# Patient Record
Sex: Male | Born: 1945
Health system: Southern US, Community
[De-identification: ages and names within clinical notes are randomized; demographics above are authoritative.]

## PROBLEM LIST (undated history)

## (undated) DIAGNOSIS — I10 Essential (primary) hypertension: Secondary | ICD-10-CM

## (undated) DIAGNOSIS — B9681 Helicobacter pylori [H. pylori] as the cause of diseases classified elsewhere: Secondary | ICD-10-CM

## (undated) DIAGNOSIS — D689 Coagulation defect, unspecified: Secondary | ICD-10-CM

## (undated) DIAGNOSIS — K222 Esophageal obstruction: Secondary | ICD-10-CM

## (undated) DIAGNOSIS — K2270 Barrett's esophagus without dysplasia: Secondary | ICD-10-CM

## (undated) DIAGNOSIS — K219 Gastro-esophageal reflux disease without esophagitis: Secondary | ICD-10-CM

## (undated) DIAGNOSIS — K2970 Gastritis, unspecified, without bleeding: Secondary | ICD-10-CM

## (undated) DIAGNOSIS — Z86010 Personal history of colonic polyps: Secondary | ICD-10-CM

## (undated) DIAGNOSIS — Z5189 Encounter for other specified aftercare: Secondary | ICD-10-CM

## (undated) DIAGNOSIS — D649 Anemia, unspecified: Secondary | ICD-10-CM

## (undated) DIAGNOSIS — Z800 Family history of malignant neoplasm of digestive organs: Secondary | ICD-10-CM

## (undated) DIAGNOSIS — K5790 Diverticulosis of intestine, part unspecified, without perforation or abscess without bleeding: Secondary | ICD-10-CM

## (undated) DIAGNOSIS — E785 Hyperlipidemia, unspecified: Secondary | ICD-10-CM

## (undated) DIAGNOSIS — C801 Malignant (primary) neoplasm, unspecified: Secondary | ICD-10-CM

## (undated) HISTORY — DX: Gastritis, unspecified, without bleeding: K29.70

## (undated) HISTORY — DX: Coagulation defect, unspecified: D68.9

## (undated) HISTORY — DX: Malignant (primary) neoplasm, unspecified: C80.1

## (undated) HISTORY — DX: Barrett's esophagus without dysplasia: K22.70

## (undated) HISTORY — DX: Hyperlipidemia, unspecified: E78.5

## (undated) HISTORY — DX: Gastro-esophageal reflux disease without esophagitis: K21.9

## (undated) HISTORY — DX: Essential (primary) hypertension: I10

## (undated) HISTORY — PX: HERNIA REPAIR: SHX51

## (undated) HISTORY — DX: Helicobacter pylori (H. pylori) as the cause of diseases classified elsewhere: B96.81

## (undated) HISTORY — PX: VASECTOMY: SHX75

## (undated) HISTORY — PX: EYE SURGERY: SHX253

## (undated) HISTORY — PX: COLON SURGERY: SHX602

## (undated) HISTORY — DX: Encounter for other specified aftercare: Z51.89

## (undated) HISTORY — DX: Diverticulosis of intestine, part unspecified, without perforation or abscess without bleeding: K57.90

## (undated) HISTORY — DX: Family history of malignant neoplasm of digestive organs: Z80.0

## (undated) HISTORY — DX: Esophageal obstruction: K22.2

## (undated) HISTORY — DX: Personal history of colonic polyps: Z86.010

---

## 2004-01-28 ENCOUNTER — Ambulatory Visit (HOSPITAL_COMMUNITY): Admission: RE | Admit: 2004-01-28 | Discharge: 2004-01-28 | Payer: Self-pay | Admitting: Internal Medicine

## 2009-02-23 ENCOUNTER — Encounter: Payer: Self-pay | Admitting: Internal Medicine

## 2009-02-28 DIAGNOSIS — Z860101 Personal history of adenomatous and serrated colon polyps: Secondary | ICD-10-CM

## 2009-02-28 DIAGNOSIS — Z86010 Personal history of colonic polyps: Secondary | ICD-10-CM

## 2009-02-28 HISTORY — DX: Personal history of colonic polyps: Z86.010

## 2009-02-28 HISTORY — DX: Personal history of adenomatous and serrated colon polyps: Z86.0101

## 2009-03-02 ENCOUNTER — Ambulatory Visit (HOSPITAL_COMMUNITY): Admission: RE | Admit: 2009-03-02 | Discharge: 2009-03-02 | Payer: Self-pay | Admitting: Internal Medicine

## 2009-03-02 ENCOUNTER — Encounter: Payer: Self-pay | Admitting: Internal Medicine

## 2009-03-02 ENCOUNTER — Ambulatory Visit: Payer: Self-pay | Admitting: Internal Medicine

## 2009-03-03 ENCOUNTER — Encounter: Payer: Self-pay | Admitting: Internal Medicine

## 2009-11-28 DIAGNOSIS — K222 Esophageal obstruction: Secondary | ICD-10-CM

## 2009-11-28 DIAGNOSIS — B9681 Helicobacter pylori [H. pylori] as the cause of diseases classified elsewhere: Secondary | ICD-10-CM

## 2009-11-28 DIAGNOSIS — K5790 Diverticulosis of intestine, part unspecified, without perforation or abscess without bleeding: Secondary | ICD-10-CM

## 2009-11-28 DIAGNOSIS — K2270 Barrett's esophagus without dysplasia: Secondary | ICD-10-CM

## 2009-11-28 HISTORY — DX: Esophageal obstruction: K22.2

## 2009-11-28 HISTORY — DX: Barrett's esophagus without dysplasia: K22.70

## 2009-11-28 HISTORY — DX: Helicobacter pylori (H. pylori) as the cause of diseases classified elsewhere: B96.81

## 2009-11-28 HISTORY — DX: Diverticulosis of intestine, part unspecified, without perforation or abscess without bleeding: K57.90

## 2009-12-15 ENCOUNTER — Encounter: Payer: Self-pay | Admitting: Internal Medicine

## 2009-12-16 ENCOUNTER — Ambulatory Visit (HOSPITAL_COMMUNITY): Admission: RE | Admit: 2009-12-16 | Discharge: 2009-12-16 | Payer: Self-pay | Admitting: Internal Medicine

## 2009-12-16 ENCOUNTER — Telehealth (INDEPENDENT_AMBULATORY_CARE_PROVIDER_SITE_OTHER): Payer: Self-pay | Admitting: *Deleted

## 2009-12-16 ENCOUNTER — Ambulatory Visit: Payer: Self-pay | Admitting: Internal Medicine

## 2009-12-21 ENCOUNTER — Encounter (INDEPENDENT_AMBULATORY_CARE_PROVIDER_SITE_OTHER): Payer: Self-pay

## 2009-12-22 ENCOUNTER — Telehealth (INDEPENDENT_AMBULATORY_CARE_PROVIDER_SITE_OTHER): Payer: Self-pay

## 2009-12-22 ENCOUNTER — Encounter: Payer: Self-pay | Admitting: Internal Medicine

## 2009-12-22 DIAGNOSIS — K2270 Barrett's esophagus without dysplasia: Secondary | ICD-10-CM | POA: Insufficient documentation

## 2009-12-22 DIAGNOSIS — A048 Other specified bacterial intestinal infections: Secondary | ICD-10-CM | POA: Insufficient documentation

## 2010-01-24 ENCOUNTER — Encounter: Payer: Self-pay | Admitting: Internal Medicine

## 2010-02-14 ENCOUNTER — Ambulatory Visit: Payer: Self-pay | Admitting: Internal Medicine

## 2010-04-05 ENCOUNTER — Encounter: Payer: Self-pay | Admitting: Gastroenterology

## 2010-04-06 ENCOUNTER — Encounter: Payer: Self-pay | Admitting: Gastroenterology

## 2010-08-30 NOTE — Medication Information (Signed)
Summary: NEXIUM 40MG   NEXIUM 40MG    Imported By: Rexene Alberts 04/05/2010 08:53:19  _____________________________________________________________________  External Attachment:    Type:   Image     Comment:   External Document  Appended Document: NEXIUM 40MG , change to prevacid    Prescriptions: LANSOPRAZOLE 30 MG CPDR (LANSOPRAZOLE) one by mouth 30 mins before breakfast daily  #30 x 11   Entered and Authorized by:   Leanna Battles. Dixon Boos   Signed by:   Leanna Battles Dixon Boos on 04/05/2010   Method used:   Electronically to        [facility] (retail)       [address]       [place], [place]  [address]       Ph: [phone]       Fax: [phone]   RxID:   [id number]

## 2010-08-30 NOTE — Progress Notes (Signed)
Summary: Patient's wife wanted to communicate cell phone info/MM  Phone Note Call from Patient Call back at Home Phone [phone]   Caller: Spouse Details for Reason: returning information requested by Dr. Jena Gauss Summary of Call: Ms. Littlefield called with the two numbers you requested as they would be out of town and she wanted me to pass them along to you. His cell: [phone] Her cell: [phone] Initial call taken by: Minna Merritts,  Dec 16, 2009 4:32 PM

## 2010-08-30 NOTE — Letter (Signed)
Summary: EGD ORDER  EGD ORDER   Imported By: Ave Filter 12/15/2009 10:01:10  _____________________________________________________________________  External Attachment:    Type:   Image     Comment:   External Document

## 2010-08-30 NOTE — Miscellaneous (Signed)
Summary: path report  Clinical Lists Changes SP-Surgical Pathology - STATUS: Final  .                                         Perform Date: 3 Aug10 00:01  Ordered By: Jena Gauss MD , Gerrit Friends           Ordered Date: 3 Aug10 13:36  Facility: [facility]                               Department: [facility]  Service Report Text  [address] [place], [place] [address]   [phone]    REPORT OF SURGICAL PATHOLOGY    Case #: [id number]   Patient Name: David Weiss, David B.   Office Chart Number: N/A    MRN: [id number]   Pathologist: Lyn Hollingshead. Delila Spence, MD   DOB/Age [birth year] (Age: 65) Gender: M   Date Taken: 03/02/2009   Date Received: 03/02/2009    FINAL DIAGNOSIS    ***MICROSCOPIC EXAMINATION AND DIAGNOSIS***    1. ASCENDING COLON, POLYP(S): ADENOMATOUS POLYP(S). NO HIGH   GRADE DYSPLASIA OR INVASIVE MALIGNANCY IDENTIFIED (4 FRAGMENTS).    2. CECUM, POLYP: ADENOMATOUS POLYP. NO HIGH GRADE DYSPLASIA OR   INVASIVE MALIGNANCY IDENTIFIED.    3. DESCENDING COLON, POLYP(S): ADENOMATOUS POLYP(S). NO HIGH   GRADE DYSPLASIA OR INVASIVE MALIGNANCY IDENTIFIED.    mw   Date Reported: 03/03/2009 Alden Server A. Delila Spence, MD   *** Electronically Signed Out By EAA ***    Clinical information   Polyps (km)    specimen(s) obtained   1: Colon, polyp(s), ascending   2: Colon, polyp(s), cecal   3: Colon, polyp(s), descending    Gross Description   1. Received in formalin are tan, soft tissue fragments that are   submitted in toto. Number: 4   Size: 0.2 to 0.4 cm    2. Received in formalin is a tan, soft tissue fragment that is   submitted in toto. Size: 0.3 cm    3. Received in formalin are tan, soft tissue fragments that are   submitted in toto. Number: 2   Size: 0.1 and 0.2 cm (GP:mj 03/02/09)    mj/    COLPOL-Colon, polyp(s), ascending   COLPOL-Colon, polyp(s), cecal   COLPOL-Colon, polyp(s), descending   1. Received in formalin are tan, soft tissue fragments that are   submitted in toto. Number: 4  Size: 0.2 to 0.4 cm    2. Received in formalin is a tan, soft tissue fragment that is   submitted in toto. Size: 0.3 cm    3. Received in formalin are tan, soft tissue fragments that are   submitted in toto. Number: 2   Size: 0.1 and 0.2 cm (GP:mj 03/02/09)   1. ASCENDING COLON, POLYP(S): ADENOMATOUS POLYP(S). NO HIGH   GRADE DYSPLASIA OR INVASIVE MALIGNANCY IDENTIFIED (4 FRAGMENTS).    2. CECUM, POLYP: ADENOMATOUS POLYP. NO HIGH GRADE DYSPLASIA OR   INVASIVE MALIGNANCY IDENTIFIED.    3. DESCENDING COLON, POLYP(S): ADENOMATOUS POLYP(S). NO HIGH   GRADE DYSPLASIA OR INVASIVE MALIGNANCY IDENTIFIED.  Additional Information  HL7 RESULT STATUS : F  External IF Update Timestamp : 2009-03-02:13:36:00.000000

## 2010-08-30 NOTE — Progress Notes (Signed)
Summary: hpylori rx  Phone Note Other Incoming   Caller: RMR Summary of Call: per RMR pt has hpylori and barretts esp. pt needs prevpac x 14 days (can break into separate rx's of biaxin, amox and prevacid). RMR has already informed pt and asked him to stop his protonix during treatment. Rx called into [facility] to [place], [phone][place] per RMR pt informed him that he was taking lotrel and lipitor and has NKDA. pt needs ov in 01/2010 and repeat egd in 1 year. Initial call taken by: Hendricks Limes LPN,  Dec 22, 2009 4:23 PM     Appended Document: hpylori rx pt aware of July appt and reminder in computer for procedure

## 2010-08-30 NOTE — Medication Information (Signed)
Summary: OMEPRAZOLE 20MG   OMEPRAZOLE 20MG    Imported By: Rexene Alberts 04/06/2010 08:10:47  _____________________________________________________________________  External Attachment:    Type:   Image     Comment:   External Document  Appended Document: omeprazole    Prescriptions: OMEPRAZOLE 20 MG CPDR (OMEPRAZOLE) one by mouth 30 mins before breakfast daily  #30 x 11   Entered and Authorized by:   Leanna Battles. Dixon Boos   Signed by:   Leanna Battles Dixon Boos on 04/08/2010   Method used:   Electronically to        [facility] (retail)       [address]       [place], [place]  [address]       Ph: [phone]       Fax: [phone]   RxID:   [id number]    PLEASE MAKE SURE PATIENT HAS BEEN NIC FOR REPEAT EGD 5/12

## 2010-08-30 NOTE — Letter (Signed)
Summary: [facility]  [facility]   Imported By: Ave Filter 12/15/2009 10:01:59  _____________________________________________________________________  External Attachment:    Type:   Image     Comment:   External Document

## 2010-08-30 NOTE — Assessment & Plan Note (Signed)
Summary: pp fu in July per RMR/ss   Visit Type:  Follow-up Visit Primary Care Provider:  Ouida Sills  Chief Complaint:  F/U .  History of Present Illness: Patient is here for f/u. Recent EGD (5/11) showed Schatzki ring, H. Pylori gastritis, Barrett's esophagus. He completed treatment for H.Pylori. He has been on pantoprazole since EGD in 5/11. He states he has had diffuse itching and rash of upper torso for the past several weeks. Only new meds were pantoprazole, biaxin, amoxicillin. Abx completed over one month ago. He denies heartburn. Rarely had it in past, would take prevacid OTC as needed only. He also c/o worsening gout and brings article stating gout can be flared by pantoprazole. Denies dysphagia, abd pain, constipation, diarrhea, melena, brbpr.  Current Medications (verified): 1)  Hydroxyzine Hcl 25 Mg Tabs (Hydroxyzine Hcl) .... As Needed 2)  Lipitor 20 Mg Tabs (Atorvastatin Calcium) .... 1/2 Tablet Daily 3)  Amlodipine Besy-Benazepril Hcl 5-10 Mg Caps (Amlodipine Besy-Benazepril Hcl) .... Take 1 Tablet By Mouth Once A Day 4)  Clobetasol Propionate 0.05 % Crea (Clobetasol Propionate) .... As Directed 5)  Claritin 10 Mg Tabs (Loratadine) .... As Needed 6)  Vitamin D .... Take 1 Tablet By Mouth Once A Day 7)  Fish Oil 1200 Mg .... Take 1 Tablet By Mouth Once A Day  Allergies (verified): 1)  ! Pantoprazole Sodium  Past History:  Past Medical History: GERD Hyperlipidemia Hypertension EGD 5/11, done for drop in Hgb. Bx proven Barrett's. Schatzki ring s/p dilation, multiple antral bulbar and D2 erosions (likely source of bleeding in the setting of multiple NSAID drug use), bx showed H. Pylori (treated) TCS 8/10--> Left-sided diverticula. Adenomatous polyps.  Review of Systems      See HPI  Vital Signs:  Patient profile:   65 year old male Height:      69 inches Weight:      183.50 pounds BMI:     27.20 Temp:     98.1 degrees F oral Pulse rate:   64 / minute BP sitting:   142  / 58  (left arm) Cuff size:   regular  Vitals Entered By: Cloria Spring LPN (February 14, 2010 10:08 AM)  Physical Exam  General:  Well developed, well nourished, no acute distress. Head:  Normocephalic and atraumatic. Eyes:  sclera nonicteric Mouth:  op moist Abdomen:  soft Neurologic:  Alert and  oriented x4;  grossly normal neurologically. Skin:  Intact without significant lesions or rashes. Psych:  Alert and cooperative. Normal mood and affect.  Impression & Recommendations:  Problem # 1:  HELICOBACTER PYLORI GASTRITIS (ICD-041.86)  Completed treatment.  Orders: Est. Patient Level II (40102)  Problem # 2:  BARRETTS ESOPHAGUS (ICD-530.85)  Written information provided. Answered all questions. Stressed importance of daily PPI even though he is assymptomatic. Stop pantoprazole due to suspected adverse reaction. Begin Nexium 40 mg by mouth daily in one week. Samples and RX provided. If itching does not clear up, he should let PCP know. EGD in 11/2010 for surveillance of Barrett's esophagus.   Orders: Est. Patient Level II (72536) Prescriptions: NEXIUM 40 MG CPDR (ESOMEPRAZOLE MAGNESIUM) one by mouth 30 mins before breakfast daily  #30 x 11   Entered and Authorized by:   Leanna Battles. Dixon Boos   Signed by:   Leanna Battles Lewis PA-C on 02/14/2010   Method used:   Print then Give to Patient   RxID:   [id number]

## 2010-12-13 NOTE — Op Note (Signed)
NAME:  David Weiss, David Weiss                 ACCOUNT NO.:  [id number]   MEDICAL RECORD NO.:  [id number]          PATIENT TYPE:  AMB   LOCATION:  DAY                           FACILITY:  [facility]   PHYSICIAN:  R. Roetta Sessions, M.D. DATE OF BIRTH:  May 03, 1946   DATE OF PROCEDURE:  DATE OF DISCHARGE:                               OPERATIVE REPORT   PROCEDURES:  Colonoscopy with biopsy and snare polypectomy.   INDICATIONS FOR PROCEDURE:  A 65 year old gentleman with positive family  history of colon cancer in first degree relative at a young age as well  as a personal history of colonic adenomas removed at colonoscopy 5 years  ago.  He is devoid of any lower GI tract symptoms.  He is here for  surveillance.  Risks, benefits, alternatives and limitations have been  discussed, questions answered.  Please see the documention in medical  record.   PROCEDURE NOTE:  O2 saturation, blood pressure, pulse and respirations  monitored through the entire procedure.  Conscious sedation Versed 5 mg  IV Demerol 100 grams IV in divided doses.   INSTRUMENT USED:  Pentax video chip system.   FINDINGS:  Digital rectal exam revealed no abnormalities.  The prep was  good colon:  Colonic mucosa was surveyed from the rectosigmoid junction  through the left transverse right colon, the appendiceal orifice,  ileocecal valve and cecum.  These structures well seen and photographed  for the record.  From this level the scope was slowly withdrawn.  The  previously mentioned mucosal surfaces were again seen.  The patient had  left-sided diverticula.  In the mid ascending segment, There was a 5-mm  pedunculated polyp.  It was cold snared removed.  In the base the cecum  there was a single diminutive polyps which was cold biopsied/removed.  In the mid descending segment there was a another single diminutive  polyp which was cold biopsied/removed.  The remainder of colonic mucosa  appeared unremarkable.  Scope was pulled down the  rectum where thorough  examination of the rectal mucosa including retroflexion in the anal  verge demonstrated no abnormality.  The patient tolerated the rest of  procedure well and reacted in endoscopy.  Cecal withdrawal time 11  minutes.   IMPRESSION:  1. Normal rectum.  2. Left-sided diverticula.  3,  Single diminutive polyp in the descending colon as well as the  cecum.  Cold biopsied/removed.  1. Pedunculated polyp mid ascending colon status post cold snare      removal.  2. Remainder colonic mucosa appeared unremarkable.  3. Normal rectum.   RECOMMENDATIONS:  1. Diverticulosis polyp literature provided to Mr. Frigon.  2. Follow-up on path.  3. Further recommendations to follow.      [name], M.D.  Electronically Signed     RMR/MEDQ  D:  03/02/2009  T:  03/02/2009  Job:  [id number]   cc:   Kingsley Callander. Ouida Sills, MD  Fax: [phone]

## 2010-12-16 NOTE — Op Note (Signed)
NAME:  Rayborn, Montre B                           ACCOUNT NO.:  [id number]   MEDICAL RECORD NO.:  [id number]                   PATIENT TYPE:  AMB   LOCATION:  DAY                                  FACILITY:  [facility]   PHYSICIAN:  R. Roetta Sessions, M.D.              DATE OF BIRTH:  1946/02/07   DATE OF PROCEDURE:  01/28/2004  DATE OF DISCHARGE:                                 OPERATIVE REPORT   PROCEDURES:  1. Colonoscopy with biopsy.  2. Snare polypectomy.   INDICATIONS FOR PROCEDURE:  Patient is a 65 year old gentleman sent over at  the courtesy of Dr. Carylon Perches for colorectal cancer screening.  He has had a  sigmoidoscopy approximately 5 years ago without significant findings.  He is  devoid of any GI tract symptoms.  He has never had a full colonoscopy.  His  family history is now significant in that his brother had what he describes  as a transanal resection for limited stage rectal cancer down at [facility], [place].  He is doing well.  The  colonoscopy is now being done as a high risk screening maneuver.  The  approach has been discussed with the patient at length.  The potential  risks, benefits and alternatives have been reviewed, questions answered.  He  is agreeable.  Please see my handwritten H&P.   PROCEDURE NOTE:  The O2 saturation, blood pressure, pulse and respirations  were monitored throughout the entire procedure.   CONSCIOUS SEDATION:  1. Versed 5 mg IV.  2. Demerol 75 IV in divided doses.   INSTRUMENT:  Olympus video chip system.   FINDINGS:  Digital rectal examination revealed no abnormalities.   ENDOSCOPIC FINDINGS:  Prep was good.   Rectum:  Examination of the rectal mucosa including retroflexed view of the  anal verge revealed no abnormalities.   Colon:  Colonic mucosa was surveyed from the rectosigmoid junction through  the left transverse and right colon to the appendiceal orifice, ileocecal  valve and cecum.   These structures were well seen and photographed for the  record.  From this level the scope was slowly withdrawn.  All previously  mentioned mucosal surfaces were again seen.  The patient was noted to have:  1. A fairly extensive left-sided diverticula.  2. A 6 mm pedunculated polyp at 45 cm, removed with a cold snare.  There was     a second 3 mm polyp at 35 cm which was cold biopsied.  These polyps were     recovered through the scope.  The remainder of the colonic mucosa     appeared normal.  The patient tolerated the procedure well, was reactive     to endoscopy.   IMPRESSION:  1. Normal rectum.  2. Left-sided diverticula, small polyps in the left colon removed as     described above.  Remainder of colonic mucosa  appeared normal.   RECOMMENDATIONS:  1. Diverticulosis literature provided to Mr. Chaikin.  2. Followup on path.  3. Further recommendations to follow.      ___________________________________________                                            [name], M.D.   RMR/MEDQ  D:  01/28/2004  T:  01/28/2004  Job:  [id number]   cc:   Kingsley Callander. Ouida Sills, M.D.  [address]  [place]  [place] [address]  Fax: [phone]

## 2010-12-28 ENCOUNTER — Encounter: Payer: Self-pay | Admitting: Urgent Care

## 2011-01-03 ENCOUNTER — Encounter: Payer: Self-pay | Admitting: Urgent Care

## 2011-01-03 ENCOUNTER — Ambulatory Visit (INDEPENDENT_AMBULATORY_CARE_PROVIDER_SITE_OTHER): Payer: Medicare Other | Admitting: Urgent Care

## 2011-01-03 VITALS — BP 134/65 | HR 83 | Temp 98.4°F | Ht 68.0 in | Wt 187.2 lb

## 2011-01-03 DIAGNOSIS — A048 Other specified bacterial intestinal infections: Secondary | ICD-10-CM

## 2011-01-03 DIAGNOSIS — K2270 Barrett's esophagus without dysplasia: Secondary | ICD-10-CM

## 2011-01-03 DIAGNOSIS — D126 Benign neoplasm of colon, unspecified: Secondary | ICD-10-CM

## 2011-01-03 DIAGNOSIS — K222 Esophageal obstruction: Secondary | ICD-10-CM

## 2011-01-03 DIAGNOSIS — Q393 Congenital stenosis and stricture of esophagus: Secondary | ICD-10-CM

## 2011-01-03 DIAGNOSIS — Z800 Family history of malignant neoplasm of digestive organs: Secondary | ICD-10-CM | POA: Insufficient documentation

## 2011-01-03 NOTE — Patient Instructions (Signed)
Continue omeprazole 20mg   Colonoscopy 02/2014 or sooner if any problems  Barrett's Esophagus The esophagus is the muscular tube that carries food and saliva from the mouth to the stomach. Barrett's esophagus involves changes in the esophagus. Some of its lining is replaced by a type of tissue similar to that found in the intestine. This process is called intestinal metaplasia. While Barrett's esophagus may cause no symptoms itself, a small number of people with this condition develop a relatively rare but often deadly type of cancer of the esophagus. It is called esophageal adenocarcinoma. Barrett's esophagus is associated with the common condition called GERD (gastroesophageal reflux disease). HOW THE ESOPHAGUS WORKS The esophagus carries food, liquids, and saliva from the mouth to the stomach. The stomach acts as a container to start digestion and pump food and liquids into the intestines in a controlled process. Food can then be properly digested over time. Nutrients can be taken in (absorbed) by the intestines. The esophagus moves food to the stomach by coordinated contractions of its muscular lining. This process is automatic. People are usually not aware of it. Many people have felt their esophagus when they:  Swallow something too large.   Try to eat too quickly.   Drink very hot or cold liquids.  They then feel the movement of the food or drink down the esophagus into the stomach. This may be an uncomfortable feeling. DIGESTIVE TRACT The muscular layers of the esophagus are normally pinched together at both the upper and lower ends by muscles. These muscles are called sphincters. When a person swallows, the sphincters relax automatically. This allows food or drink to pass from the mouth, into the stomach. The muscles then close rapidly. This prevents the swallowed food or drink from leaking out of the stomach, back into the esophagus or into the mouth. These muscles make it possible to swallow  while lying down or even upside-down. When people belch to release swallowed air or gas from carbonated beverages, the sphincters relax. Then small amounts of food or drink may come back up, briefly. This condition is called reflux. The esophagus quickly squeezes the material back into the stomach. This is considered normal. These functions of the esophagus are an important part of everyday life. However, people who must have their esophagus removed, for example because of cancer, can live a relatively healthy life without it. GERD (GASTROESOPHAGEAL REFLUX DISEASE) Having some stomach contents (liquids or gas) sometimes reflux (come back up from the stomach into the esophagus) is considered normal. When it happens often, and causes other symptoms, it is considered a medical problem or disease. However, it is not necessarily a serious one or one that requires seeing a caregiver. The stomach produces acid and enzymes to digest food. When this mixture refluxes into the esophagus more often than normal or for a longer period of time than normal, it may produce symptoms. These symptoms are often called acid reflux. They are often described by people as heartburn, indigestion, or "gas". The symptoms often consist of a burning sensation below and behind the lower part of the breastbone or sternum. Almost everyone has experienced these symptoms at least once. This is typically a result of overeating. Other things that provoke GERD symptoms include:  Being overweight.   Eating certain types of foods.   Being pregnant.  In most people, GERD symptoms may last only a short time and require no treatment at all.  More continual symptoms are often quickly relieved by over-the-counter acid-reducing agents, such  as antacids. Other drugs used to relieve GERD symptoms are antisecretory drugs, such as histamine2 (H2) blockers or proton pump inhibitors. People who have symptoms often should talk with their caregiver.  Other diseases can have similar symptoms. Prescription medicines, together with other actions, might be needed to reduce reflux. GERD that is untreated over a long period can lead to problems. An example is an ulcer in the esophagus, that could cause bleeding. Another common problem is scar tissue that blocks the movement of swallowed food and drink through the esophagus. This condition is called stricture. Esophageal reflux may also cause certain less common symptoms. These include hoarseness or chronic cough. It sometimes provokes conditions such as asthma. While most patients find that lifestyle changes and acid-blocking drugs relieve their symptoms, caregivers sometimes advise surgery. Overall, GERD is one of the most common medical conditions. About 20 percent of the population can be affected over a lifetime. GERD AND BARRETT'S ESOPHAGUS The exact causes of Barrett's esophagus are not known. It is thought to be caused in part by the same factors that cause GERD. People who do not have heartburn can have Barrett's esophagus. However, it is found about 3 to 5 times more often in people with this condition. Barrett's esophagus is uncommon in children. The average age at diagnosis is 69. But it is usually difficult to know when the problem started. It is about twice as common in men as in women. It is much more common in white men than in men of other races. BARRETT'S ESOPHAGUS AND CANCER OF THE ESOPHAGUS Barrett's esophagus does not cause symptoms itself. However, it seems to precede the development of a particular kind of cancer. This cancer is esophageal adenocarcinoma. The risk of developing this cancer is 30 to 125 times higher in people who have Barrett's esophagus than in people who do not. This type of cancer is increasing quickly in white men. The increase is possibly related to the rise in obesity and GERD. For people who have Barrett's esophagus, the risk of getting cancer of the esophagus is  small. It is less than 1 percent (0.4 percent to 0.5 percent) per year. Esophageal adenocarcinoma is often not curable. This is partly because the disease is often discovered at a late stage and treatments are not effective. DIAGNOSIS (HOW TO TELL WHAT IS WRONG) AND SCREENING Diagnosing Barrett's esophagus is not easy. At the present time it cannot be diagnosed based on symptoms, physical exam, or blood tests. The only useful test is upper gastrointestinal endoscopy and biopsy. In this procedure, a flexible tube called an endoscope is used. This tool is like a pencil sized flexible telescope. It has a light and tiny camera. It is passed into the esophagus. If the tissue appears suspicious to your caregiver, biopsies must be done. A biopsy is the removal of a small piece of tissue. This is done using a pincher-like device passed through the endoscope. A pathologist is a specialist who examines body tissue samples. He or she examines the tissue under a microscope to confirm the diagnosis. Looking for a medical problem in people who do not know whether they have one is called screening. Currently, there are no commonly accepted guidelines on who should have endoscopy, to check for Barrett's esophagus. There are many reasons for the lack of firm recommendations about screening. Among them are the great cost and occasional risk of side effects of the test. Also, the rate of finding Barrett's esophagus is low. Finding the problem early has  not been proven to prevent deaths from cancer. Many caregivers advise that adult patients who are over the age of 47 and have had GERD symptoms for a number of years have endoscopy, to see whether they have Barrett's esophagus. Screening for this condition in people who have no symptoms is not advised. TREATMENT Barrett's esophagus has no cure, other than surgical removal of the esophagus. This is a serious operation. Surgery is advised only for people who have a high risk of  developing cancer or who already have it. Most caregivers recommend treating GERD with acid-blocking drugs. This is sometimes linked to improvement in the extent of the Barrett's tissue. But this approach has not been proven to reduce the risk of cancer. Treating reflux with surgery for GERD also does not seem to cure Barrett's esophagus. Several experimental approaches are under study. One attempts to see whether destroying the Barrett's tissue by heat or other means, through an endoscope, can get rid of the condition. But this approach has potential risks and unknown effectiveness. LOOKING FOR DYSPLASIA AND CANCER Occasional endoscopic examinations to look for early warning signs of cancer are generally advised for people who have Barrett's esophagus. This approach is called surveillance. When people who have Barrett's esophagus develop cancer, the process seems to go through an intermediate stage. In this stage cancer cells appear in the Barrett's tissue. This condition is called dysplasia. It can be seen only in biopsies with a microscope. The process is patchy and cannot be seen directly through the endoscope. So, multiple biopsies must be taken. Even then, it can be missed. The process of change from Barrett's to cancer seems to happen only in a few patients. It is less than 1 percent per year. And it happens over a relatively long period of time. Most caregivers advise that patients with Barrett's esophagus undergo occasional endoscopy to have biopsies. The recommended time between endoscopies varies depending on circumstances. The best time interval has not been decided. TREATMENT FOR DYSPLASIA OR ESOPHAGEAL ADENOCARCINOMA If a person with Barrett's esophagus is found to have dysplasia or cancer, the caregiver will usually recommend surgery. This is if the person is strong enough and has a good chance of being cured. The type of surgery may vary. But it usually involves removing most of the esophagus  and pulling the stomach up into the chest to attach it to what remains of the esophagus. Many patients with Barrett's esophagus are elderly. They may have many other medical problems that make surgery unwise. In these patients, other methods to treat dysplasia are being studied. HOPE THROUGH RESEARCH Many important questions about Barrett's esophagus need further research to:  Find better ways to identify people who have the problem.   Find out what causes it.   Test treatments that may prevent or get rid of it.   Find better treatments for people who have Barrett's esophagus with cancer.  The [employer], and the [employer], sponsor research programs to study Barrett's esophagus. IMPORTANT POINTS TO REMEMBER  In Barrett's esophagus, the cells lining the esophagus change. They become similar to the cells lining the intestine.   Barrett's esophagus is connected with gastroesophageal reflux disease or GERD.   A small number of people with Barrett's esophagus may develop esophageal cancer.   Barrett's esophagus is diagnosed by upper gastrointestinal endoscopy and biopsy.   People who have Barrett's esophagus should have periodic esophageal exams.   Taking acid-blocking drugs for GERD  may help improve Barrett's esophagus.   Removal of the esophagus is recommended only for people who have a high risk of developing cancer or who already have it.  ADDITIONAL RESOURCES For more information about GERD or Barrett's esophagus, contact: [employer] [address] [place], [place] [address] Phone: [phone] or [phone] Fax: [phone] Email: iffgd@iffgd .org Internet: www.iffgd.[facility] [employer] Information Clearinghouse [address] [place], [place] [address] Email: nddic@info .[employer].si Document Released: 10/07/2003 Document  Re-Released: 01/04/2010 [facility] Patient Information 2011 [place], [place] daily

## 2011-01-03 NOTE — Assessment & Plan Note (Signed)
Due for surveillance.  On omeprazole 20mg  daily.  Doing well.  EGD w/ biopsies w/ Dr Jena Gauss.  I have discussed risks & benefits which include, but are not limited to, bleeding, infection, perforation & drug reaction.  The patient agrees with this plan & written consent will be obtained.

## 2011-01-03 NOTE — Assessment & Plan Note (Signed)
Next colonoscopy 02/2014

## 2011-01-03 NOTE — Progress Notes (Signed)
Primary Care Physician:  Carylon Perches, MD Primary Gastroenterologist:  Dr. Jena Gauss  Chief Complaint  Patient presents with  . EGD    Barrett's esophagus    HPI:  David Weiss is a 65 y.o. male here for follow up for Barrett's esophagus.  Hx bx proven Barrett's diagnosed on EGD by Dr Jena Gauss 5/11 for GI bleed.  Findings included h pylori & NSAID-induced gastritis/duodenitis s/p treatment.  Doing very well.  Denies any upper GI symptoms including heartburn, indigestion, nausea, vomiting, dysphagia, odynophagia or anorexia.   Denies any lower GI symptoms including constipation, diarrhea, rectal bleeding, melena or weight loss.  On omeprazole 20mg  daily. Past Medical History  Diagnosis Date  . Barrett's esophagus 5/11    EGD Dr. Edgardo Roys Barrett's, multiple antral/bulbar erosins, D2 erosion sec NSAIDs  . GERD (gastroesophageal reflux disease)   . HTN (hypertension)   . Hyperlipemia   . Helicobacter pylori gastritis 5/11  . Diverticulosis 5/11    Colonoscopy Dr Jena Gauss  . Hx of adenomatous colonic polyps 5/11    Colonoscopy Dr. Jena Gauss  . Family hx of colon cancer     No past surgical history on file. Denies any.  Current Outpatient Prescriptions  Medication Sig Dispense Refill  . amLODipine (NORVASC) 5 MG tablet Take 5 mg by mouth daily.        . benazepril (LOTENSIN) 10 MG tablet Take 10 mg by mouth daily.        . Cholecalciferol (VITAMIN D3) 400 UNITS CAPS Take 3 capsules by mouth daily.        . fish oil-omega-3 fatty acids 1000 MG capsule Take 1 g by mouth daily.        Marland Kitchen LIPITOR 20 MG tablet 20 mg. 1/2 pill a day      . omeprazole (PRILOSEC) 20 MG capsule Take 20 mg by mouth daily.       Marland Kitchen DISCONTD: citalopram (CELEXA) 20 MG tablet         Allergies as of 01/03/2011 - Review Complete 01/03/2011  Allergen Reaction Noted  . Pantoprazole sodium  02/14/2010    Family History:   Problem Relation Age of Onset  . Prostate cancer Father 36  . Diabetes Mother   . Heart failure  Mother   . Colon cancer Brother 1    History   Social History  . Marital Status: Married    Spouse Name: N/A    Number of Children: 2  . Years of Education: N/A   Occupational History  . retired     Airline pilot   Social History Main Topics  . Smoking status: Former Smoker -- 1.5 packs/day for 30 years    Types: Cigarettes    Quit date: 03/18/1981  . Smokeless tobacco: Not on file  . Alcohol Use: Yes     two drinks a day bourbon  . Drug Use: No  . Sexually Active: Not on file   Other Topics Concern  . Not on file   Social History Narrative   Lives w/ wife    Review of Systems: Gen: Denies any fever, chills, sweats, anorexia, fatigue, weakness, malaise, weight loss, and sleep disorder CV: Denies chest pain, angina, palpitations, syncope, orthopnea, PND, peripheral edema, and claudication. Resp: Denies dyspnea at rest, dyspnea with exercise, cough, sputum, wheezing, coughing up blood, and pleurisy. GI: Denies vomiting blood, jaundice, and fecal incontinence.   Denies dysphagia or odynophagia. Derm: Denies rash, itching, dry skin, hives, moles, warts, or unhealing ulcers.  Psych: Denies depression, anxiety,  memory loss, suicidal ideation, hallucinations, paranoia, and confusion. Heme: Denies bruising, bleeding, and enlarged lymph nodes.  Physical Exam: BP 134/65  Pulse 83  Temp(Src) 98.4 F (36.9 C) (Temporal)  Ht 5\' 8"  (1.727 m)  Wt 187 lb 3.2 oz (84.913 kg)  BMI 28.46 kg/m2 General:   Alert,  Well-developed, well-nourished, pleasant and cooperative in NAD Head:  Normocephalic and atraumatic. Eyes:  Sclera clear, no icterus.   Conjunctiva pink. Mouth:  No deformity or lesions, dentition normal. Neck:  Supple; no masses or thyromegaly. Heart:  Regular rate and rhythm; no murmurs, clicks, rubs,  or gallops. Abdomen:  Soft, nontender and nondistended. No masses, hepatosplenomegaly or hernias noted. Normal bowel sounds, without guarding, and without rebound.   Msk:   Symmetrical without gross deformities. Normal posture. Pulses:  Normal pulses noted. Extremities:  Without clubbing or edema. Neurologic:  Alert and  oriented x4;  grossly normal neurologically. Skin:  Intact without significant lesions or rashes. Cervical Nodes:  No significant cervical adenopathy. Psych:  Alert and cooperative. Normal mood and affect.    '

## 2011-01-04 NOTE — Progress Notes (Signed)
Cc to PCP 

## 2011-01-06 ENCOUNTER — Ambulatory Visit: Payer: Self-pay | Admitting: Urgent Care

## 2011-01-06 ENCOUNTER — Ambulatory Visit (HOSPITAL_COMMUNITY)
Admission: RE | Admit: 2011-01-06 | Discharge: 2011-01-06 | Disposition: A | Discharge status: Home / Self Care | Payer: Medicare Other | Source: Ambulatory Visit | Attending: Internal Medicine | Admitting: Internal Medicine

## 2011-01-06 ENCOUNTER — Encounter: Payer: Medicare Other | Admitting: Internal Medicine

## 2011-01-06 ENCOUNTER — Other Ambulatory Visit: Payer: Self-pay | Admitting: Internal Medicine

## 2011-01-06 DIAGNOSIS — Z09 Encounter for follow-up examination after completed treatment for conditions other than malignant neoplasm: Secondary | ICD-10-CM | POA: Insufficient documentation

## 2011-01-06 DIAGNOSIS — K449 Diaphragmatic hernia without obstruction or gangrene: Secondary | ICD-10-CM

## 2011-01-06 DIAGNOSIS — K2270 Barrett's esophagus without dysplasia: Secondary | ICD-10-CM | POA: Insufficient documentation

## 2011-01-06 DIAGNOSIS — Z79899 Other long term (current) drug therapy: Secondary | ICD-10-CM | POA: Insufficient documentation

## 2011-01-06 DIAGNOSIS — Z860100 Personal history of colon polyps, unspecified: Secondary | ICD-10-CM | POA: Insufficient documentation

## 2011-01-06 DIAGNOSIS — I10 Essential (primary) hypertension: Secondary | ICD-10-CM | POA: Insufficient documentation

## 2011-01-06 DIAGNOSIS — E785 Hyperlipidemia, unspecified: Secondary | ICD-10-CM | POA: Insufficient documentation

## 2011-01-13 ENCOUNTER — Ambulatory Visit: Payer: Self-pay | Admitting: Urgent Care

## 2011-02-13 ENCOUNTER — Encounter: Payer: BLUE CROSS/BLUE SHIELD | Admitting: Internal Medicine

## 2011-02-13 ENCOUNTER — Ambulatory Visit (HOSPITAL_COMMUNITY)
Admission: RE | Admit: 2011-02-13 | Discharge: 2011-02-13 | Disposition: A | Discharge status: Home / Self Care | Payer: Medicare Other | Source: Ambulatory Visit | Attending: Internal Medicine | Admitting: Internal Medicine

## 2011-02-13 ENCOUNTER — Telehealth: Payer: Self-pay | Admitting: Gastroenterology

## 2011-02-13 ENCOUNTER — Encounter (HOSPITAL_COMMUNITY)
Admission: RE | Disposition: A | Discharge status: Home / Self Care | Payer: Self-pay | Source: Ambulatory Visit | Attending: Internal Medicine

## 2011-02-13 DIAGNOSIS — K2270 Barrett's esophagus without dysplasia: Secondary | ICD-10-CM | POA: Insufficient documentation

## 2011-02-13 DIAGNOSIS — Z79899 Other long term (current) drug therapy: Secondary | ICD-10-CM | POA: Insufficient documentation

## 2011-02-13 DIAGNOSIS — K921 Melena: Secondary | ICD-10-CM

## 2011-02-13 DIAGNOSIS — I10 Essential (primary) hypertension: Secondary | ICD-10-CM | POA: Insufficient documentation

## 2011-02-13 DIAGNOSIS — K449 Diaphragmatic hernia without obstruction or gangrene: Secondary | ICD-10-CM

## 2011-02-13 DIAGNOSIS — R197 Diarrhea, unspecified: Secondary | ICD-10-CM | POA: Insufficient documentation

## 2011-02-13 HISTORY — PX: ESOPHAGOGASTRODUODENOSCOPY: SHX5428

## 2011-02-13 LAB — HEMOGLOBIN AND HEMATOCRIT, BLOOD
HCT: 30 % — ABNORMAL LOW (ref 39.0–52.0)
Hemoglobin: 10 g/dL — ABNORMAL LOW (ref 13.0–17.0)

## 2011-02-13 SURGERY — EGD (ESOPHAGOGASTRODUODENOSCOPY)
Anesthesia: Moderate Sedation

## 2011-02-13 MED ORDER — STERILE WATER FOR IRRIGATION IR SOLN
Status: DC | PRN
  Administered 2011-02-13: 14:00:00

## 2011-02-13 MED ORDER — OMEPRAZOLE 20 MG PO CPDR: 20.0000 mg | DELAYED_RELEASE_CAPSULE | ORAL | Status: DC

## 2011-02-13 MED ORDER — SODIUM CHLORIDE 0.45 % IV SOLN
Freq: Once | INTRAVENOUS | Status: AC
  Administered 2011-02-13: 13:00:00 via INTRAVENOUS

## 2011-02-13 MED ORDER — BUTAMBEN-TETRACAINE-BENZOCAINE 2-2-14 % EX AERO
INHALATION_SPRAY | CUTANEOUS | Status: DC | PRN
  Administered 2011-02-13: 2 via TOPICAL

## 2011-02-13 MED ORDER — MIDAZOLAM HCL 5 MG/5ML IJ SOLN
INTRAMUSCULAR | Status: AC
  Filled 2011-02-13: qty 10

## 2011-02-13 MED ORDER — MEPERIDINE HCL 100 MG/ML IJ SOLN
INTRAMUSCULAR | Status: AC
  Filled 2011-02-13: qty 1

## 2011-02-13 MED ORDER — MIDAZOLAM HCL 5 MG/5ML IJ SOLN
INTRAMUSCULAR | Status: DC | PRN
  Administered 2011-02-13 (×2): 1 mg via INTRAVENOUS
  Administered 2011-02-13 (×2): 2 mg via INTRAVENOUS

## 2011-02-13 MED ORDER — MEPERIDINE HCL 100 MG/ML IJ SOLN
INTRAMUSCULAR | Status: DC | PRN
  Administered 2011-02-13 (×2): 50 mg via INTRAVENOUS

## 2011-02-13 NOTE — Op Note (Signed)
  [name], David Weiss                 ACCOUNT NO.:  [id number]  MEDICAL RECORD NO.:  [id number]  LOCATION:  [facility]                          FACILITY:  [facility]  PHYSICIAN:  R. Roetta Sessions, MD FACP FACGDATE OF BIRTH:  05-21-1946  DATE OF PROCEDURE:  01/06/2011 DATE OF DISCHARGE:                              OPERATIVE REPORT   PROCEDURE:  EGD with esophageal biopsy.  INDICATIONS FOR PROCEDURE:  A 64 year old gentleman with a history of Barrett's esophagus on prior EGD 1 year ago.  He was also found to have H. pylori gastritis and was treated.  He is doing well clinically on Prilosec 20 mg orally daily, specifically no upper GI tracts symptoms such as reflux, odynophagia, or dysphagia.  He has had any melena or any lower GI tract symptoms as well.  EGD is now being done for surveillance purposes.  Risks, benefits, alternatives, and limitations have been discussed, questions answered.  Please see the documentation in the medical record.  PROCEDURE NOTE:  O2 saturation, blood pressure, pulse, respirations were monitored throughout the entirety of the procedure.  CONSCIOUS SEDATION:  Versed 5 mg IV, Demerol 100 mg IV in divided doses.  INSTRUMENT:  Pentax video chip system.  FINDINGS:  Examination of tubular esophagus revealed salmon-colored epithelium coming up no more than 2 cm from the GE junction.  The junction otherwise appeared normal.  There was no esophageal mucosal abnormalities.  Otherwise, specifically no esophagitis.  No stricture. No evidence of neoplasm.  EG junction easily traversed with scope.  Stomach:  Gastric cavity was emptied and insufflated well with air. Thorough examination of gastric mucosa including retroflexion of proximal stomach, esophagogastric junction demonstrated only a small hiatal hernia.  Pylorus was patent, easily traversed.  Examination of bulb and second portion revealed no abnormalities.  THERAPEUTIC/DIAGNOSTIC MANEUVERS PERFORMED:  Subsequent  biopsies of the salmon-colored epithelium and GE junction were taken.  The patient tolerated the procedure well and was reactive to endoscopy.  IMPRESSION:  Salmon-colored epithelium coming up no more than 2 cm from the GE junction consistent with no more than short segment Barrett's esophagus as noted before, status post biopsy small hiatal hernia, otherwise normal stomach, D1 and D2.  RECOMMENDATIONS: 1. Continue Prilosec 20 mg orally daily. 2. Follow up on path. 3. Further recommendations to follow.     [name], MD FACP [facility]     RMR/MEDQ  D:  01/06/2011  T:  01/06/2011  Job:  [id number]  cc:   Kingsley Callander. Ouida Sills, MD Fax: [phone]  Electronically Signed by Lorrin Goodell M.D. on 02/13/2011 08:39:09 AM

## 2011-02-13 NOTE — Telephone Encounter (Signed)
Dr. Jena Gauss called and asked that I follow up on patient. Apparently Dr. Ouida Sills called Dr. Darrick Penna about this patient on Friday evening. C/O melena. Had EGD 01/06/2011, Barrett's.   Spoke with patient. Friday, melena. Had several black BMs throughout weekend. Denies Pepto, etc.  "fine" done Friday afternoon. No HGB since. This morning, brown stool. Omeprazole increased to BID on Friday. Patient currently NPO per instructions provided.    Discussed with Dr. Jena Gauss. EGD today with STAT H/H upon arrival to endo. Please arrange.

## 2011-02-13 NOTE — Telephone Encounter (Signed)
I spoke with Pam and gave her the order for a STAT H/H.  She told me to have the pt to come in at 11:30am for his EGD. I spoke with the pt and gave him instructions to check in at [facility] at 11:30am.

## 2011-02-13 NOTE — H&P (Signed)
Primary Care Physician:  Carylon Perches, MD Primary Gastroenterologist:  Dr.   Pre-Procedure History & Physical: HPI:  David Weiss is a 65 y.o. male here for diagnostic EGD. Patient reports doing well last week he had a couple episodes of self-limiting nonbloody diarrhea. On 02/10/2011 he noted the purplish bowel movements followed by black tarry stools. He saw Dr. Ouida Sills CBC from last week demonstrated a normal white count and H&H of 13.6 and 40.8 MCV was 89.1; he was instructed to increase his PPI therapy to twice a day.  He reports continued black tarry stools through the weekend today however he notes a brown relatively normal bowel movements. Of note, repeat labs today reveals a hemoglobin and hematocrit of 10.0 and 30.0, respectively. The patient denies hematemesis nausea vomiting or reflux symptoms or any abdominal pain whatsoever. He denies any nonsteroidal agents whatsoever as well.  He just underwent a surveillance EGD some 5 weeks ago for followup of Barrett's esophagus. He was found to have Barrett's biopsies revealed no evidence of dysplasia. He was slated to have a repeat EGD in 3 years.  Past Medical History  Diagnosis Date  . Barrett's esophagus 5/11    EGD Dr. Edgardo Roys Barrett's, multiple antral/bulbar erosins, D2 erosion sec NSAIDs  . GERD (gastroesophageal reflux disease)   . HTN (hypertension)   . Hyperlipemia   . Helicobacter pylori gastritis 5/11  . Diverticulosis 5/11    Colonoscopy Dr Jena Gauss  . Hx of adenomatous colonic polyps 8/10    Colonoscopy Dr. Jena Gauss  . Family hx of colon cancer   . Schatzki's ring 5/11    18F dilator    No past surgical history on file.  Prior to Admission medications   Medication Sig Start Date End Date Taking? Authorizing Provider  amLODipine (NORVASC) 5 MG tablet Take 5 mg by mouth daily.     Yes Historical Provider, MD  benazepril (LOTENSIN) 10 MG tablet Take 10 mg by mouth daily.     Yes Historical Provider, MD  Cholecalciferol  (VITAMIN D3) 400 UNITS CAPS Take 3 capsules by mouth daily.     Yes Historical Provider, MD  fish oil-omega-3 fatty acids 1000 MG capsule Take 1 g by mouth daily.     Yes Historical Provider, MD  LIPITOR 20 MG tablet 20 mg. 1/2 pill a day 12/12/10  Yes Historical Provider, MD  omeprazole (PRILOSEC) 20 MG capsule Take 20 mg by mouth 2 (two) times daily.  11/30/10  Yes Historical Provider, MD    Allergies as of 02/13/2011 - Review Complete 02/13/2011  Allergen Reaction Noted  . Pantoprazole sodium  02/14/2010    Family History  Problem Relation Age of Onset  . Prostate cancer Father 34  . Diabetes Mother   . Heart failure Mother   . Colon cancer Brother 108    History   Social History  . Marital Status: Married    Spouse Name: N/A    Number of Children: 2  . Years of Education: N/A   Occupational History  . retired     Airline pilot   Social History Main Topics  . Smoking status: Former Smoker -- 1.5 packs/day for 30 years    Types: Cigarettes    Quit date: 03/18/1981  . Smokeless tobacco: Not on file  . Alcohol Use: Yes     two drinks a day bourbon  . Drug Use: No  . Sexually Active: Not on file   Other Topics Concern  . Not on file   Social  History Narrative   Lives w/ wife    Review of Systems: See HPI, otherwise negative ROS  Physical Exam: BP 129/75  Pulse 64  Temp(Src) 98.2 F (36.8 C) (Oral)  Resp 18  Ht 5\' 8"  (1.727 m)  Wt 84.823 kg (187 lb)  BMI 28.43 kg/m2  SpO2 95% General:   Alert,  Well-developed, well-nourished, pleasant and cooperative in NAD Head:  Normocephalic and atraumatic. Eyes:  Sclera clear, no icterus.   Conjunctiva pink. Ears:  Normal auditory acuity. Nose:  No deformity, discharge,  or lesions. Mouth:  No deformity or lesions, dentition normal. Neck:  Supple; no masses or thyromegaly. Lungs:  Clear throughout to auscultation.   No wheezes, crackles, or rhonchi. No acute distress. Heart:  Regular rate and rhythm; no murmurs, clicks,  rubs,  or gallops. Abdomen:  Soft, nontender and nondistended. No masses, hepatosplenomegaly or hernias noted. Normal bowel sounds, without guarding, and without rebound.   Msk:  Symmetrical without gross deformities. Normal posture. Pulses:  Normal pulses noted. Extremities:  Without clubbing or edema. Neurologic:  Alert and  oriented x4;  grossly normal neurologically. Skin:  Intact without significant lesions or rashes. Cervical Nodes:  No significant cervical adenopathy. Psych:  Alert and cooperative. Normal mood and affect.  Impression/Plan:  Melena and a 65 year old gentleman Barrett's esophagus. He remains hemodynamically stable. He has had a notable drop in his hemoglobin over the past 3 days. Clinical presentation is suggestive of an upper GI tract source for his bleeding although small bowel etiology remains a possibility. He just had an EGD not too long ago.  He did have nonbloody diarrhea last week predating the onset of melena. Without any abdominal pain whatsoever, the presentation would be most unusual for any type of ischemic phenomenon at this time. I doubt this is a delayed bleed from small pinch biopsies of his esophagus. He certainly could have a Dieulafoy or AVM or other occult culprit (small bowel lesion) to account for his presentation.  He is now undergoing a diagnostic EGD.  Further recommendations to follow.  Risks, benefits, limitations, alternatives and imponderables have been reviewed with the patient. Potential for esophageal dilation, biopsy etc. Have also been reviewed.  Questions have been answered. All parties agreeable.

## 2011-02-15 ENCOUNTER — Encounter (HOSPITAL_COMMUNITY)
Admission: RE | Disposition: A | Discharge status: Home / Self Care | Payer: Self-pay | Source: Ambulatory Visit | Attending: Internal Medicine

## 2011-02-15 ENCOUNTER — Encounter (HOSPITAL_COMMUNITY): Payer: Self-pay | Admitting: *Deleted

## 2011-02-15 ENCOUNTER — Ambulatory Visit (HOSPITAL_COMMUNITY)
Admission: RE | Admit: 2011-02-15 | Discharge: 2011-02-15 | Disposition: A | Discharge status: Home / Self Care | Payer: Medicare Other | Source: Ambulatory Visit | Attending: Internal Medicine | Admitting: Internal Medicine

## 2011-02-15 DIAGNOSIS — K921 Melena: Secondary | ICD-10-CM | POA: Insufficient documentation

## 2011-02-15 DIAGNOSIS — K5520 Angiodysplasia of colon without hemorrhage: Secondary | ICD-10-CM

## 2011-02-15 HISTORY — PX: GIVENS CAPSULE STUDY: SHX5432

## 2011-02-15 SURGERY — IMAGING PROCEDURE, GI TRACT, INTRALUMINAL, VIA CAPSULE
Anesthesia: LOCAL

## 2011-02-16 ENCOUNTER — Encounter: Payer: Medicare Other | Admitting: Gastroenterology

## 2011-02-16 ENCOUNTER — Encounter: Payer: Medicare Other | Admitting: Urgent Care

## 2011-02-16 ENCOUNTER — Ambulatory Visit: Payer: Medicare Other | Admitting: Urgent Care

## 2011-02-17 ENCOUNTER — Telehealth: Payer: Self-pay | Admitting: Urgent Care

## 2011-02-17 ENCOUNTER — Encounter (HOSPITAL_COMMUNITY): Payer: Self-pay | Admitting: Urgent Care

## 2011-02-17 HISTORY — PX: CAPSULE ENDOSCOPY OF THE SMALL BOWEL: NUR15399

## 2011-02-17 NOTE — Telephone Encounter (Signed)
Called patient and told him results of test, but he would like for Dr.Rourk or someone to call him to go over this more because he want to know what a AVM is and what could have cause the bleeding.

## 2011-02-17 NOTE — Telephone Encounter (Signed)
Had taken indocin for gout Advised pt to avoid NSAIDs & indocin Questioned daily bourbon use Advise to abstain for now Questions answered.  Pt satisfied.

## 2011-02-17 NOTE — Procedures (Signed)
Small Bowel Givens Capsule Study Procedure date:  02/15/11  Referring Provider:  Dr. Jena Gauss PCP:  Carylon Perches, MD  Indication for procedure:  Melena, 3 gram drop in Hgb.  Hx Barrett's esophagus, otherwise normal EGD 02/13/11.  Patient data:  Wt: 188#  Ht: 69in Waist: 42in  Findings:   Single non-bleeding AVM in proximal small bowel at 01:09:12 Otherwise normal small bowel study No evidence or mass, stricture, ulcers or active bleeding.  First Gastric image:  00:00:12  First Duodenal image: 00:44:19  First Ileo-Cecal Valve image: 03:41:49 First Cecal image: 03:41:51 Gastric Passage time: 0h 22m Small Bowel Passage time:  2h 9m  Summary & Recommendations: Recheck Hgb in 4 wks If further drop in hgb or melena, consider will need to be reevaluated by Dr Jena Gauss & consider repeat EGD to look for Dieulafoy &/or repeat colonoscopy vs. bleeding scan. Avoid all NSAIDs.  Lorenza Burton 10:50 AM   CC: Carylon Perches, MD

## 2011-02-17 NOTE — Telephone Encounter (Signed)
Please call pt His small bowel study was normal except 1 tiny AVM (kind of like a birthmark with blood vessels that can bleed but it WAS NOT BLEEDING) Avoid all NSAIDS Call or to ER if any further bleeding  Recheck CBC in 1 month or sooner if dark stools/bleeding Thanks

## 2011-02-22 ENCOUNTER — Other Ambulatory Visit: Payer: Self-pay

## 2011-02-22 DIAGNOSIS — D649 Anemia, unspecified: Secondary | ICD-10-CM

## 2011-02-22 NOTE — Telephone Encounter (Signed)
I mailed out the order for David Weiss to get blood work done on August 20,2012 I will also send a letter to explain to him what this id for.

## 2011-02-24 ENCOUNTER — Encounter (HOSPITAL_COMMUNITY): Payer: Self-pay | Admitting: Internal Medicine

## 2011-02-27 ENCOUNTER — Telehealth: Payer: Self-pay

## 2011-02-27 NOTE — Telephone Encounter (Signed)
Pt called- he has some questions about his procedure (givens)  He had done and is requesting RMR call him when he gets back from vacation. Offered to try to answer and questions he had and pt stated he just wanted to speak with RMR when he gets back. pts number is [phone]

## 2011-03-01 ENCOUNTER — Encounter (HOSPITAL_COMMUNITY): Payer: Self-pay | Admitting: Internal Medicine

## 2011-03-07 ENCOUNTER — Encounter: Payer: Self-pay | Admitting: Internal Medicine

## 2011-03-07 NOTE — Telephone Encounter (Signed)
I called patient today and reviewed his workup to date regarding his recent GI bleed. An AVM or less likely a dieulafoy cause of bleeding precipitated by nonsteroidal drug use. Emphasized avoiding nonsteroidal drugs. He is to have a hemoglobin repeated in the near future. As long as hemoglobin rebounds to normal rapidly, I see no need to change the timetable of his next colonoscopy which would be 3 years from now. If hemoglobin is not read down would consider moving up the surveillance examination.

## 2011-03-20 ENCOUNTER — Other Ambulatory Visit: Payer: Self-pay | Admitting: Urgent Care

## 2011-03-21 ENCOUNTER — Other Ambulatory Visit: Payer: Self-pay | Admitting: Urgent Care

## 2011-03-21 DIAGNOSIS — K921 Melena: Secondary | ICD-10-CM

## 2011-03-21 DIAGNOSIS — D518 Other vitamin B12 deficiency anemias: Secondary | ICD-10-CM

## 2011-03-21 DIAGNOSIS — K2970 Gastritis, unspecified, without bleeding: Secondary | ICD-10-CM

## 2011-03-21 DIAGNOSIS — D649 Anemia, unspecified: Secondary | ICD-10-CM

## 2011-03-21 LAB — CBC WITH DIFFERENTIAL/PLATELET
HCT: 34.3 % — ABNORMAL LOW (ref 39.0–52.0)
Hemoglobin: 10 g/dL — ABNORMAL LOW (ref 13.0–17.0)
Lymphocytes Relative: 27 % (ref 12–46)
Lymphs Abs: 1.4 10*3/uL (ref 0.7–4.0)
MCHC: 29.2 g/dL — ABNORMAL LOW (ref 30.0–36.0)
Monocytes Absolute: 0.6 10*3/uL (ref 0.1–1.0)
Monocytes Relative: 11 % (ref 3–12)
Neutro Abs: 3.1 10*3/uL (ref 1.7–7.7)
Neutrophils Relative %: 59 % (ref 43–77)
RBC: 4.21 MIL/uL — ABNORMAL LOW (ref 4.22–5.81)
WBC: 5.2 10*3/uL (ref 4.0–10.5)

## 2011-03-22 ENCOUNTER — Telehealth: Payer: Self-pay | Admitting: Urgent Care

## 2011-03-22 LAB — VITAMIN B12: Vitamin B-12: 195 pg/mL — ABNORMAL LOW (ref 211–911)

## 2011-03-22 LAB — HELICOBACTER PYLORI  SPECIAL ANTIGEN: H. PYLORI Antigen: NEGATIVE

## 2011-03-22 LAB — FERRITIN: Ferritin: 6 ng/mL — ABNORMAL LOW (ref 22–322)

## 2011-03-22 LAB — IRON AND TIBC

## 2011-03-22 MED ORDER — FERROUS SULFATE 325 (65 FE) MG PO TABS: 325.0000 mg | ORAL_TABLET | Freq: Every day | ORAL | Status: DC

## 2011-03-22 NOTE — Telephone Encounter (Signed)
Please call patient. His iron and ferritin are low and I will like him to start an iron pill daily. I have sent the prescription to [facility]. His B12 is also slightly low and he will need to followup with his primary care provider for this. He needs an office visit with RMR only to discuss the next step for his IDA. Be sure he is not taking any NSAIDs and not drinking more than one alcoholic beverage per day. CcCarylon Perches, MD

## 2011-03-22 NOTE — Telephone Encounter (Signed)
Tried to call pt- LM with wife

## 2011-03-22 NOTE — Telephone Encounter (Signed)
Pt aware, please send this and copy of labs to pcp

## 2011-03-23 ENCOUNTER — Telehealth: Payer: Self-pay

## 2011-03-23 ENCOUNTER — Telehealth: Payer: Self-pay | Admitting: Internal Medicine

## 2011-03-23 NOTE — Telephone Encounter (Signed)
Dr. Jena Gauss Mr. David Weiss is addiment about seeing you only and I need to schedule him in 6 wks for F/U. Can you please advise as to when I can get him in to see you at that time. You are booked up until Nov on the schedule. Thanks!!

## 2011-03-23 NOTE — Telephone Encounter (Signed)
Pt called- he is requesting a call from KJ at [phone]. He wants to talk to you about his blood work.

## 2011-03-23 NOTE — Telephone Encounter (Signed)
15 minute conversation w/ pt regarding GI bleed, poss intermittently bleeding AVM/Dieulafoy Discussed negative h pylori stool Discussed recommendations including starting iron, FU w/ PCP re:B12, Avoiding all NSAIDS, avoiding alcohol Needs OV w/ RMR ONLY in next 6 weeks Thanks

## 2011-03-23 NOTE — Telephone Encounter (Signed)
Labs Cc to PCP

## 2011-03-24 ENCOUNTER — Telehealth: Payer: Self-pay | Admitting: Internal Medicine

## 2011-03-24 DIAGNOSIS — D649 Anemia, unspecified: Secondary | ICD-10-CM

## 2011-03-27 NOTE — Telephone Encounter (Signed)
I moved pt to 04/19/2011@3 :00p.m

## 2011-03-27 NOTE — Telephone Encounter (Signed)
Please schedule pt to see RMR 04/19/2011@3 :00pm.

## 2011-03-27 NOTE — Telephone Encounter (Signed)
Pt aware of appt and blood work.

## 2011-03-27 NOTE — Telephone Encounter (Signed)
Discuss scheduling issue with [facility]. I'd like to see David Weiss sooner than October 30 as well. Let's find a slot for him on the schedule in mid September with a recent H&H (within 3 weeks) of office visit would suffice. Please let him know.

## 2011-03-27 NOTE — Telephone Encounter (Signed)
Blood work faxed to the lab.

## 2011-03-28 LAB — HEMOGLOBIN: Hemoglobin: 10 g/dL — AB (ref 13.5–17.5)

## 2011-03-30 ENCOUNTER — Telehealth: Payer: Self-pay

## 2011-03-30 NOTE — Telephone Encounter (Signed)
Pt called requesting H&H results. No results were available in [facility]. Called [facility] customer service and they did not have any results on him. Pt stated he had it done on Monday 03/27/11. Called [facility] lab in [place] and they looked it up and faxed results to [place]. I asked [facility] to look at them and  His hgb was 10 and his HCT was 34. Per [facility] ok to let pt know that labs were stable. Pt informed.

## 2011-04-10 ENCOUNTER — Other Ambulatory Visit: Payer: Self-pay | Admitting: Internal Medicine

## 2011-04-10 DIAGNOSIS — D649 Anemia, unspecified: Secondary | ICD-10-CM

## 2011-04-17 ENCOUNTER — Other Ambulatory Visit: Payer: Self-pay | Admitting: Gastroenterology

## 2011-04-17 ENCOUNTER — Other Ambulatory Visit: Payer: Self-pay | Admitting: Internal Medicine

## 2011-04-17 DIAGNOSIS — E538 Deficiency of other specified B group vitamins: Secondary | ICD-10-CM

## 2011-04-17 NOTE — Progress Notes (Signed)
Patient walked in to clinic requesting [place] to recheck his B12 level with his next labs.

## 2011-04-17 NOTE — Progress Notes (Signed)
Gave pt lab order from [facility].

## 2011-04-18 LAB — CBC WITH DIFFERENTIAL/PLATELET
Basophils Relative: 1 % (ref 0–1)
Eosinophils Absolute: 0.2 10*3/uL (ref 0.0–0.7)
Eosinophils Relative: 3 % (ref 0–5)
Lymphs Abs: 1.6 10*3/uL (ref 0.7–4.0)
MCH: 25 pg — ABNORMAL LOW (ref 26.0–34.0)
MCHC: 29.4 g/dL — ABNORMAL LOW (ref 30.0–36.0)
MCV: 85 fL (ref 78.0–100.0)
Monocytes Relative: 9 % (ref 3–12)
Neutrophils Relative %: 55 % (ref 43–77)
Platelets: 251 10*3/uL (ref 150–400)
RBC: 5.05 MIL/uL (ref 4.22–5.81)

## 2011-04-18 LAB — VITAMIN B12: Vitamin B-12: 495 pg/mL (ref 211–911)

## 2011-04-19 ENCOUNTER — Encounter: Payer: Self-pay | Admitting: Internal Medicine

## 2011-04-19 ENCOUNTER — Ambulatory Visit: Payer: Medicare Other | Admitting: Internal Medicine

## 2011-04-19 ENCOUNTER — Ambulatory Visit (INDEPENDENT_AMBULATORY_CARE_PROVIDER_SITE_OTHER): Payer: Medicare Other | Admitting: Internal Medicine

## 2011-04-19 DIAGNOSIS — D509 Iron deficiency anemia, unspecified: Secondary | ICD-10-CM | POA: Insufficient documentation

## 2011-04-19 DIAGNOSIS — K922 Gastrointestinal hemorrhage, unspecified: Secondary | ICD-10-CM

## 2011-04-19 NOTE — Progress Notes (Signed)
Primary Care Physician:  Carylon Perches, MD Primary Gastroenterologist:  Dr. Jena Gauss  Pre-Procedure History & Physical: HPI:  David Weiss is a 65 y.o. male here for followup of her recent GI bleed. Bleeding felt to be a small bowel insult related to NSAID use. He had a similar presentation last year. On our laboratory evaluation, he was found have a striking Iron deficiency anemia. Of note, his B12 level was also low. He has been on Integra and vitamin B12 supplementation. Aside from chronically dark stools and some fatigue E. staining no GI symptoms at this time. His H&H has rebounded to 12 and 42 with supplementation.  He has a personal history of colonic adenomas and a positive family history colon cancer in a brother. His last colonoscopy was just over 2 years ago.  Past Medical History  Diagnosis Date  . Barrett's esophagus 5/11    EGD Dr. Edgardo Roys Barrett's, multiple antral/bulbar erosins, D2 erosion sec NSAIDs  . GERD (gastroesophageal reflux disease)   . HTN (hypertension)   . Hyperlipemia   . Helicobacter pylori gastritis 5/11  . Diverticulosis 5/11    Colonoscopy Dr Jena Gauss  . Hx of adenomatous colonic polyps 8/10    Colonoscopy Dr. Jena Gauss  . Family hx of colon cancer   . Schatzki's ring 5/11    85F dilator    Past Surgical History  Procedure Date  . Capsule endoscopy of the small bowel 02/17/2011       . Esophagogastroduodenoscopy 02/13/2011    Procedure: ESOPHAGOGASTRODUODENOSCOPY (EGD);  Surgeon: Corbin Ade, MD;  [facility];  Service: Endoscopy;  Laterality: N/A;  . Givens capsule study 02/15/2011    Procedure: GIVENS CAPSULE STUDY;  Surgeon: Corbin Ade, MD;  Location: [facility];  Service: Endoscopy;  Laterality: N/A;    Prior to Admission medications   Medication Sig Start Date End Date Taking? Authorizing Provider  amLODipine (NORVASC) 5 MG tablet Take 5 mg by mouth daily.     Yes Historical Provider, MD  benazepril (LOTENSIN) 10 MG tablet Take  10 mg by mouth daily.     Yes Historical Provider, MD  cyanocobalamin 2000 MCG tablet Take 2,000 mcg by mouth daily.     Yes Historical Provider, MD  FeFum-FePoly-FA-B Cmp-C-Biot (INTEGRA PLUS PO) Take by mouth.     Yes Historical Provider, MD  LIPITOR 20 MG tablet 20 mg. 1/2 pill a day 12/12/10  Yes Historical Provider, MD  omeprazole (PRILOSEC) 20 MG capsule Take 1 capsule (20 mg total) by mouth 1 day or 1 dose. 02/13/11  Yes Corbin Ade, MD  Cholecalciferol (VITAMIN D3) 400 UNITS CAPS Take 3 capsules by mouth daily.      Historical Provider, MD  ferrous sulfate 325 (65 FE) MG tablet Take 1 tablet (325 mg total) by mouth daily with breakfast. 03/22/11 03/21/12  Lorenza Burton, NP  fish oil-omega-3 fatty acids 1000 MG capsule Take 1 g by mouth daily.      Historical Provider, MD    Allergies as of 04/19/2011 - Review Complete 04/19/2011  Allergen Reaction Noted  . Pantoprazole sodium  02/14/2010    Family History  Problem Relation Age of Onset  . Prostate cancer Father 61  . Diabetes Mother   . Heart failure Mother   . Colon cancer Brother 40    History   Social History  . Marital Status: Married    Spouse Name: N/A    Number of Children: 2  . Years of Education: N/A  Occupational History  . retired     Airline pilot   Social History Main Topics  . Smoking status: Former Smoker -- 1.5 packs/day for 30 years    Types: Cigarettes    Quit date: 03/18/1981  . Smokeless tobacco: Not on file  . Alcohol Use: Yes     two drinks a day bourbon  . Drug Use: No  . Sexually Active: Not on file   Other Topics Concern  . Not on file   Social History Narrative   Lives w/ wife    Review of Systems: See HPI, otherwise negative ROS  Physical Exam: BP 146/65  Pulse 79  Temp(Src) 97.7 F (36.5 C) (Temporal)  Ht 5\' 8"  (1.727 m)  Wt 192 lb 12.8 oz (87.454 kg)  BMI 29.32 kg/m2 General:   Alert,  Well-developed, well-nourished, pleasant and cooperative in NAD Head:  Normocephalic and  atraumatic. Eyes:  Sclera clear, no icterus.   Conjunctiva pink. Ears:  Normal auditory acuity. Nose:  No deformity, discharge,  or lesions. Mouth:  No deformity or lesions, dentition normal. Neck:  Supple; no masses or thyromegaly. Lungs:  Clear throughout to auscultation.   No wheezes, crackles, or rhonchi. No acute distress. Heart:  Regular rate and rhythm; no murmurs, clicks, rubs,  or gallops. Abdomen:  Soft, nontender and nondistended. No masses, hepatosplenomegaly or hernias noted. Normal bowel sounds, without guarding, and without rebound.   Msk:  Symmetrical without gross deformities. Normal posture. Pulses:  Normal pulses noted. Extremities:  Without clubbing or edema. Neurologic:  Alert and  oriented x4;  grossly normal neurologically. Skin:  Intact without significant lesions or rashes. Cervical Nodes:  No significant cervical adenopathy. Psych:  Alert and cooperative. Normal mood and affect.

## 2011-04-19 NOTE — Patient Instructions (Signed)
Continue iron supplementation in the way of Integra  Continue your vitamin B12 supplement.  We will schedule a colonoscopy in the near future.

## 2011-04-19 NOTE — Assessment & Plan Note (Signed)
Impression/Plan:   Recent episode of overt GI bleeding characterized by 3 g drop in hemoglobin and melena. Essentially negative EGD. Capsule study the small bowel demonstrated a classic AVM. Similar episode last year. In both instances, concomitant NSAID use was felt to be the culprit.  He almost has a normal H&H at this time however his iron deficiency state is striking.  An acute, self limiting episode of GI bleeding would be less likely to cause this degree of iron deficiency. It is also interesting that he has vitamin B12 deficiency. Endoscopically small bowel mucosa looked normal but it was not biopsied.  It is also somewhat bothersome to me he has a positive family history of colon cancer in a first-degree relative at a relatively young age and has a personal history of colonic adenomas.  I have recommended we go ahead and proceed with a diagnostic colonoscopy at this time. Risks, benefits, limitations, alternatives and imponderables have been reviewed with the patient. Questions have been answered. All parties are agreeable.  Colonoscopy will be set up at the hospital in the near future.

## 2011-04-19 NOTE — Progress Notes (Signed)
normal

## 2011-05-18 MED ORDER — SODIUM CHLORIDE 0.45 % IV SOLN
Freq: Once | INTRAVENOUS | Status: AC
  Administered 2011-05-19: 08:00:00 via INTRAVENOUS

## 2011-05-19 ENCOUNTER — Encounter (HOSPITAL_COMMUNITY)
Admission: RE | Disposition: A | Discharge status: Home / Self Care | Payer: Self-pay | Source: Ambulatory Visit | Attending: Internal Medicine

## 2011-05-19 ENCOUNTER — Other Ambulatory Visit: Payer: Self-pay | Admitting: Internal Medicine

## 2011-05-19 ENCOUNTER — Ambulatory Visit (HOSPITAL_COMMUNITY)
Admission: RE | Admit: 2011-05-19 | Discharge: 2011-05-19 | Disposition: A | Discharge status: Home / Self Care | Payer: Medicare Other | Source: Ambulatory Visit | Attending: Internal Medicine | Admitting: Internal Medicine

## 2011-05-19 ENCOUNTER — Encounter (HOSPITAL_COMMUNITY): Payer: Self-pay | Admitting: *Deleted

## 2011-05-19 DIAGNOSIS — D509 Iron deficiency anemia, unspecified: Secondary | ICD-10-CM | POA: Insufficient documentation

## 2011-05-19 DIAGNOSIS — K5730 Diverticulosis of large intestine without perforation or abscess without bleeding: Secondary | ICD-10-CM | POA: Insufficient documentation

## 2011-05-19 DIAGNOSIS — D126 Benign neoplasm of colon, unspecified: Secondary | ICD-10-CM | POA: Insufficient documentation

## 2011-05-19 DIAGNOSIS — Z860100 Personal history of colon polyps, unspecified: Secondary | ICD-10-CM | POA: Insufficient documentation

## 2011-05-19 DIAGNOSIS — Z01812 Encounter for preprocedural laboratory examination: Secondary | ICD-10-CM | POA: Insufficient documentation

## 2011-05-19 DIAGNOSIS — I10 Essential (primary) hypertension: Secondary | ICD-10-CM | POA: Insufficient documentation

## 2011-05-19 DIAGNOSIS — E785 Hyperlipidemia, unspecified: Secondary | ICD-10-CM | POA: Insufficient documentation

## 2011-05-19 DIAGNOSIS — K922 Gastrointestinal hemorrhage, unspecified: Secondary | ICD-10-CM

## 2011-05-19 DIAGNOSIS — Z79899 Other long term (current) drug therapy: Secondary | ICD-10-CM | POA: Insufficient documentation

## 2011-05-19 HISTORY — DX: Anemia, unspecified: D64.9

## 2011-05-19 HISTORY — PX: COLONOSCOPY: SHX5424

## 2011-05-19 LAB — HEMOGLOBIN AND HEMATOCRIT, BLOOD
HCT: 28.4 % — ABNORMAL LOW (ref 39.0–52.0)
Hemoglobin: 8.8 g/dL — ABNORMAL LOW (ref 13.0–17.0)

## 2011-05-19 SURGERY — COLONOSCOPY
Anesthesia: Moderate Sedation

## 2011-05-19 MED ORDER — MEPERIDINE HCL 100 MG/ML IJ SOLN
INTRAMUSCULAR | Status: AC
  Filled 2011-05-19: qty 1

## 2011-05-19 MED ORDER — SODIUM CHLORIDE 0.45 % IV SOLN
Freq: Once | INTRAVENOUS | Status: AC
  Administered 2011-05-19: 10:00:00 via INTRAVENOUS

## 2011-05-19 MED ORDER — STERILE WATER FOR IRRIGATION IR SOLN
Status: DC | PRN
  Administered 2011-05-19: 09:00:00

## 2011-05-19 MED ORDER — MIDAZOLAM HCL 5 MG/5ML IJ SOLN
INTRAMUSCULAR | Status: AC
  Filled 2011-05-19: qty 10

## 2011-05-19 MED ORDER — MIDAZOLAM HCL 5 MG/5ML IJ SOLN
INTRAMUSCULAR | Status: DC | PRN
  Administered 2011-05-19 (×2): 1 mg via INTRAVENOUS
  Administered 2011-05-19: 2 mg via INTRAVENOUS

## 2011-05-19 MED ORDER — MEPERIDINE HCL 100 MG/ML IJ SOLN
INTRAMUSCULAR | Status: DC | PRN
  Administered 2011-05-19: 50 mg
  Administered 2011-05-19: 25 mg

## 2011-05-19 NOTE — H&P (Signed)
David Listen, MD 04/19/2011 2:27 PM Signed  Primary Care Physician: Carylon Perches, MD  Primary Gastroenterologist: Dr. Jena Gauss  Pre-Procedure History & Physical:  HPI: David Weiss is a 65 y.o. male here for followup of her recent GI bleed. Bleeding felt to be a small bowel insult related to NSAID use. He had a similar presentation last year. On our laboratory evaluation, he was found have a striking Iron deficiency anemia. Of note, his B12 level was also low. He has been on Integra and vitamin B12 supplementation. Aside from chronically dark stools and some fatigue E. staining no GI symptoms at this time. His H&H has rebounded to 12 and 42 with supplementation. He has a personal history of colonic adenomas and a positive family history colon cancer in a brother. His last colonoscopy was just over 2 years ago.  Past Medical History   Diagnosis  Date   .  Barrett's esophagus  5/11     EGD Dr. Edgardo Roys Barrett's, multiple antral/bulbar erosins, D2 erosion sec NSAIDs   .  GERD (gastroesophageal reflux disease)    .  HTN (hypertension)    .  Hyperlipemia    .  Helicobacter pylori gastritis  5/11   .  Diverticulosis  5/11     Colonoscopy Dr Jena Gauss   .  Hx of adenomatous colonic polyps  8/10     Colonoscopy Dr. Jena Gauss   .  Family hx of colon cancer    .  Schatzki's ring  5/11     26F dilator    Past Surgical History   Procedure  Date   .  Capsule endoscopy of the small bowel  02/17/2011       .  Esophagogastroduodenoscopy  02/13/2011     Procedure: ESOPHAGOGASTRODUODENOSCOPY (EGD); Surgeon: Corbin Ade, MD; Location: [facility]; Service: Endoscopy; Laterality: N/A;   .  Givens capsule study  02/15/2011     Procedure: GIVENS CAPSULE STUDY; Surgeon: Corbin Ade, MD; Location: [facility]; Service: Endoscopy; Laterality: N/A;    Prior to Admission medications   Medication  Sig  Start Date  End Date  Taking?  Authorizing Provider   amLODipine (NORVASC) 5 MG tablet  Take 5 mg by mouth  daily.    Yes  Historical Provider, MD   benazepril (LOTENSIN) 10 MG tablet  Take 10 mg by mouth daily.    Yes  Historical Provider, MD   cyanocobalamin 2000 MCG tablet  Take 2,000 mcg by mouth daily.    Yes  Historical Provider, MD   FeFum-FePoly-FA-B Cmp-C-Biot (INTEGRA PLUS PO)  Take by mouth.    Yes  Historical Provider, MD   LIPITOR 20 MG tablet  20 mg. 1/2 pill a day  12/12/10   Yes  Historical Provider, MD   omeprazole (PRILOSEC) 20 MG capsule  Take 1 capsule (20 mg total) by mouth 1 day or 1 dose.  02/13/11   Yes  Corbin Ade, MD   Cholecalciferol (VITAMIN D3) 400 UNITS CAPS  Take 3 capsules by mouth daily.     Historical Provider, MD   ferrous sulfate 325 (65 FE) MG tablet  Take 1 tablet (325 mg total) by mouth daily with breakfast.  03/22/11  03/21/12   Lorenza Burton, NP   fish oil-omega-3 fatty acids 1000 MG capsule  Take 1 g by mouth daily.     Historical Provider, MD    Allergies as of 04/19/2011 - Review Complete 04/19/2011   Allergen  Reaction  Noted   .  Pantoprazole sodium   02/14/2010    Family History   Problem  Relation  Age of Onset   .  Prostate cancer  Father  66   .  Diabetes  Mother    .  Heart failure  Mother    .  Colon cancer  Brother  37    History    Social History   .  Marital Status:  Married     Spouse Name:  N/A     Number of Children:  2   .  Years of Education:  N/A    Occupational History   .  retired      Airline pilot    Social History Main Topics   .  Smoking status:  Former Smoker -- 1.5 packs/day for 30 years     Types:  Cigarettes     Quit date:  03/18/1981   .  Smokeless tobacco:  Not on file   .  Alcohol Use:  Yes      two drinks a day bourbon   .  Drug Use:  No   .  Sexually Active:  Not on file    Other Topics  Concern   .  Not on file    Social History Narrative    Lives w/ wife    Review of Systems:  See HPI, otherwise negative ROS  Physical Exam:  BP 146/65  Pulse 79  Temp(Src) 97.7 F (36.5 C) (Temporal)  Ht 5\' 8"   (1.727 m)  Wt 192 lb 12.8 oz (87.454 kg)  BMI 29.32 kg/m2  General: Alert, Well-developed, well-nourished, pleasant and cooperative in NAD  Head: Normocephalic and atraumatic.  Eyes: Sclera clear, no icterus. Conjunctiva pink.  Ears: Normal auditory acuity.  Nose: No deformity, discharge, or lesions.  Mouth: No deformity or lesions, dentition normal.  Neck: Supple; no masses or thyromegaly.  Lungs: Clear throughout to auscultation. No wheezes, crackles, or rhonchi. No acute distress.  Heart: Regular rate and rhythm; no murmurs, clicks, rubs, or gallops.  Abdomen: Soft, nontender and nondistended. No masses, hepatosplenomegaly or hernias noted. Normal bowel sounds, without guarding, and without rebound.  Msk: Symmetrical without gross deformities. Normal posture.  Pulses: Normal pulses noted.  Extremities: Without clubbing or edema.  Neurologic: Alert and oriented x4; grossly normal neurologically.  Skin: Intact without significant lesions or rashes.  Cervical Nodes: No significant cervical adenopathy.  Psych: Alert and cooperative. Normal mood and affect.   Iron deficiency anemia - David Listen, MD 04/19/2011 2:23 PM Signed  Impression/Plan:  Recent episode of overt GI bleeding characterized by 3 g drop in hemoglobin and melena. Essentially negative EGD. Capsule study the small bowel demonstrated a classic AVM. Similar episode last year. In both instances, concomitant NSAID use was felt to be the culprit.  He almost has a normal H&H at this time however his iron deficiency state is striking. An acute, self limiting episode of GI bleeding would be less likely to cause this degree of iron deficiency. It is also interesting that he has vitamin B12 deficiency. Endoscopically small bowel mucosa looked normal but it was not biopsied.  It is also somewhat bothersome to me he has a positive family history of colon cancer in a first-degree relative at a relatively young age and has a personal history  of colonic adenomas.  I have recommended we go ahead and proceed with a diagnostic colonoscopy at this time. Risks, benefits, limitations, alternatives and imponderables have been reviewed with the patient. Questions have  been answered. All parties are agreeable. Colonoscopy will be set up at the hospital in the near future.   I have seen & examined the patient prior to the procedure(s) today and reviewed the history and physical/consultation.  There have been no changes.  After consideration of the risks, benefits, alternatives and imponderables, the patient has consented to the procedure(s).

## 2011-05-22 LAB — TISSUE TRANSGLUTAMINASE, IGA: Tissue Transglutaminase Ab, IgA: 0.3 U/mL (ref <20)

## 2011-05-24 NOTE — Progress Notes (Signed)
Referral faxed to Dr Neijstrom  

## 2011-05-25 ENCOUNTER — Encounter: Payer: Self-pay | Admitting: Internal Medicine

## 2011-05-26 ENCOUNTER — Encounter: Payer: Self-pay | Admitting: Internal Medicine

## 2011-05-26 ENCOUNTER — Encounter (HOSPITAL_COMMUNITY): Payer: Self-pay | Admitting: Internal Medicine

## 2011-05-26 NOTE — Progress Notes (Signed)
I spoke to patient on the telephone last evening. Informed him of his drop in hemoglobin. Celiac screen came back negative. Also spoke to Dr. Ouida Sills 2 days ago. Biopsy results also reviewed. I recommended he have another colonoscopy in 2 years. At this point I would consider hematology evaluation to look into other causes of iron deficiency anemia. I've recommended he see a hematologist. Apparently, Dr. Ouida Sills is lying him up to see Dr. Mariel Sleet

## 2011-05-30 ENCOUNTER — Ambulatory Visit: Payer: Medicare Other | Admitting: Internal Medicine

## 2011-06-01 HISTORY — PX: OTHER SURGICAL HISTORY: SHX169

## 2011-06-07 ENCOUNTER — Other Ambulatory Visit (HOSPITAL_COMMUNITY): Payer: Self-pay | Admitting: Oncology

## 2011-06-07 DIAGNOSIS — D509 Iron deficiency anemia, unspecified: Secondary | ICD-10-CM

## 2011-06-15 ENCOUNTER — Encounter (HOSPITAL_COMMUNITY): Payer: Medicare Other | Attending: Oncology

## 2011-06-15 DIAGNOSIS — D509 Iron deficiency anemia, unspecified: Secondary | ICD-10-CM | POA: Insufficient documentation

## 2011-06-15 LAB — IRON AND TIBC: Iron: 10 ug/dL — ABNORMAL LOW (ref 42–135)

## 2011-06-15 LAB — VITAMIN B12: Vitamin B-12: 395 pg/mL (ref 211–911)

## 2011-06-15 LAB — FOLATE: Folate: 15.2 ng/mL

## 2011-06-15 LAB — DIFFERENTIAL
Basophils Absolute: 0.1 10*3/uL (ref 0.0–0.1)
Eosinophils Relative: 3 % (ref 0–5)
Lymphocytes Relative: 25 % (ref 12–46)
Lymphs Abs: 1.1 10*3/uL (ref 0.7–4.0)
Neutro Abs: 2.6 10*3/uL (ref 1.7–7.7)

## 2011-06-15 LAB — CBC
HCT: 28.6 % — ABNORMAL LOW (ref 39.0–52.0)
MCV: 74.9 fL — ABNORMAL LOW (ref 78.0–100.0)
Platelets: 261 10*3/uL (ref 150–400)
RBC: 3.82 MIL/uL — ABNORMAL LOW (ref 4.22–5.81)
RDW: 20.3 % — ABNORMAL HIGH (ref 11.5–15.5)
WBC: 4.5 10*3/uL (ref 4.0–10.5)

## 2011-06-15 NOTE — Progress Notes (Signed)
Labs drawn today for cbc/diff,vb12,folate,Iron and IBC,Ferritin

## 2011-06-16 ENCOUNTER — Emergency Department (HOSPITAL_COMMUNITY): Payer: Medicare Other

## 2011-06-16 ENCOUNTER — Encounter (HOSPITAL_COMMUNITY): Payer: Self-pay | Admitting: *Deleted

## 2011-06-16 ENCOUNTER — Inpatient Hospital Stay (HOSPITAL_COMMUNITY)
Admission: EM | Admit: 2011-06-16 | Discharge: 2011-06-18 | DRG: 378 | Disposition: A | Discharge status: Other Acute Inpatient Hospital | Payer: Medicare Other | Attending: Internal Medicine | Admitting: Internal Medicine

## 2011-06-16 ENCOUNTER — Other Ambulatory Visit: Payer: Self-pay

## 2011-06-16 DIAGNOSIS — D509 Iron deficiency anemia, unspecified: Secondary | ICD-10-CM | POA: Diagnosis present

## 2011-06-16 DIAGNOSIS — D62 Acute posthemorrhagic anemia: Secondary | ICD-10-CM | POA: Diagnosis present

## 2011-06-16 DIAGNOSIS — R933 Abnormal findings on diagnostic imaging of other parts of digestive tract: Secondary | ICD-10-CM | POA: Diagnosis present

## 2011-06-16 DIAGNOSIS — K2270 Barrett's esophagus without dysplasia: Secondary | ICD-10-CM | POA: Diagnosis present

## 2011-06-16 DIAGNOSIS — K922 Gastrointestinal hemorrhage, unspecified: Principal | ICD-10-CM | POA: Diagnosis present

## 2011-06-16 DIAGNOSIS — R55 Syncope and collapse: Secondary | ICD-10-CM | POA: Diagnosis present

## 2011-06-16 LAB — DIFFERENTIAL
Eosinophils Absolute: 0.1 10*3/uL (ref 0.0–0.7)
Eosinophils Relative: 2 % (ref 0–5)
Lymphs Abs: 1.1 10*3/uL (ref 0.7–4.0)
Monocytes Relative: 13 % — ABNORMAL HIGH (ref 3–12)
Neutrophils Relative %: 64 % (ref 43–77)

## 2011-06-16 LAB — CBC
HCT: 27.1 % — ABNORMAL LOW (ref 39.0–52.0)
Hemoglobin: 8.3 g/dL — ABNORMAL LOW (ref 13.0–17.0)
MCH: 22.7 pg — ABNORMAL LOW (ref 26.0–34.0)
MCV: 74.2 fL — ABNORMAL LOW (ref 78.0–100.0)
RBC: 3.65 MIL/uL — ABNORMAL LOW (ref 4.22–5.81)

## 2011-06-16 LAB — COMPREHENSIVE METABOLIC PANEL
ALT: 18 U/L (ref 0–53)
AST: 21 U/L (ref 0–37)
CO2: 24 mEq/L (ref 19–32)
Chloride: 106 mEq/L (ref 96–112)
Creatinine, Ser: 0.93 mg/dL (ref 0.50–1.35)
GFR calc non Af Amer: 85 mL/min — ABNORMAL LOW (ref >90)
Total Bilirubin: 0.2 mg/dL — ABNORMAL LOW (ref 0.3–1.2)

## 2011-06-16 LAB — URINALYSIS, ROUTINE W REFLEX MICROSCOPIC
Bilirubin Urine: NEGATIVE
Hgb urine dipstick: NEGATIVE
Protein, ur: NEGATIVE mg/dL
Urobilinogen, UA: 0.2 mg/dL (ref 0.0–1.0)

## 2011-06-16 LAB — PROTIME-INR: INR: 1.09 (ref 0.00–1.49)

## 2011-06-16 LAB — D-DIMER, QUANTITATIVE: D-Dimer, Quant: 0.3 ug/mL-FEU (ref 0.00–0.48)

## 2011-06-16 MED ORDER — ACETAMINOPHEN 325 MG PO TABS
650.0000 mg | ORAL_TABLET | Freq: Once | ORAL | Status: AC
  Administered 2011-06-16: 650 mg via ORAL
  Filled 2011-06-16: qty 2

## 2011-06-16 MED ORDER — TETANUS-DIPHTH-ACELL PERTUSSIS 5-2.5-18.5 LF-MCG/0.5 IM SUSP
0.5000 mL | Freq: Once | INTRAMUSCULAR | Status: AC
  Administered 2011-06-16: 0.5 mL via INTRAMUSCULAR
  Filled 2011-06-16: qty 0.5

## 2011-06-16 NOTE — ED Notes (Signed)
Syncope, , struck head on table.  Alert on arrival to ER,LSB and c collar in place.Marland Kitchen

## 2011-06-16 NOTE — ED Provider Notes (Signed)
History     CSN: [id number] Arrival date & time: 06/16/2011  8:45 PM   First MD Initiated Contact with Patient 06/16/11 2106      Chief Complaint  Patient presents with  . Loss of Consciousness    (Consider location/radiation/quality/duration/timing/severity/associated sxs/prior treatment) HPI Comments: 63 male who had a syncopal episode while getting up from the dinner table. He states he felt fine when eating dinner has no arm or getting up or murmurs waking up on the floor. He denies any chest pain, dizziness, lightheadedness, vertigo, shortness of breath or nausea or vomiting. He feels back to baseline now has no weakness, numbness or tingling. Denies abdominal pain. He has a history of GI bleed secondary to AVM in the small bowel and follows with Dr. Jena Gauss. He denies any headache, neck pain, back pain.  The history is provided by the patient and the spouse.    Past Medical History  Diagnosis Date  . Barrett's esophagus 5/11    EGD Dr. Edgardo Roys Barrett's, multiple antral/bulbar erosins, D2 erosion sec NSAIDs  . GERD (gastroesophageal reflux disease)   . HTN (hypertension)   . Hyperlipemia   . Helicobacter pylori gastritis 5/11  . Diverticulosis 5/11    Colonoscopy Dr Jena Gauss  . Hx of adenomatous colonic polyps 8/10    Colonoscopy Dr. Jena Gauss  . Family hx of colon cancer   . Schatzki's ring 5/11    52F dilator  . Anemia     Past Surgical History  Procedure Date  . Capsule endoscopy of the small bowel 02/17/2011       . Esophagogastroduodenoscopy 02/13/2011    Procedure: ESOPHAGOGASTRODUODENOSCOPY (EGD);  Surgeon: Corbin Ade, MD;  Location: [facility];  Service: Endoscopy;  Laterality: N/A;  . Givens capsule study 02/15/2011    Procedure: GIVENS CAPSULE STUDY;  Surgeon: Corbin Ade, MD;  Location: [facility];  Service: Endoscopy;  Laterality: N/A;  . Vasectomy   . Colonoscopy 05/19/2011    Procedure: COLONOSCOPY;  Surgeon: Corbin Ade, MD;  Location:  [facility];  Service: Endoscopy;  Laterality: N/A;  8:30    Family History  Problem Relation Age of Onset  . Prostate cancer Father 2  . Diabetes Mother   . Heart failure Mother   . Colon cancer Brother 72    History  Substance Use Topics  . Smoking status: Former Smoker -- 1.5 packs/day for 30 years    Types: Cigarettes    Quit date: 03/18/1981  . Smokeless tobacco: Not on file  . Alcohol Use: Yes     two drinks a day bourbon      Review of Systems  Constitutional: Negative for fever, activity change and appetite change.  HENT: Negative for congestion and rhinorrhea.   Respiratory: Negative for cough and shortness of breath.   Cardiovascular: Positive for syncope. Negative for chest pain.  Gastrointestinal: Negative for nausea, vomiting, abdominal pain and blood in stool.  Genitourinary: Negative for dysuria and hematuria.  Musculoskeletal: Negative for back pain.  Skin: Negative for pallor.  Neurological: Negative for dizziness, weakness and headaches.    Allergies  Penicillins and Pantoprazole sodium  Home Medications   No current outpatient prescriptions on file.  BP 138/68  Pulse 74  Temp(Src) 99.1 F (37.3 C) (Oral)  Resp 20  Ht 5' 8.5" (1.74 m)  Wt 194 lb 10.7 oz (88.3 kg)  BMI 29.17 kg/m2  SpO2 98%  Physical Exam  Constitutional: He is oriented to person, place, and  time. He appears well-developed and well-nourished. No distress.  HENT:  Head: Normocephalic and atraumatic.  Mouth/Throat: No oropharyngeal exudate.       Forehead abrasion  Eyes: Conjunctivae are normal. Pupils are equal, round, and reactive to light.  Neck: Normal range of motion.       No C-spine pain, step-off or deformity  Cardiovascular: Normal rate, regular rhythm and normal heart sounds.   Pulmonary/Chest: Effort normal and breath sounds normal. No respiratory distress.  Abdominal: Soft. There is no tenderness. There is no rebound and no guarding.  Musculoskeletal:  Normal range of motion. He exhibits no edema and no tenderness.  Neurological: He is alert and oriented to person, place, and time. No cranial nerve deficit.       CN 2-12 intact.  No focal deficits.  Skin: Skin is warm.    ED Course  Procedures (including critical care time)  Labs Reviewed  CBC - Abnormal; Notable for the following:    RBC 3.65 (*)    Hemoglobin 8.3 (*)    HCT 27.1 (*)    MCV 74.2 (*)    MCH 22.7 (*)    RDW 20.8 (*)    All other components within normal limits  DIFFERENTIAL - Abnormal; Notable for the following:    Monocytes Relative 13 (*)    All other components within normal limits  COMPREHENSIVE METABOLIC PANEL - Abnormal; Notable for the following:    Potassium 3.4 (*)    Glucose, Bld 131 (*)    Total Bilirubin 0.2 (*)    GFR calc non Af Amer 85 (*)    All other components within normal limits  CBC - Abnormal; Notable for the following:    RBC 3.49 (*)    Hemoglobin 7.7 (*)    HCT 25.8 (*)    MCV 73.9 (*)    MCH 22.1 (*)    MCHC 29.8 (*)    RDW 20.8 (*)    All other components within normal limits  BASIC METABOLIC PANEL - Abnormal; Notable for the following:    Glucose, Bld 109 (*)    GFR calc non Af Amer 89 (*)    All other components within normal limits  CARDIAC PANEL(CRET KIN+CKTOT+MB+TROPI)  PROTIME-INR  URINALYSIS, ROUTINE W REFLEX MICROSCOPIC  TYPE AND SCREEN  D-DIMER, QUANTITATIVE  MRSA PCR SCREENING  OCCULT BLOOD X 1 CARD TO LAB, STOOL  PREPARE RBC (CROSSMATCH)  TYPE AND SCREEN   Dg Chest 2 View  06/16/2011  *RADIOLOGY REPORT*  Clinical Data: Syncope, fall  CHEST - 2 VIEW  Comparison: None.  Findings: Lungs are clear. No pleural effusion or pneumothorax.  The heart is top normal in size.  Degenerative changes of the visualized thoracolumbar spine.  Old right anterior rib fractures.  IMPRESSION: No evidence of acute cardiopulmonary disease.  Original Report Authenticated By: Charline Bills, M.D.   Ct Head Wo Contrast  06/16/2011   *RADIOLOGY REPORT*  Clinical Data: Fall.  Head injury with loss of consciousness.  CT HEAD WITHOUT CONTRAST  Technique:  Contiguous axial images were obtained from the base of the skull through the vertex without contrast.  Comparison: None.  Findings: There is no evidence of intracranial hemorrhage, brain edema or other signs of acute infarction.  There is no evidence of intracranial mass lesion or mass effect.  No abnormal extra-axial fluid collections are identified.  No evidence of hydrocephalus or other intracranial abnormality.  No skull fracture or other bone lesion identified.  An air-fluid level is  seen in the left maxillary sinus as well as mild bilateral maxillary sinus mucosal thickening.  IMPRESSION:  1.  No evidence of intracranial abnormality or skull fracture. 2.  Bilateral maxillary sinusitis.  Original Report Authenticated By: Danae Orleans, M.D.     1. GI bleed   2. Syncope       MDM  Syncopal episode with close head injury, no prodrome. No chest pain, shortness of breath no dizziness. Patient is at neurological baseline now. Concerning for cardiac etiology given lack of prodrome.  Also patient has history of GI bleed.  Hemoccult positive on exam but hemoglobin is stable. Will need admission for syncope workup, possible transfusion and GI consult.  D/w Dr. Juanetta Gosling.   Date: 06/16/2011  Rate: 87  Rhythm: sinus arrhythmia  QRS Axis: normal  Intervals: normal  ST/T Wave abnormalities: nonspecific ST/T changes  Conduction Disutrbances:none  Narrative Interpretation:   Old EKG Reviewed: none available  CRITICAL CARE Performed by: Glynn Octave   Total critical care time: 30  Critical care time was exclusive of separately billable procedures and treating other patients.  Critical care was necessary to treat or prevent imminent or life-threatening deterioration.  Critical care was time spent personally by me on the following activities: development of treatment plan  with patient and/or surrogate as well as nursing, discussions with consultants, evaluation of patient's response to treatment, examination of patient, obtaining history from patient or surrogate, ordering and performing treatments and interventions, ordering and review of laboratory studies, ordering and review of radiographic studies, pulse oximetry and re-evaluation of patient's condition.         Glynn Octave, MD 06/17/11 1011

## 2011-06-16 NOTE — ED Notes (Signed)
Lac to the top of the head cleaned w/ shurclens. No bleeding at this time. NAD noted, no needs at this time.

## 2011-06-16 NOTE — ED Notes (Signed)
Called, gave report to [employer]

## 2011-06-16 NOTE — ED Notes (Signed)
Pt reports getting up from dinner and "blacking out".  Family told ems that pt hit his head on the table.  Pt has lac to his head.  Bleeding is controlled.  Pt on spine board and c collar.  Family at bedside.  edp notified.

## 2011-06-16 NOTE — H&P (Signed)
David Weiss MRN: [id number] DOB/AGE: [birth year] 65 y.o. Primary Care Physician:FAGAN,ROY, MD Admit date: 06/16/2011 Chief Complaint: GI bleeding and syncope HPI: This is a 65 year old Caucasian male who has been having problems with GI bleeding for the last 5 months or so. He has had multiple evaluations and has been found to have Barrett's esophagus with multiple erosions. He has had problems with bleeding off and on for the last several months. He says he still has dark stools that are heme positive. He saw Dr. Mariel Sleet for hematology consultation earlier this week and had labwork but does not have all the results. He went to stand earlier today and fell and struck his head  Past Medical History  Diagnosis Date  . Barrett's esophagus 5/11    EGD Dr. Edgardo Roys Barrett's, multiple antral/bulbar erosins, D2 erosion sec NSAIDs  . GERD (gastroesophageal reflux disease)   . HTN (hypertension)   . Hyperlipemia   . Helicobacter pylori gastritis 5/11  . Diverticulosis 5/11    Colonoscopy Dr Jena Gauss  . Hx of adenomatous colonic polyps 8/10    Colonoscopy Dr. Jena Gauss  . Family hx of colon cancer   . Schatzki's ring 5/11    67F dilator  . Anemia    Past Surgical History  Procedure Date  . Capsule endoscopy of the small bowel 02/17/2011       . Esophagogastroduodenoscopy 02/13/2011    Procedure: ESOPHAGOGASTRODUODENOSCOPY (EGD);  Surgeon: Corbin Ade, MD;  Location: [facility];  Service: Endoscopy;  Laterality: N/A;  . Givens capsule study 02/15/2011    Procedure: GIVENS CAPSULE STUDY;  Surgeon: Corbin Ade, MD;  Location: [facility];  Service: Endoscopy;  Laterality: N/A;  . Vasectomy   . Colonoscopy 05/19/2011    Procedure: COLONOSCOPY;  Surgeon: Corbin Ade, MD;  Location: [facility];  Service: Endoscopy;  Laterality: N/A;  8:30        Family History  Problem Relation Age of Onset  . Prostate cancer Father 70  . Diabetes Mother   . Heart failure Mother   .  Colon cancer Brother 92    Social History:  reports that he quit smoking about 30 years ago. His smoking use included Cigarettes. He has a 45 pack-year smoking history. He does not have any smokeless tobacco history on file. He reports that he drinks alcohol. He reports that he does not use illicit drugs.   Allergies:  Allergies  Allergen Reactions  . Penicillins Itching    Mother told him in his childhood  . Pantoprazole Sodium     REACTION: rash, itching    Medications Prior to Admission  Medication Dose Route Frequency Provider Last Rate Last Dose  . acetaminophen (TYLENOL) tablet 650 mg  650 mg Oral Once Glynn Octave, MD   650 mg at 06/16/11 2235  . TDaP (BOOSTRIX) injection 0.5 mL  0.5 mL Intramuscular Once Glynn Octave, MD   0.5 mL at 06/16/11 2210   Medications Prior to Admission  Medication Sig Dispense Refill  . acetaminophen (TYLENOL) 500 MG tablet Take 500 mg by mouth daily as needed. Pain Takes about 3 tabs weekly       . amLODipine (NORVASC) 5 MG tablet Take 5 mg by mouth daily.        Marland Kitchen atorvastatin (LIPITOR) 10 MG tablet Take 10 mg by mouth daily.        . benazepril (LOTENSIN) 10 MG tablet Take 10 mg by mouth daily.        Marland Kitchen  halobetasol (ULTRAVATE) 0.05 % cream Apply 1 application topically every other day as needed. Apply to affected areas as needed for flareups       . hydroxypropyl methylcellulose (ISOPTO TEARS) 2.5 % ophthalmic solution Place 1-2 drops into both eyes 2 (two) times daily as needed. Dry eyes/allergies       . omeprazole (PRILOSEC) 20 MG capsule Take 1 capsule (20 mg total) by mouth 1 day or 1 dose.           [id number]:[id number] from the symptoms mentioned above,there are no other symptoms referable to all systems reviewed.  Physical Exam: Blood pressure 146/60, pulse 107, height 5\' 8"  (1.727 m), weight 87.091 kg (192 lb), SpO2 96.00%. He is awake and alert and oriented. He has a laceration on his for head just above the hairline. This is about 5  cm. His pupils are reactive. His nose and throat are clear. His mucous membranes are moist. His neck is supple without masses. His chest is clear. His heart is regular and he does not have a murmur or gallop. His abdomen is soft with no tenderness bowel sounds are present and active. His extremities showed no edema. His central nervous system examination is grossly intact.  Results for orders placed during the hospital encounter of 06/16/11 (from the past 48 hour(s))  CARDIAC PANEL(CRET KIN+CKTOT+MB+TROPI)     Status: Normal   Collection Time   06/16/11  9:22 PM      Component Value Range Comment   Total CK 126  7 - 232 (U/L)    CK, MB 2.5  0.3 - 4.0 (ng/mL)    Troponin I <0.30  <0.30 (ng/mL)    Relative Index 2.0  0.0 - 2.5    CBC     Status: Abnormal   Collection Time   06/16/11  9:22 PM      Component Value Range Comment   WBC 5.9  4.0 - 10.5 (K/uL)    RBC 3.65 (*) 4.22 - 5.81 (MIL/uL)    Hemoglobin 8.3 (*) 13.0 - 17.0 (g/dL)    HCT 54.0 (*) 98.1 - 52.0 (%)    MCV 74.2 (*) 78.0 - 100.0 (fL)    MCH 22.7 (*) 26.0 - 34.0 (pg)    MCHC 30.6  30.0 - 36.0 (g/dL)    RDW 19.1 (*) 47.8 - 15.5 (%)    Platelets 262  150 - 400 (K/uL)   DIFFERENTIAL     Status: Abnormal   Collection Time   06/16/11  9:22 PM      Component Value Range Comment   Neutrophils Relative 64  43 - 77 (%)    Neutro Abs 3.8  1.7 - 7.7 (K/uL)    Lymphocytes Relative 19  12 - 46 (%)    Lymphs Abs 1.1  0.7 - 4.0 (K/uL)    Monocytes Relative 13 (*) 3 - 12 (%)    Monocytes Absolute 0.8  0.1 - 1.0 (K/uL)    Eosinophils Relative 2  0 - 5 (%)    Eosinophils Absolute 0.1  0.0 - 0.7 (K/uL)    Basophils Relative 1  0 - 1 (%)    Basophils Absolute 0.1  0.0 - 0.1 (K/uL)   PROTIME-INR     Status: Normal   Collection Time   06/16/11  9:22 PM      Component Value Range Comment   Prothrombin Time 14.3  11.6 - 15.2 (seconds)    INR 1.09  0.00 - 1.49  COMPREHENSIVE METABOLIC PANEL     Status: Abnormal   Collection Time    06/16/11  9:22 PM      Component Value Range Comment   Sodium 139  135 - 145 (mEq/L)    Potassium 3.4 (*) 3.5 - 5.1 (mEq/L)    Chloride 106  96 - 112 (mEq/L)    CO2 24  19 - 32 (mEq/L)    Glucose, Bld 131 (*) 70 - 99 (mg/dL)    BUN 11  6 - 23 (mg/dL)    Creatinine, Ser 1.61  0.50 - 1.35 (mg/dL)    Calcium 9.2  8.4 - 10.5 (mg/dL)    Total Protein 6.9  6.0 - 8.3 (g/dL)    Albumin 3.9  3.5 - 5.2 (g/dL)    AST 21  0 - 37 (U/L)    ALT 18  0 - 53 (U/L)    Alkaline Phosphatase 80  39 - 117 (U/L)    Total Bilirubin 0.2 (*) 0.3 - 1.2 (mg/dL)    GFR calc non Af Amer 85 (*) >90 (mL/min)    GFR calc Af Amer >90  >90 (mL/min)   TYPE AND SCREEN     Status: Normal   Collection Time   06/16/11  9:22 PM      Component Value Range Comment   ABO/RH(D) A POS      Antibody Screen NEG      Sample Expiration 06/19/2011     D-DIMER, QUANTITATIVE     Status: Normal   Collection Time   06/16/11  9:22 PM      Component Value Range Comment   D-Dimer, Quant 0.30  0.00 - 0.48 (ug/mL-FEU)    No results found for this or any previous visit (from the past 240 hour(s)).  Dg Chest 2 View  06/16/2011  *RADIOLOGY REPORT*  Clinical Data: Syncope, fall  CHEST - 2 VIEW  Comparison: None.  Findings: Lungs are clear. No pleural effusion or pneumothorax.  The heart is top normal in size.  Degenerative changes of the visualized thoracolumbar spine.  Old right anterior rib fractures.  IMPRESSION: No evidence of acute cardiopulmonary disease.  Original Report Authenticated By: Charline Bills, M.D.   Ct Head Wo Contrast  06/16/2011  *RADIOLOGY REPORT*  Clinical Data: Fall.  Head injury with loss of consciousness.  CT HEAD WITHOUT CONTRAST  Technique:  Contiguous axial images were obtained from the base of the skull through the vertex without contrast.  Comparison: None.  Findings: There is no evidence of intracranial hemorrhage, brain edema or other signs of acute infarction.  There is no evidence of intracranial mass  lesion or mass effect.  No abnormal extra-axial fluid collections are identified.  No evidence of hydrocephalus or other intracranial abnormality.  No skull fracture or other bone lesion identified.  An air-fluid level is seen in the left maxillary sinus as well as mild bilateral maxillary sinus mucosal thickening.  IMPRESSION:  1.  No evidence of intracranial abnormality or skull fracture. 2.  Bilateral maxillary sinusitis.  Original Report Authenticated By: Danae Orleans, M.D.   Impression: He has GI bleeding. He has had a syncopal episode. He has been worked up for GI bleeding and is undergoing evaluation from a hematologic point of view. He had a CT that was okay. His hemoglobin level has dropped to 8.3. Active Problems:  * No active hospital problems. *      Plan: Since he has GI bleeding and now has had some hemodynamic instability  I'm going to admit him to the step down unit and check serial hemoglobin and hematocrits. He will need GI consult. He does not need blood now. He is allergic to protonix so I will continue with Prilosec twice a day.      Devin Foskey L 06/16/2011, 11:06 PM

## 2011-06-17 ENCOUNTER — Encounter (HOSPITAL_COMMUNITY): Payer: Self-pay | Admitting: *Deleted

## 2011-06-17 ENCOUNTER — Encounter (HOSPITAL_COMMUNITY)
Admission: EM | Disposition: A | Discharge status: Other Acute Inpatient Hospital | Payer: Self-pay | Source: Home / Self Care | Attending: Pulmonary Disease

## 2011-06-17 DIAGNOSIS — D509 Iron deficiency anemia, unspecified: Secondary | ICD-10-CM

## 2011-06-17 DIAGNOSIS — D62 Acute posthemorrhagic anemia: Secondary | ICD-10-CM | POA: Diagnosis present

## 2011-06-17 DIAGNOSIS — R55 Syncope and collapse: Secondary | ICD-10-CM | POA: Diagnosis present

## 2011-06-17 DIAGNOSIS — K922 Gastrointestinal hemorrhage, unspecified: Secondary | ICD-10-CM | POA: Diagnosis present

## 2011-06-17 LAB — BASIC METABOLIC PANEL
BUN: 10 mg/dL (ref 6–23)
Calcium: 8.8 mg/dL (ref 8.4–10.5)
Creatinine, Ser: 0.84 mg/dL (ref 0.50–1.35)
GFR calc Af Amer: >90 mL/min (ref >90)
GFR calc non Af Amer: 89 mL/min — ABNORMAL LOW (ref >90)

## 2011-06-17 LAB — CBC
HCT: 25.8 % — ABNORMAL LOW (ref 39.0–52.0)
MCH: 22.1 pg — ABNORMAL LOW (ref 26.0–34.0)
MCHC: 29.8 g/dL — ABNORMAL LOW (ref 30.0–36.0)
MCV: 73.9 fL — ABNORMAL LOW (ref 78.0–100.0)
Platelets: 255 10*3/uL (ref 150–400)
RDW: 20.8 % — ABNORMAL HIGH (ref 11.5–15.5)
WBC: 5.5 10*3/uL (ref 4.0–10.5)

## 2011-06-17 LAB — MRSA PCR SCREENING: MRSA by PCR: NEGATIVE

## 2011-06-17 SURGERY — IMAGING PROCEDURE, GI TRACT, INTRALUMINAL, VIA CAPSULE

## 2011-06-17 MED ORDER — ONDANSETRON HCL 4 MG PO TABS: 4.0000 mg | ORAL_TABLET | Freq: Four times a day (QID) | ORAL | Status: DC | PRN

## 2011-06-17 MED ORDER — POLYETHYLENE GLYCOL 3350 17 G PO PACK
17.0000 g | PACK | Freq: Every day | ORAL | Status: DC
  Filled 2011-06-17: qty 1

## 2011-06-17 MED ORDER — ONDANSETRON HCL 4 MG/2ML IJ SOLN: 4.0000 mg | Freq: Four times a day (QID) | INTRAMUSCULAR | Status: DC | PRN

## 2011-06-17 MED ORDER — OMEPRAZOLE 20 MG PO CPDR
20.0000 mg | DELAYED_RELEASE_CAPSULE | Freq: Two times a day (BID) | ORAL | Status: DC
  Administered 2011-06-17 – 2011-06-18 (×2): 20 mg via ORAL
  Filled 2011-06-17 (×5): qty 1

## 2011-06-17 MED ORDER — SODIUM CHLORIDE 0.9 % IJ SOLN
INTRAMUSCULAR | Status: AC
  Filled 2011-06-17: qty 3

## 2011-06-17 MED ORDER — SODIUM CHLORIDE 0.9 % IV SOLN
INTRAVENOUS | Status: DC
  Administered 2011-06-17 – 2011-06-18 (×5): via INTRAVENOUS

## 2011-06-17 MED ORDER — ROSUVASTATIN CALCIUM 20 MG PO TABS
20.0000 mg | ORAL_TABLET | Freq: Every day | ORAL | Status: DC
  Administered 2011-06-17 – 2011-06-18 (×2): 20 mg via ORAL
  Filled 2011-06-17 (×2): qty 1

## 2011-06-17 MED ORDER — HYPROMELLOSE (GONIOSCOPIC) 2.5 % OP SOLN
1.0000 [drp] | Freq: Two times a day (BID) | OPHTHALMIC | Status: DC | PRN
  Filled 2011-06-17: qty 15

## 2011-06-17 MED ORDER — ALUM & MAG HYDROXIDE-SIMETH 200-200-20 MG/5ML PO SUSP: 30.0000 mL | Freq: Four times a day (QID) | ORAL | Status: DC | PRN

## 2011-06-17 MED ORDER — SODIUM CHLORIDE 0.45 % IV SOLN: INTRAVENOUS | Status: DC

## 2011-06-17 MED ORDER — ACETAMINOPHEN 325 MG PO TABS
650.0000 mg | ORAL_TABLET | Freq: Four times a day (QID) | ORAL | Status: DC | PRN
  Administered 2011-06-17: 650 mg via ORAL
  Filled 2011-06-17: qty 2

## 2011-06-17 MED ORDER — SIMETHICONE 80 MG PO CHEW
80.0000 mg | CHEWABLE_TABLET | Freq: Once | ORAL | Status: AC
  Administered 2011-06-17: 80 mg via ORAL
  Filled 2011-06-17: qty 1

## 2011-06-17 MED ORDER — BENAZEPRIL HCL 10 MG PO TABS
10.0000 mg | ORAL_TABLET | Freq: Every day | ORAL | Status: DC
  Administered 2011-06-17 – 2011-06-18 (×2): 10 mg via ORAL
  Filled 2011-06-17 (×2): qty 1

## 2011-06-17 MED ORDER — POLYVINYL ALCOHOL 1.4 % OP SOLN: 1.0000 [drp] | OPHTHALMIC | Status: DC | PRN

## 2011-06-17 MED ORDER — AMLODIPINE BESYLATE 5 MG PO TABS
5.0000 mg | ORAL_TABLET | Freq: Every day | ORAL | Status: DC
  Administered 2011-06-17 – 2011-06-18 (×2): 5 mg via ORAL
  Filled 2011-06-17 (×2): qty 1

## 2011-06-17 MED ORDER — ACETAMINOPHEN 650 MG RE SUPP: 650.0000 mg | Freq: Four times a day (QID) | RECTAL | Status: DC | PRN

## 2011-06-17 MED ORDER — HYDROCODONE-ACETAMINOPHEN 5-325 MG PO TABS
1.0000 | ORAL_TABLET | ORAL | Status: DC | PRN
  Administered 2011-06-17 – 2011-06-18 (×6): 1 via ORAL
  Filled 2011-06-17 (×4): qty 1
  Filled 2011-06-17: qty 2

## 2011-06-17 MED ORDER — HALOBETASOL PROPIONATE 0.05 % EX CREA: 1.0000 "application " | TOPICAL_CREAM | CUTANEOUS | Status: DC | PRN

## 2011-06-17 NOTE — Progress Notes (Signed)
Subjective: He says he still feels very weak. He had an episode of vertigo this morning. He has some pain in his neck but I think is related to his fall. He has no other new complaints. He denies any abdominal pain.  Objective: Vital signs in last 24 hours: Temp:  [98.6 F (37 C)-99.2 F (37.3 C)] 99.1 F (37.3 C) (11/17 0800) Pulse Rate:  [64-107] 74  (11/17 0900) Resp:  [20] 20  (11/17 0009) BP: (109-153)/(54-98) 130/98 mmHg (11/17 0900) SpO2:  [94 %-98 %] 98 % (11/17 0900) Weight:  [87.091 kg (192 lb)-88.5 kg (195 lb 1.7 oz)] 194 lb 10.7 oz (88.3 kg) (11/17 0500) Weight change:  Last BM Date: 06/17/11  Intake/Output from previous day: 11/16 0701 - 11/17 0700 In: 837.5 [I.V.:837.5] Out: -   PHYSICAL EXAM General appearance: alert, cooperative and no distress Resp: clear to auscultation bilaterally Cardio: regular rate and rhythm, S1, S2 normal, no murmur, click, rub or gallop GI: soft, non-tender; bowel sounds normal; no masses,  no organomegaly Extremities: extremities normal, atraumatic, no cyanosis or edema  Lab Results:    Basic Metabolic Panel:  Basename 06/17/11 0432 06/16/11 2122  NA 142 139  K 3.8 3.4*  CL 111 106  CO2 24 24  GLUCOSE 109* 131*  BUN 10 11  CREATININE 0.84 0.93  CALCIUM 8.8 9.2  MG -- --  PHOS -- --   Liver Function Tests:  [facility] 06/16/11 2122  AST 21  ALT 18  ALKPHOS 80  BILITOT 0.2*  PROT 6.9  ALBUMIN 3.9   No results found for this basename: LIPASE:2,AMYLASE:2 in the last 72 hours No results found for this basename: AMMONIA:2 in the last 72 hours CBC:  Basename 06/17/11 0432 06/16/11 2122 06/15/11 0949  WBC 5.5 5.9 --  NEUTROABS -- 3.8 2.6  HGB 7.7* 8.3* --  HCT 25.8* 27.1* --  MCV 73.9* 74.2* --  PLT 255 262 --   Cardiac Enzymes:  Basename 06/16/11 2122  CKTOTAL 126  CKMB 2.5  CKMBINDEX --  TROPONINI <0.30   BNP: No results found for this basename: POCBNP:3 in the last 72 hours D-Dimer:  [facility] 06/16/11  2122  DDIMER 0.30   CBG: No results found for this basename: GLUCAP:6 in the last 72 hours Hemoglobin A1C: No results found for this basename: HGBA1C in the last 72 hours Fasting Lipid Panel: No results found for this basename: CHOL,HDL,LDLCALC,TRIG,CHOLHDL,LDLDIRECT in the last 72 hours Thyroid Function Tests: No results found for this basename: TSH,T4TOTAL,FREET4,T3FREE,THYROIDAB in the last 72 hours Anemia Panel:  Basename 06/15/11 0949  VITAMINB12 395  FOLATE 15.2  FERRITIN 3*  TIBC NOT CALC  IRON 10*  RETICCTPCT --   Coagulation:  Basename 06/16/11 2122  LABPROT 14.3  INR 1.09   Urine Drug Screen:  Alcohol Level: No results found for this basename: ETH:2 in the last 72 hours Urinalysis:  Misc. Labs:  ABGS No results found for this basename: PHART,PCO2,PO2ART,TCO2,HCO3 in the last 72 hours CULTURES Recent Results (from the past 240 hour(s))  MRSA PCR SCREENING     Status: Normal   Collection Time   06/17/11 12:07 AM      Component Value Range Status Comment   MRSA by PCR NEGATIVE  NEGATIVE  Final    Studies/Results: Dg Chest 2 View  06/16/2011  *RADIOLOGY REPORT*  Clinical Data: Syncope, fall  CHEST - 2 VIEW  Comparison: None.  Findings: Lungs are clear. No pleural effusion or pneumothorax.  The heart is top normal  in size.  Degenerative changes of the visualized thoracolumbar spine.  Old right anterior rib fractures.  IMPRESSION: No evidence of acute cardiopulmonary disease.  Original Report Authenticated By: Charline Bills, M.D.   Ct Head Wo Contrast  06/16/2011  *RADIOLOGY REPORT*  Clinical Data: Fall.  Head injury with loss of consciousness.  CT HEAD WITHOUT CONTRAST  Technique:  Contiguous axial images were obtained from the base of the skull through the vertex without contrast.  Comparison: None.  Findings: There is no evidence of intracranial hemorrhage, brain edema or other signs of acute infarction.  There is no evidence of intracranial mass lesion  or mass effect.  No abnormal extra-axial fluid collections are identified.  No evidence of hydrocephalus or other intracranial abnormality.  No skull fracture or other bone lesion identified.  An air-fluid level is seen in the left maxillary sinus as well as mild bilateral maxillary sinus mucosal thickening.  IMPRESSION:  1.  No evidence of intracranial abnormality or skull fracture. 2.  Bilateral maxillary sinusitis.  Original Report Authenticated By: Danae Orleans, M.D.    Medications:  Prior to Admission:  Prescriptions prior to admission  Medication Sig Dispense Refill  . acetaminophen (TYLENOL) 500 MG tablet Take 500 mg by mouth daily as needed. Pain Takes about 3 tabs weekly       . amLODipine (NORVASC) 5 MG tablet Take 5 mg by mouth daily.        Marland Kitchen atorvastatin (LIPITOR) 10 MG tablet Take 10 mg by mouth daily.        . benazepril (LOTENSIN) 10 MG tablet Take 10 mg by mouth daily.        . halobetasol (ULTRAVATE) 0.05 % cream Apply 1 application topically every other day as needed. Apply to affected areas as needed for flareups       . hydroxypropyl methylcellulose (ISOPTO TEARS) 2.5 % ophthalmic solution Place 1-2 drops into both eyes 2 (two) times daily as needed. Dry eyes/allergies       . omeprazole (PRILOSEC) 20 MG capsule Take 1 capsule (20 mg total) by mouth 1 day or 1 dose.       Scheduled:   . acetaminophen  650 mg Oral Once  . amLODipine  5 mg Oral Daily  . benazepril  10 mg Oral Daily  . omeprazole  20 mg Oral BID  . polyethylene glycol  17 g Oral Daily  . rosuvastatin  20 mg Oral Daily  . TDaP  0.5 mL Intramuscular Once   Continuous:   . sodium chloride    . sodium chloride 125 mL/hr at 06/17/11 0800   ZOX:[id number], acetaminophen, alum & mag hydroxide-simeth, halobetasol, HYDROcodone-acetaminophen, ondansetron (ZOFRAN) IV, ondansetron, polyvinyl alcohol, DISCONTD: hydroxypropyl methylcellulose  Assesment: He has GI bleeding. He has dropped his hemoglobin some  more. He is clearly symptomatic from his anemia from GI bleeding. I have discussed his situation with Dr. Karilyn Cota the GI consultant and we are going to go ahead with blood transfusion. Active Problems:  * No active hospital problems. *     Plan: He will have blood transfusion today and have GI consultation.    LOS: 1 day   Quinterious Walraven L 06/17/2011, 9:48 AM

## 2011-06-17 NOTE — Progress Notes (Unsigned)
Patient swallowed Givens Capsule at 1345 and belt was applied.  Device performing properly.  Recorder will come off at 945pm and nurse provided with instruction sheet.  David Weiss made aware of d/c time and how to download.

## 2011-06-17 NOTE — Consult Note (Signed)
NAMEGLOVER, CAPANO                 ACCOUNT NO.:  [id number]  MEDICAL RECORD NO.:  [id number]  LOCATION:  IC01                          FACILITY:  [facility]  PHYSICIAN:  Lionel December, M.D.    DATE OF BIRTH:  [birth year]  DATE OF CONSULTATION:  06/17/2011 DATE OF DISCHARGE:                                CONSULTATION   REASON FOR CONSULTATION:  Recurrent GI bleed and anemia.  HISTORY OF PRESENT ILLNESS:  The patient is a 65 year old Caucasian male who was admitted to Dr. Juanetta Gosling' service via emergency room last night when he presented with a syncopal episode and noted to have heme- positive dark tarry stool. The patient's present illness began in July when he was at the beach and he had diarrhea and had a syncopal episode, but would not go to emergency room.  He had tarry stools for few days.  He was seen by Dr. Ouida Sills and subsequently evaluated by Dr. Jena Gauss and had an EGD on February 09, 2011.  He had no evidence of bleeding lesion.  He was noted to have short segment Barrett esophagus, which had been initially diagnosed in May 2011 and a small sliding hiatal hernia.  He returned for small bowel given capsule study on February 17, 2011.  This study revealed a single nonbleeding AV malformation and proximal small bowel seen on 1 hour, 9 minutes and 12 seconds.  Rest of the bowel was normal.  The patient continued to experience intermittent melena and/or dark stools.  He therefore had colonoscopy by Dr. Jena Gauss, on May 19, 2011.  He was noted to have left-sided diverticula without stigmata of bleeding.  He had multiple small polyps which were reviewed via cold biopsy and/or cold snared.  Four of these turned out to be tubular adenomas, which he has history of and one was non-adenomatous.  He also had celiac antibody panel which was negative.  At this point, Dr. Jena Gauss, felt that his anemia may be multifactorial and recommended evaluation by Dr. Mariel Sleet.  He has an appointment with him next  week.  The patient reports that he has been feeling weak and tired and has noted intermittent tarry stools.  Yesterday evening after he ate his supper he stood up and passed out.  He hit head and sustained superficial wound.  He was brought to emergency room.  He was noted to be anemic with hemoglobin of 8.3 and this morning it was down to 7.7. Dr. Juanetta Gosling, has ordered 2 units of PRBCs and requested the consultation.  The patient denies nausea, vomiting, dysphagia.  His heartburn has been well controlled with medication.  He has had a good appetite and has not lost any weight.  He denies abdominal pain or hematuria.  He does not take any aspirin or other NSAIDs.  HOME MEDICATIONS: 1. Amlodipine 5 mg p.o. daily. 2. Atorvastatin 10 mg p.o. daily. 3. Benazepril 10 mg p.o. daily. 4. Acetaminophen 500 mg 2-3 times a day as needed. 5. Ultravate 0.05% cream to be used p.r.n. as directed. 6. Omeprazole 20 mg p.o. b.i.d. 7. Isopto Tears 1-2 drops to each eye twice daily as needed.  CURRENT MEDICATIONS: 1. Crestor 20 mg p.o.  daily. 2. MiraLax 17 g p.o. daily. 3. Omeprazole 20 mg p.o. b.i.d. 4. Benazepril 10 mg p.o. daily. 5. Amlodipine 5 mg p.o. daily.  PAST MEDICAL HISTORY:  He has been hypertensive for about 5-6 years.  He has chronic GERD.  He has had sporadic symptoms for 10 years, but he was diagnosed with short segment Barrett esophagus by Dr. Jena Gauss, in May 2011.  Biopsies were negative for dysplasia.  He has hyperlipidemia, well controlled with therapy.  He has never had any surgeries.  He was treated for H. pylori infection in May last year with Prevpac.  History of colonic adenomas.  His first colonoscopy was in June 2005 with removal of 2 small polyps.  He had another colonoscopy in August 2010 and again he had 2 small polyps removed via cold biopsy under snare.  His last colonoscopy was in October this year with findings as above.  ALLERGIES:  NEXIUM resulting in  itching.  FAMILY HISTORY:  Father is 13 year old and not in good health.  He has prostate carcinoma which was diagnosed 10 years earlier and he also has mild dementia and CHF.  Mother died at age 54, he was diabetic and had CHF.  He has a brother age 53 who had surgery for colon carcinoma at age 58 and is in remission.  He has another brother age 61 and has a sister age 64 in good health.  SOCIAL HISTORY:  He is married.  He has 2 grown up sons in good health. He is retired Medical illustrator.  He worked for 40 years for [employer]. He smokes cigarettes a pack to pack in half a day for about 17 years and quit on March 18, 1981.  He drinks alcohol socially no more than 2 or 3 drinks a day, this is usually Bour[place].  PHYSICAL EXAMINATION:  VITAL SIGNS: He weighs 194 pounds and 10 ounces. He is 68 inches tall.  Pulse 78 per minute, blood pressure 151/70, respirations 18, and temp is 98. HEENT:  Conjunctivae is pale.  Sclera is nonicteric.  Oropharyngeal mucosa is normal.  Dentition in satisfactory condition. NECK:  No neck masses or thyromegaly noted.  Carotids without bruits. CARDIAC: Regular rhythm.  Normal S1 and S2.  He has faint systolic ejection murmur heard at LLSB and aortic area. LUNGS:  Clear to auscultation. ABDOMEN:  Full.  Bowel sounds are normal.  No bruits noted.  On palpation, soft abdomen without tenderness, organomegaly, or masses. RECTAL:  Deferred as he had 1 at admission. EXTREMITIES:  No peripheral edema, clubbing, or koilonychia noted.  LAB DATA:  From admission WBC 5.9, H and H 8.3 and 27.1, MCV 74.2, platelet count 262 K.  Serum sodium 139, potassium 3.4, chloride 106, CO2 24, glucose 131, BUN 11, creatinine 0.93, calcium 9.2, total bilirubin 0.2, AP 80, AST 21, ALT 18, total protein 6.9 with albumin of 3.9.  INR 1.09.  Lab data from June 15, 2011, serum B12 level was 395, folate 15.2.  Serum iron 10 and serum ferritin was 3 ng/mL.  His serum ferritin was 6  two months ago, when his serum iron was 17.  I have also reviewed small-bowel given capsule imaging study.  He does have a lesion at 1 hour,  9 minutes and 12 seconds.  There is a mucosa with red discoloration in the background of slightly raised bluish mucosa.  ASSESSMENT:  The patient is a 65 year old Caucasian male, who has chronic GERD, complicated by short segment Barrett, well maintained on  antireflux therapy who also has history of colonic adenomas, who now presents with recurrent melena which started first week of July this year.  His workup has included an EGD small-bowel given capsule study and colonoscopy last month.  He now presents with drop in his hemoglobin and he had a syncopal episode.  His anemia profile confirmed that he has iron deficiency.  I suspect he has been losing blood intermittently from this small bowel lesion which may or may not be AV malformation.  This area needs to be re-evaluated and I believe the best way would be to repeat given capsule study before he is subjected to small bowel endoscopy or other studies. I am concerned that this lesion may be more than just AV malformation.  RECOMMENDATIONS:  We will proceed with small-bowel given capsule study today.  I have reviewed the procedure with the patient and his wife and son and they are all agreeable.  I plan to read this capsule possibly early tomorrow morning and make further recommendations.  Appreciate the opportunity to participate in the care of this gentleman.          ______________________________ Lionel December, M.D.     NR/MEDQ  D:  06/17/2011  T:  06/17/2011  Job:  [id number]  cc:   R. Roetta Sessions, MD FACP [facility] [address] [place] [address]  Kingsley Callander. Ouida Sills, MD Fax: [phone]

## 2011-06-18 ENCOUNTER — Inpatient Hospital Stay (HOSPITAL_COMMUNITY): Payer: Medicare Other

## 2011-06-18 DIAGNOSIS — D375 Neoplasm of uncertain behavior of rectum: Secondary | ICD-10-CM

## 2011-06-18 DIAGNOSIS — D378 Neoplasm of uncertain behavior of other specified digestive organs: Secondary | ICD-10-CM

## 2011-06-18 DIAGNOSIS — D371 Neoplasm of uncertain behavior of stomach: Secondary | ICD-10-CM

## 2011-06-18 LAB — DIFFERENTIAL
Eosinophils Absolute: 0.2 10*3/uL (ref 0.0–0.7)
Eosinophils Relative: 2 % (ref 0–5)
Lymphs Abs: 1.6 10*3/uL (ref 0.7–4.0)
Monocytes Relative: 13 % — ABNORMAL HIGH (ref 3–12)

## 2011-06-18 LAB — CBC
Hemoglobin: 9.3 g/dL — ABNORMAL LOW (ref 13.0–17.0)
MCH: 23.8 pg — ABNORMAL LOW (ref 26.0–34.0)
MCHC: 31.5 g/dL (ref 30.0–36.0)
MCV: 75.6 fL — ABNORMAL LOW (ref 78.0–100.0)
RBC: 3.9 MIL/uL — ABNORMAL LOW (ref 4.22–5.81)

## 2011-06-18 LAB — TYPE AND SCREEN: Unit division: 0

## 2011-06-18 MED ORDER — IOHEXOL 300 MG/ML  SOLN
100.0000 mL | Freq: Once | INTRAMUSCULAR | Status: AC | PRN
  Administered 2011-06-18: 100 mL via INTRAVENOUS

## 2011-06-18 NOTE — Progress Notes (Signed)
Pt's transfer to [facility] pending. Report given to nurse at facility. Receiving nurse will be Rica Mast. Pt is aware of reason for transport, and in agreement.

## 2011-06-18 NOTE — Progress Notes (Signed)
Patient has not experienced any more tarry stools. He denies abdominal pain. Is been experiencing headache and neck pain secondary to fall. I reviewed small bowel given capsule study this morning and noted bleeding lesion in proximal jejunum along with a smaller submucosal lesion distally. Lesion in proximal jejunum was suspicious for neoplasm; therefore abdominopelvic CT obtained. This study reveals 41 x 42 x 36 cm enhancing  mass and he has few water lines is lymph node. Given his findings I recommended patient be transferred to [facility] and he is agreeable. I have talked with Dr. Leatha Gilding who is covering for Dr. Carmelia Roller. Patient to be transferred as soon as they have beds available.

## 2011-06-18 NOTE — Progress Notes (Signed)
Subjective: He says he feels better. His stools are not dark now. He received blood yesterday and he is not dizzy and feels stronger. Dr. Patty Sermons help is noted and appreciated. His capsule study is apparently being downloaded  Objective: Vital signs in last 24 hours: Temp:  [98 F (36.7 C)-98.8 F (37.1 C)] 98.2 F (36.8 C) (11/18 0800) Pulse Rate:  [71-97] 71  (11/17 1530) Resp:  [16-18] 18  (11/17 1630) BP: (81-159)/(36-109) 126/68 mmHg (11/18 0800) Weight:  [88.7 kg (195 lb 8.8 oz)] 195 lb 8.8 oz (88.7 kg) (11/18 0500) Weight change: 1.609 kg (3 lb 8.8 oz) Last BM Date: 06/18/11  Intake/Output from previous day: 11/17 0701 - 11/18 0700 In: 4024.6 [I.V.:2902.1; Blood:1122.5] Out: -   PHYSICAL EXAM General appearance: alert, cooperative and no distress Resp: clear to auscultation bilaterally Cardio: regular rate and rhythm, S1, S2 normal, no murmur, click, rub or gallop GI: soft, non-tender; bowel sounds normal; no masses,  no organomegaly Extremities: extremities normal, atraumatic, no cyanosis or edema  Lab Results:    Basic Metabolic Panel:  Basename 06/17/11 0432 06/16/11 2122  NA 142 139  K 3.8 3.4*  CL 111 106  CO2 24 24  GLUCOSE 109* 131*  BUN 10 11  CREATININE 0.84 0.93  CALCIUM 8.8 9.2  MG -- --  PHOS -- --   Liver Function Tests:  [facility] 06/16/11 2122  AST 21  ALT 18  ALKPHOS 80  BILITOT 0.2*  PROT 6.9  ALBUMIN 3.9   No results found for this basename: LIPASE:2,AMYLASE:2 in the last 72 hours No results found for this basename: AMMONIA:2 in the last 72 hours CBC:  Basename 06/17/11 2343 06/17/11 0432 06/16/11 2122  WBC 6.9 5.5 --  NEUTROABS 4.1 -- 3.8  HGB 9.3* 7.7* --  HCT 29.5* 25.8* --  MCV 75.6* 73.9* --  PLT 233 255 --   Cardiac Enzymes:  Basename 06/16/11 2122  CKTOTAL 126  CKMB 2.5  CKMBINDEX --  TROPONINI <0.30   BNP: No results found for this basename: POCBNP:3 in the last 72 hours D-Dimer:  [facility] 06/16/11 2122    DDIMER 0.30   CBG: No results found for this basename: GLUCAP:6 in the last 72 hours Hemoglobin A1C: No results found for this basename: HGBA1C in the last 72 hours Fasting Lipid Panel: No results found for this basename: CHOL,HDL,LDLCALC,TRIG,CHOLHDL,LDLDIRECT in the last 72 hours Thyroid Function Tests: No results found for this basename: TSH,T4TOTAL,FREET4,T3FREE,THYROIDAB in the last 72 hours Anemia Panel:  Basename 06/15/11 0949  VITAMINB12 395  FOLATE 15.2  FERRITIN 3*  TIBC NOT CALC  IRON 10*  RETICCTPCT --   Coagulation:  Basename 06/16/11 2122  LABPROT 14.3  INR 1.09   Urine Drug Screen:  Alcohol Level: No results found for this basename: ETH:2 in the last 72 hours Urinalysis:  Misc. Labs:  ABGS No results found for this basename: PHART,PCO2,PO2ART,TCO2,HCO3 in the last 72 hours CULTURES Recent Results (from the past 240 hour(s))  MRSA PCR SCREENING     Status: Normal   Collection Time   06/17/11 12:07 AM      Component Value Range Status Comment   MRSA by PCR NEGATIVE  NEGATIVE  Final    Studies/Results: Dg Chest 2 View  06/16/2011  *RADIOLOGY REPORT*  Clinical Data: Syncope, fall  CHEST - 2 VIEW  Comparison: None.  Findings: Lungs are clear. No pleural effusion or pneumothorax.  The heart is top normal in size.  Degenerative changes of the visualized thoracolumbar  spine.  Old right anterior rib fractures.  IMPRESSION: No evidence of acute cardiopulmonary disease.  Original Report Authenticated By: Charline Bills, M.D.   Ct Head Wo Contrast  06/16/2011  *RADIOLOGY REPORT*  Clinical Data: Fall.  Head injury with loss of consciousness.  CT HEAD WITHOUT CONTRAST  Technique:  Contiguous axial images were obtained from the base of the skull through the vertex without contrast.  Comparison: None.  Findings: There is no evidence of intracranial hemorrhage, brain edema or other signs of acute infarction.  There is no evidence of intracranial mass lesion or  mass effect.  No abnormal extra-axial fluid collections are identified.  No evidence of hydrocephalus or other intracranial abnormality.  No skull fracture or other bone lesion identified.  An air-fluid level is seen in the left maxillary sinus as well as mild bilateral maxillary sinus mucosal thickening.  IMPRESSION:  1.  No evidence of intracranial abnormality or skull fracture. 2.  Bilateral maxillary sinusitis.  Original Report Authenticated By: Danae Orleans, M.D.    Medications:  Prior to Admission:  Prescriptions prior to admission  Medication Sig Dispense Refill  . acetaminophen (TYLENOL) 500 MG tablet Take 500 mg by mouth daily as needed. Pain Takes about 3 tabs weekly       . amLODipine (NORVASC) 5 MG tablet Take 5 mg by mouth daily.        Marland Kitchen atorvastatin (LIPITOR) 10 MG tablet Take 10 mg by mouth daily.        . benazepril (LOTENSIN) 10 MG tablet Take 10 mg by mouth daily.        . halobetasol (ULTRAVATE) 0.05 % cream Apply 1 application topically every other day as needed. Apply to affected areas as needed for flareups       . hydroxypropyl methylcellulose (ISOPTO TEARS) 2.5 % ophthalmic solution Place 1-2 drops into both eyes 2 (two) times daily as needed. Dry eyes/allergies       . omeprazole (PRILOSEC) 20 MG capsule Take 1 capsule (20 mg total) by mouth 1 day or 1 dose.       Scheduled:   . amLODipine  5 mg Oral Daily  . benazepril  10 mg Oral Daily  . omeprazole  20 mg Oral BID  . polyethylene glycol  17 g Oral Daily  . rosuvastatin  20 mg Oral Daily  . simethicone  80 mg Oral Once  . sodium chloride      . sodium chloride       Continuous:   . sodium chloride    . sodium chloride 125 mL/hr at 06/18/11 0800   ZOX:[id number], acetaminophen, alum & mag hydroxide-simeth, halobetasol, HYDROcodone-acetaminophen, ondansetron (ZOFRAN) IV, ondansetron, polyvinyl alcohol  Assesment: He has had GI bleeding. He has completed the capsule study and is being downloaded now.  Depending on the results of that he may need further treatment. He has received blood and feels better. His hemoglobin level has improved. Principal Problem:  *GI bleeding Active Problems:  BARRETTS ESOPHAGUS  Iron deficiency anemia  Acute blood loss anemia  Syncope    Plan: After the capsule study as interpreted I think we will have a better idea of how to proceed    LOS: 2 days   Jaziel Bennett L 06/18/2011, 9:29 AM

## 2011-06-18 NOTE — Op Note (Signed)
David Weiss, David Weiss                 ACCOUNT NO.:  [id number]  MEDICAL RECORD NO.:  [id number]  LOCATION:  IC01                          FACILITY:  [facility]  PHYSICIAN:  Lionel December, M.D.    DATE OF BIRTH:  [birth year]  DATE OF PROCEDURE:  06/17/2011 DATE OF DISCHARGE:                              OPERATIVE REPORT   PROCEDURE:  Small bowel Given capsule study.  SURGEON:  Lionel December, MD  INDICATION:  The patient is a 65 year old Caucasian male with recurrent GI bleed and anemia.  He had a Given capsule study in July 2012 following essentially negative EGD..  It was felt that he had  Non-bleeding AV malformation in proximal small bowel. Four weeks ago he had colonoscopy but no source of bleed identified. He now presents with syncopal episode and tarry stools.  His hemoglobin dropped to 7.7 and he has received 2 units of PRBCs.  He is undergoing this study to have another look at this lesion, small bowel, and determine if that is indeed the source of his bleed.  Procedure was reviewed with the patient and informed consent was obtained.  FINDINGS:  The patient was able to swallow Given capsule without any difficulty.  Capsule reached the ileocecal valve in 3 hours and 17 minutes.  Study duration was 8 hours.  Transit time to stomach was 18 seconds, to duodenal bulb was 6 minutes and 38 seconds.  At 26 minutes and 14 seconds, there is a lesion which was covered with blood and clot and just distal to this there is a pool of fresh blood. This would correspond to what was seen on prior study, although the timing is different because of rapid transit. Another small submucosal lesion seen at 2 hours, 27 minutes and 8 seconds.  Rest of the small bowel was normal.  Capsule reached the cecum in 3 hours and 17 minutes.  There was some burgundy blood in the ascending colon, felt to be from small bowel.  FINAL DIAGNOSIS:  Lesion at proximal small bowel or jejunum, best seen on image at 26  minutes and 14 seconds with stigmata of active bleed.  While this lesion is not clearly seen because of clot and blood, suspect this may be hemangioma or neoplasm. Another small submucosal lesion located distally as above.  RECOMMENDATIONS:  We will proceed with abdominopelvic CT with contrast today.  We will discuss further options with the patient and his physicians which include surgical excision or referral to tertiary center for small bowel endoscopy.          ______________________________ Lionel December, M.D.     NR/MEDQ  D:  06/18/2011  T:  06/18/2011  Job:  [id number]  cc:   R. Roetta Sessions, MD FACP [facility] [address] [place] [address]

## 2011-06-18 NOTE — Progress Notes (Signed)
Patient left via [facility] to [facility]. Family present. Patient had no complaints of pain.

## 2011-06-19 DIAGNOSIS — R1900 Intra-abdominal and pelvic swelling, mass and lump, unspecified site: Secondary | ICD-10-CM | POA: Insufficient documentation

## 2011-06-19 DIAGNOSIS — I10 Essential (primary) hypertension: Secondary | ICD-10-CM | POA: Insufficient documentation

## 2011-06-20 ENCOUNTER — Ambulatory Visit (HOSPITAL_COMMUNITY): Payer: Medicare Other | Admitting: Oncology

## 2011-06-22 DIAGNOSIS — Z9049 Acquired absence of other specified parts of digestive tract: Secondary | ICD-10-CM | POA: Insufficient documentation

## 2011-07-04 DIAGNOSIS — C49A3 Gastrointestinal stromal tumor of small intestine: Secondary | ICD-10-CM | POA: Insufficient documentation

## 2011-07-13 NOTE — Discharge Summary (Signed)
Physician Discharge Summary  Patient ID: David Weiss MRN: [id number] DOB/AGE: [birth year] 65 y.o. Primary Care Physician:FAGAN,ROY, MD Admit date: 06/16/2011 Discharge date: 07/13/2011    Discharge Diagnoses:   Principal Problem:  *GI bleeding Active Problems:  BARRETTS ESOPHAGUS  Iron deficiency anemia  Acute blood loss anemia  Syncope   Discharge Medication List as of 06/18/2011  8:20 PM    CONTINUE these medications which have NOT CHANGED   Details  acetaminophen (TYLENOL) 500 MG tablet Take 500 mg by mouth daily as needed. Pain Takes about 3 tabs weekly , Until Discontinued, Historical Med    amLODipine (NORVASC) 5 MG tablet Take 5 mg by mouth daily.  , Until Discontinued, Historical Med    atorvastatin (LIPITOR) 10 MG tablet Take 10 mg by mouth daily.  , Until Discontinued, Historical Med    benazepril (LOTENSIN) 10 MG tablet Take 10 mg by mouth daily.  , Until Discontinued, Historical Med    halobetasol (ULTRAVATE) 0.05 % cream Apply 1 application topically every other day as needed. Apply to affected areas as needed for flareups , Until Discontinued, Historical Med    hydroxypropyl methylcellulose (ISOPTO TEARS) 2.5 % ophthalmic solution Place 1-2 drops into both eyes 2 (two) times daily as needed. Dry eyes/allergies , Until Discontinued, Historical Med    omeprazole (PRILOSEC) 20 MG capsule Take 1 capsule (20 mg total) by mouth 1 day or 1 dose., Starting 02/13/2011, Until Discontinued, No Print        Discharged Condition: Improved    Consults: Gastroenterology  Significant Diagnostic Studies: Dg Chest 2 View  06/16/2011  *RADIOLOGY REPORT*  Clinical Data: Syncope, fall  CHEST - 2 VIEW  Comparison: None.  Findings: Lungs are clear. No pleural effusion or pneumothorax.  The heart is top normal in size.  Degenerative changes of the visualized thoracolumbar spine.  Old right anterior rib fractures.  IMPRESSION: No evidence of acute cardiopulmonary disease.   Original Report Authenticated By: Charline Bills, M.D.   Ct Head Wo Contrast  06/16/2011  *RADIOLOGY REPORT*  Clinical Data: Fall.  Head injury with loss of consciousness.  CT HEAD WITHOUT CONTRAST  Technique:  Contiguous axial images were obtained from the base of the skull through the vertex without contrast.  Comparison: None.  Findings: There is no evidence of intracranial hemorrhage, brain edema or other signs of acute infarction.  There is no evidence of intracranial mass lesion or mass effect.  No abnormal extra-axial fluid collections are identified.  No evidence of hydrocephalus or other intracranial abnormality.  No skull fracture or other bone lesion identified.  An air-fluid level is seen in the left maxillary sinus as well as mild bilateral maxillary sinus mucosal thickening.  IMPRESSION:  1.  No evidence of intracranial abnormality or skull fracture. 2.  Bilateral maxillary sinusitis.  Original Report Authenticated By: Danae Orleans, M.D.   Ct Abdomen Pelvis W Contrast  06/21/2011  **ADDENDUM** CREATED: 06/21/2011 11:22:26  The original report was by Dr. Gaylyn Rong.  The following addendum is by Dr. Gaylyn Rong:  Dr. Karilyn Cota and I met to review the images on the morning of 06/20/2011, and we discussed the possibility of the small bowel mass actually representing an inflamed diverticulum.  I indicated that diverticulum seems a less likely differential diagnostic consideration given the lack of any gas or contrast within the lesion and given the lack of surrounding mesenteric stranding to indicate inflammation despite the thick-walled appearance of the lesion.  **END ADDENDUM** SIGNED BY: Soyla Murphy.  Ova Freshwater, M.D.   06/21/2011  *RADIOLOGY REPORT*  Clinical Data: Rectal bleeding.  Barrett's esophagus.  Gastritis. Small bowel lesion.  CT ABDOMEN AND PELVIS WITH CONTRAST  Technique:  Multidetector CT imaging of the abdomen and pelvis was performed following the standard protocol  during bolus administration of intravenous contrast.  Contrast: OMNIPAQUE IOHEXOL 300 MG/ML IV SOLN  Comparison: None.  Findings: Subsegmental atelectasis or scarring is noted in the posterior basal segment right lower lobe.  The liver, spleen, pancreas, and adrenal glands appear unremarkable.  The gallbladder and biliary system appear unremarkable.  A portacaval node has a short axis diameter of 1.4 cm on image 26 of series 2.  A porta hepatis node on image 22 of series 2 measures 1 cm in diameter.  Several small gastrohepatic ligament lymph nodes are present, and scattered small mesenteric lymph nodes are present.  Several small left kidney lower pole parapelvic cysts are present. Kidneys appear otherwise unremarkable.  About midway through the small bowel, we demonstrate a 4.1 x 3.6 by 4.2 cm enhancing mass with central lack of enhancement suggesting necrosis extending exophytic from the wall of the small bowel. This is shown on image 62 of series 2.  No definite immediately adjacent adenopathy is observed.  No dilation of the adjacent small bowel is noted - the loop of small bowel from which this arises is nondistended.  Descending and proximal sigmoid colon diverticula are present. There is borderline wall thickening in the distal sigmoid and rectum.  Urinary bladder appears unremarkable.  A foreign body suggesting Givens capsule is present in the cecum.  Moderate lumbar spondylosis noted.  IMPRESSION:  1.  Mid small bowel exophytic centrally necrotic mass in the left lower quadrant.  Differential diagnostic considerations include GI stromal tumor, adenocarcinoma, leiomyoma, leiomyosarcoma, and lymphoma.  Resection is recommended. 2.  There are to borderline sized lymph nodes in the porta hepatis region.  No liver masses observed. 3.  Lumbar spondylosis. 4.  Subsegmental atelectasis or scarring in the right lower lobe. 5.  Givens capsule in the cecum.  Original Report Authenticated By: Dellia Cloud, M.D.    Lab Results: Basic Metabolic Panel: No results found for this basename: NA:2,K:2,CL:2,CO2:2,GLUCOSE:2,BUN:2,CREATININE:2,CALCIUM:2,MG:2,PHOS:2 in the last 72 hours Liver Function Tests: No results found for this basename: AST:2,ALT:2,ALKPHOS:2,BILITOT:2,PROT:2,ALBUMIN:2 in the last 72 hours   CBC: No results found for this basename: WBC:2,NEUTROABS:2,HGB:2,HCT:2,MCV:2,PLT:2 in the last 72 hours  No results found for this or any previous visit (from the past 240 hour(s)).   Hospital Course: He was admitted with GI bleeding which had been going on for some months. He's had multiple procedures but no definitive diagnosis had been made. He had blood in his stool he became weak and fell. He was transfused 2 units of packed red blood cells and had gastroenterology consult. He had a repeat capsule study and this showed that he had what appeared to be a small bowel tumor. It was felt that this is going to need further evaluation and potentially surgery and he was transferred to [facility] for this  Discharge Exam: Blood pressure 143/84, pulse 72, temperature 98.3 F (36.8 C), temperature source Oral, resp. rate 18, height 5' 8.5" (1.74 m), weight 88.7 kg (195 lb 8.8 oz), SpO2 98.00%. He was awake and alert his chest was clear his heart was regular his abdomen is soft  Disposition: Transfer to [facility]      Signed: Fredirick Maudlin Pager [phone]  07/13/2011, 7:25 AM

## 2011-07-24 ENCOUNTER — Telehealth: Payer: Self-pay | Admitting: Internal Medicine

## 2011-07-24 NOTE — Telephone Encounter (Signed)
I made a house call to see David Weiss,  Friday afternoon, December 21. Dr. Karilyn Cota I just received the pathology report. I reviewed it with him. I feel his outlook is good from the standpoint of the GIST tumor. He is going to followup with Dr. Lin Givens, oncologist over at [facility].

## 2011-08-02 DIAGNOSIS — Z09 Encounter for follow-up examination after completed treatment for conditions other than malignant neoplasm: Secondary | ICD-10-CM | POA: Diagnosis not present

## 2011-08-02 DIAGNOSIS — C499 Malignant neoplasm of connective and soft tissue, unspecified: Secondary | ICD-10-CM | POA: Diagnosis not present

## 2011-08-02 DIAGNOSIS — R1900 Intra-abdominal and pelvic swelling, mass and lump, unspecified site: Secondary | ICD-10-CM | POA: Diagnosis not present

## 2011-08-22 ENCOUNTER — Telehealth: Payer: Self-pay | Admitting: Internal Medicine

## 2011-08-22 NOTE — Telephone Encounter (Signed)
Luster Landsberg from [place] office called to verify if this patient was [facility]'s patient.  I told her that Mr Klinke was well known in coming to [facility] and was a [facility] ONLY patient.  She said that patient came to their office and stated that he was NOT a [facility] patient and he wanted to make an appointment with [facility].  The receptionist explained to Mr Domangue that he was an established patient of [facility] and they could not make him an OV with [facility]. Patient was very aggravated and left their office.  A few minutes later Lupita Leash the receptionist from [facility] called me back to say she had called Mr Bolender at home and he was VERY ugly to her on the phone. She said that he was not happy with [facility] and would not be coming back to [facility] and she apologized for him not being happy but they still could not take him as a [facility] patient and he could find another GI doctor in [place].

## 2011-08-23 NOTE — Telephone Encounter (Signed)
Above noted 

## 2011-08-25 DIAGNOSIS — I10 Essential (primary) hypertension: Secondary | ICD-10-CM | POA: Diagnosis not present

## 2011-08-25 DIAGNOSIS — E785 Hyperlipidemia, unspecified: Secondary | ICD-10-CM | POA: Diagnosis not present

## 2011-08-25 DIAGNOSIS — Z79899 Other long term (current) drug therapy: Secondary | ICD-10-CM | POA: Diagnosis not present

## 2011-08-25 DIAGNOSIS — M109 Gout, unspecified: Secondary | ICD-10-CM | POA: Diagnosis not present

## 2011-08-25 DIAGNOSIS — D649 Anemia, unspecified: Secondary | ICD-10-CM | POA: Diagnosis not present

## 2011-08-25 DIAGNOSIS — Z125 Encounter for screening for malignant neoplasm of prostate: Secondary | ICD-10-CM | POA: Diagnosis not present

## 2011-09-11 ENCOUNTER — Ambulatory Visit (HOSPITAL_COMMUNITY)
Admission: RE | Admit: 2011-09-11 | Discharge: 2011-09-11 | Disposition: A | Discharge status: Home / Self Care | Payer: Medicare Other | Source: Ambulatory Visit | Attending: Internal Medicine | Admitting: Internal Medicine

## 2011-09-11 ENCOUNTER — Other Ambulatory Visit (HOSPITAL_COMMUNITY): Payer: Self-pay | Admitting: Internal Medicine

## 2011-09-11 DIAGNOSIS — K469 Unspecified abdominal hernia without obstruction or gangrene: Secondary | ICD-10-CM

## 2011-09-11 DIAGNOSIS — R1033 Periumbilical pain: Secondary | ICD-10-CM | POA: Diagnosis not present

## 2011-09-11 DIAGNOSIS — R1031 Right lower quadrant pain: Secondary | ICD-10-CM | POA: Diagnosis not present

## 2011-09-11 DIAGNOSIS — R109 Unspecified abdominal pain: Secondary | ICD-10-CM | POA: Diagnosis not present

## 2011-09-29 DIAGNOSIS — D649 Anemia, unspecified: Secondary | ICD-10-CM | POA: Diagnosis not present

## 2011-10-26 DIAGNOSIS — D649 Anemia, unspecified: Secondary | ICD-10-CM | POA: Diagnosis not present

## 2011-11-28 ENCOUNTER — Encounter (INDEPENDENT_AMBULATORY_CARE_PROVIDER_SITE_OTHER): Payer: Self-pay

## 2012-01-03 DIAGNOSIS — D649 Anemia, unspecified: Secondary | ICD-10-CM | POA: Diagnosis not present

## 2012-01-11 DIAGNOSIS — R1900 Intra-abdominal and pelvic swelling, mass and lump, unspecified site: Secondary | ICD-10-CM | POA: Diagnosis not present

## 2012-01-11 DIAGNOSIS — Z1289 Encounter for screening for malignant neoplasm of other sites: Secondary | ICD-10-CM | POA: Diagnosis not present

## 2012-01-11 DIAGNOSIS — C499 Malignant neoplasm of connective and soft tissue, unspecified: Secondary | ICD-10-CM | POA: Diagnosis not present

## 2012-01-16 DIAGNOSIS — C494 Malignant neoplasm of connective and soft tissue of abdomen: Secondary | ICD-10-CM | POA: Diagnosis not present

## 2012-04-04 DIAGNOSIS — D649 Anemia, unspecified: Secondary | ICD-10-CM | POA: Diagnosis not present

## 2012-04-16 DIAGNOSIS — H2510 Age-related nuclear cataract, unspecified eye: Secondary | ICD-10-CM | POA: Diagnosis not present

## 2012-04-29 DIAGNOSIS — L821 Other seborrheic keratosis: Secondary | ICD-10-CM | POA: Diagnosis not present

## 2012-04-29 DIAGNOSIS — D235 Other benign neoplasm of skin of trunk: Secondary | ICD-10-CM | POA: Diagnosis not present

## 2012-04-29 DIAGNOSIS — L259 Unspecified contact dermatitis, unspecified cause: Secondary | ICD-10-CM | POA: Diagnosis not present

## 2012-05-27 DIAGNOSIS — Z23 Encounter for immunization: Secondary | ICD-10-CM | POA: Diagnosis not present

## 2012-07-16 DIAGNOSIS — Z0389 Encounter for observation for other suspected diseases and conditions ruled out: Secondary | ICD-10-CM | POA: Diagnosis not present

## 2012-07-16 DIAGNOSIS — D649 Anemia, unspecified: Secondary | ICD-10-CM | POA: Diagnosis not present

## 2012-07-16 DIAGNOSIS — K5730 Diverticulosis of large intestine without perforation or abscess without bleeding: Secondary | ICD-10-CM | POA: Diagnosis not present

## 2012-07-16 DIAGNOSIS — C494 Malignant neoplasm of connective and soft tissue of abdomen: Secondary | ICD-10-CM | POA: Diagnosis not present

## 2012-09-26 DIAGNOSIS — Z23 Encounter for immunization: Secondary | ICD-10-CM | POA: Diagnosis not present

## 2012-10-11 DIAGNOSIS — E785 Hyperlipidemia, unspecified: Secondary | ICD-10-CM | POA: Diagnosis not present

## 2012-10-11 DIAGNOSIS — I10 Essential (primary) hypertension: Secondary | ICD-10-CM | POA: Diagnosis not present

## 2013-01-14 DIAGNOSIS — K432 Incisional hernia without obstruction or gangrene: Secondary | ICD-10-CM | POA: Diagnosis not present

## 2013-01-14 DIAGNOSIS — K5730 Diverticulosis of large intestine without perforation or abscess without bleeding: Secondary | ICD-10-CM | POA: Diagnosis not present

## 2013-01-14 DIAGNOSIS — D481 Neoplasm of uncertain behavior of connective and other soft tissue: Secondary | ICD-10-CM | POA: Diagnosis not present

## 2013-01-14 DIAGNOSIS — C494 Malignant neoplasm of connective and soft tissue of abdomen: Secondary | ICD-10-CM | POA: Diagnosis not present

## 2013-01-28 HISTORY — PX: OTHER SURGICAL HISTORY: SHX169

## 2013-02-07 DIAGNOSIS — Z87891 Personal history of nicotine dependence: Secondary | ICD-10-CM | POA: Diagnosis not present

## 2013-02-07 DIAGNOSIS — K432 Incisional hernia without obstruction or gangrene: Secondary | ICD-10-CM | POA: Diagnosis not present

## 2013-02-07 DIAGNOSIS — K2270 Barrett's esophagus without dysplasia: Secondary | ICD-10-CM | POA: Diagnosis not present

## 2013-02-07 DIAGNOSIS — D649 Anemia, unspecified: Secondary | ICD-10-CM | POA: Diagnosis not present

## 2013-02-07 DIAGNOSIS — I10 Essential (primary) hypertension: Secondary | ICD-10-CM | POA: Diagnosis not present

## 2013-02-07 DIAGNOSIS — K219 Gastro-esophageal reflux disease without esophagitis: Secondary | ICD-10-CM | POA: Diagnosis not present

## 2013-02-07 DIAGNOSIS — E785 Hyperlipidemia, unspecified: Secondary | ICD-10-CM | POA: Diagnosis not present

## 2013-02-07 DIAGNOSIS — K439 Ventral hernia without obstruction or gangrene: Secondary | ICD-10-CM | POA: Diagnosis not present

## 2013-02-08 DIAGNOSIS — K432 Incisional hernia without obstruction or gangrene: Secondary | ICD-10-CM | POA: Insufficient documentation

## 2013-02-12 DIAGNOSIS — R945 Abnormal results of liver function studies: Secondary | ICD-10-CM | POA: Diagnosis not present

## 2013-02-14 DIAGNOSIS — R945 Abnormal results of liver function studies: Secondary | ICD-10-CM | POA: Diagnosis not present

## 2013-02-20 DIAGNOSIS — R7301 Impaired fasting glucose: Secondary | ICD-10-CM | POA: Diagnosis not present

## 2013-02-20 DIAGNOSIS — I10 Essential (primary) hypertension: Secondary | ICD-10-CM | POA: Diagnosis not present

## 2013-03-04 DIAGNOSIS — K439 Ventral hernia without obstruction or gangrene: Secondary | ICD-10-CM | POA: Diagnosis not present

## 2013-05-26 DIAGNOSIS — Z23 Encounter for immunization: Secondary | ICD-10-CM | POA: Diagnosis not present

## 2013-06-04 IMAGING — CT CT ABDOMEN W/O CM
2 of 7 series · 14 of 46 positions shown, 18 images · non-contrast
Comparison: CT scan dated 06/19/2011

CLINICAL DATA: Pain and palpable firmness to the right of the
umbilicus.

CT ABDOMEN WITHOUT CONTRAST
TECHNIQUE: Multidetector CT imaging of the abdomen was performed
following the standard protocol without IV contrast.

[Series 2: abdomen/pelvis w/o contrast · axial · non-contrast · 0.79mm/px · z∈[-288,-74]mm · 11 of 49 slices shown, 15 images]
[im 3/49  soft-tissue]
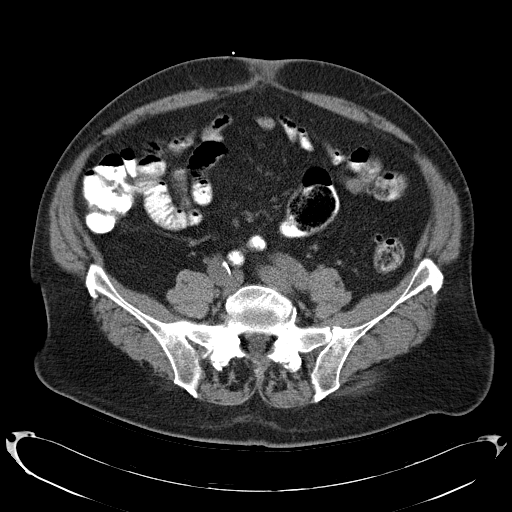
[im 3/49  bone]
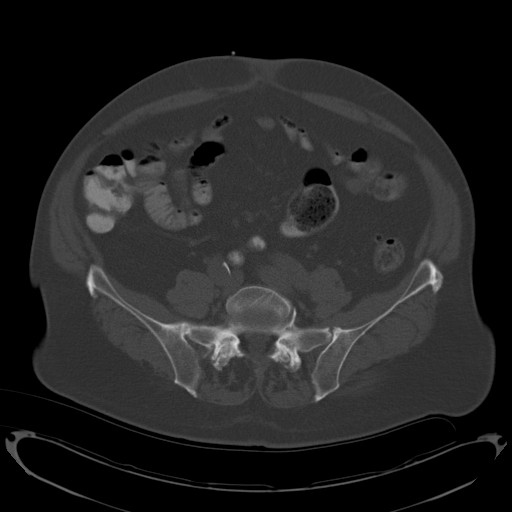
[im 9/49  soft-tissue]
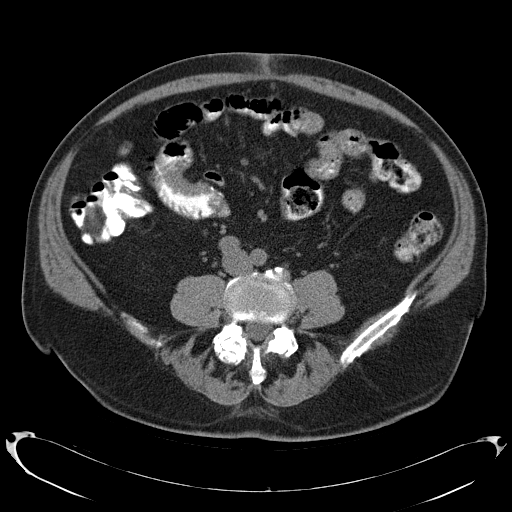
[im 15/49  soft-tissue]
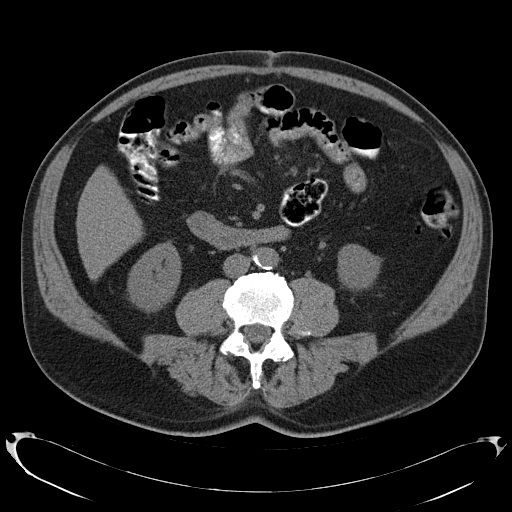
[im 20/49  soft-tissue]
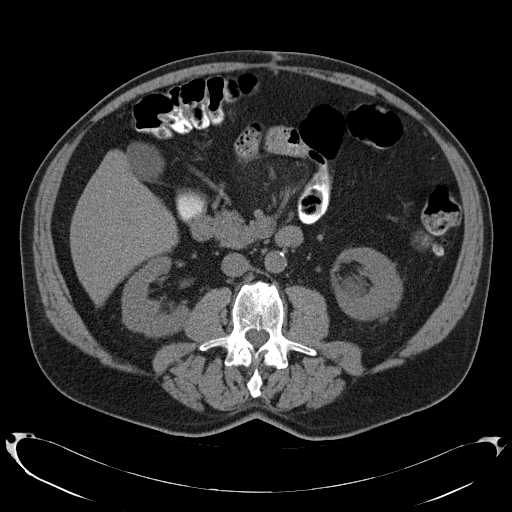
[im 26/49  soft-tissue]
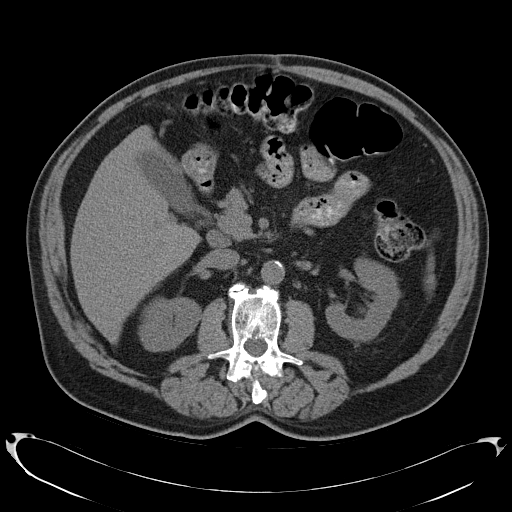
[im 29/49  soft-tissue]
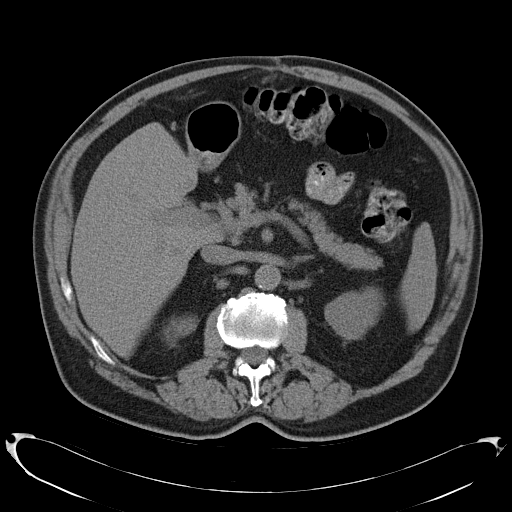
[im 34/49  soft-tissue]
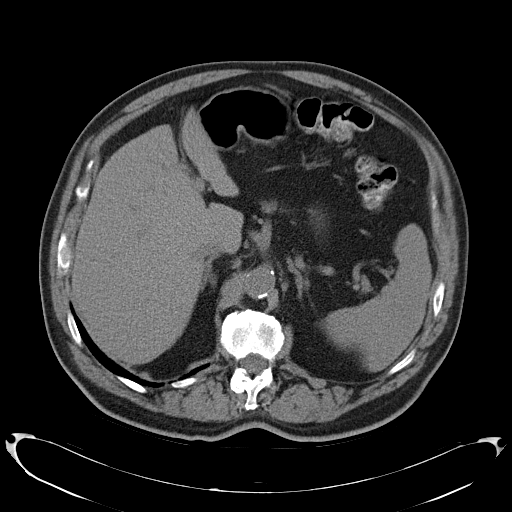
[im 37/49  lung]
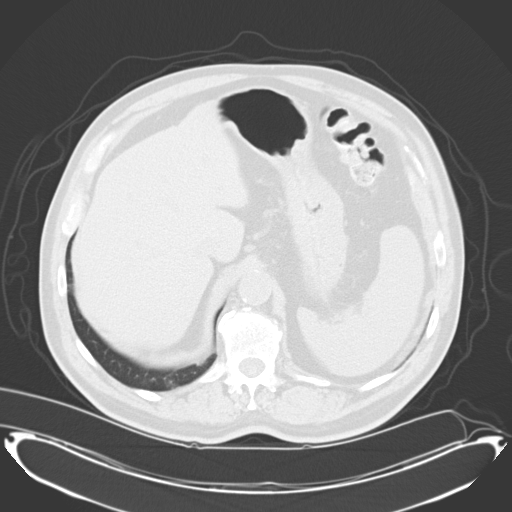
[im 40/49  soft-tissue]
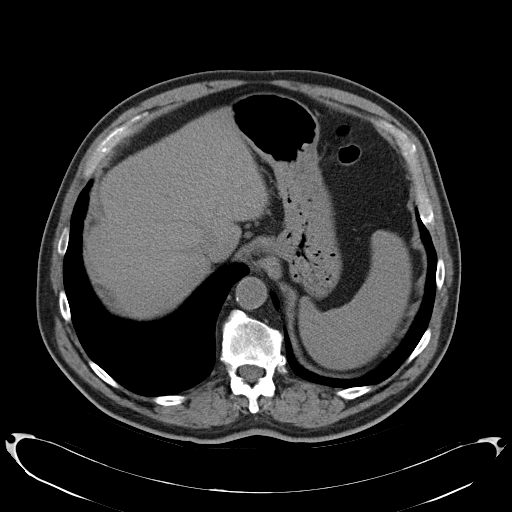
[im 40/49  lung]
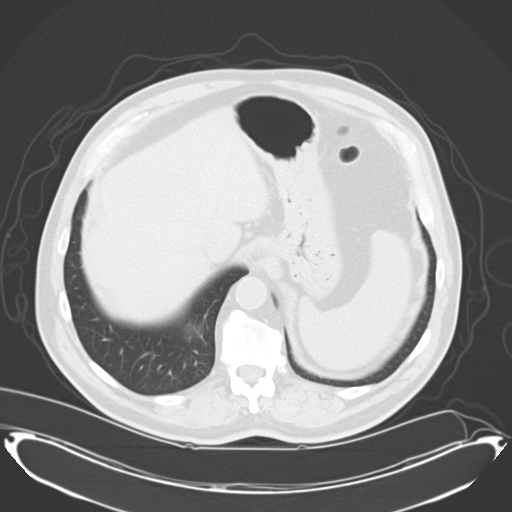
[im 43/49  lung]
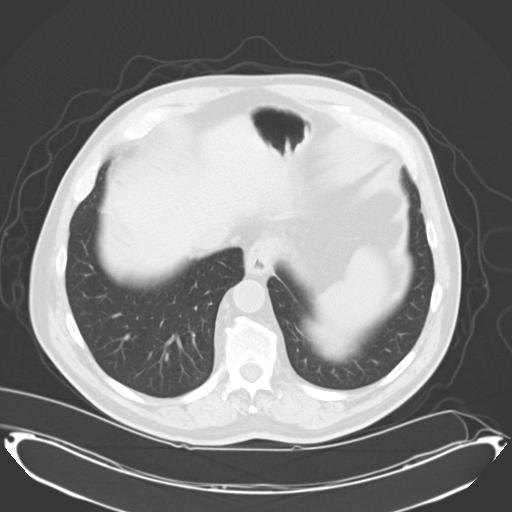
[im 46/49  soft-tissue]
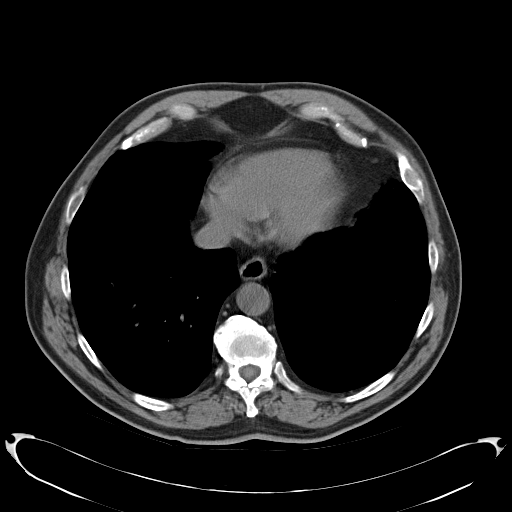
[im 46/49  lung]
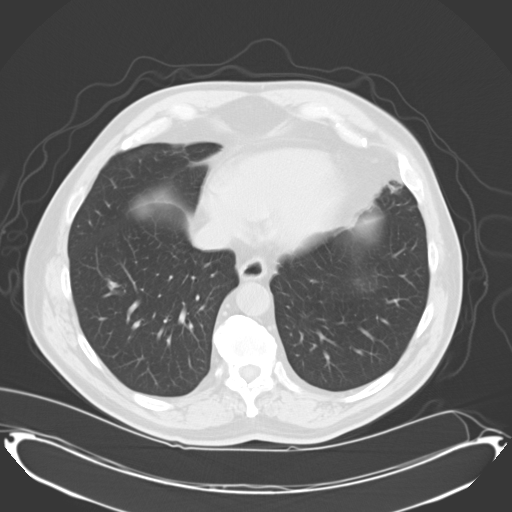
[im 46/49  bone]
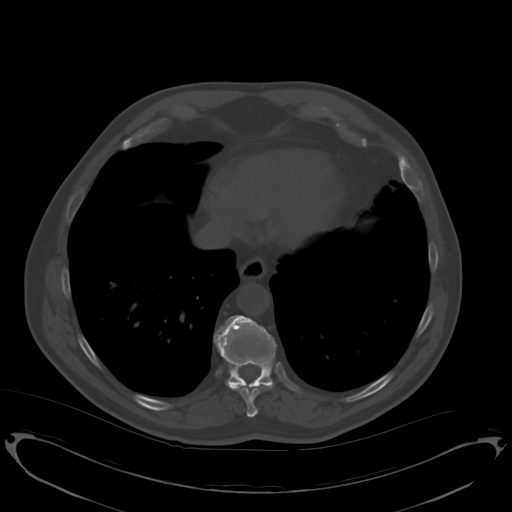

[Series 11: mpr cor (id) · coronal · 0.51mm/px · 3 of 104 slices shown]
[im 26/104  soft-tissue]
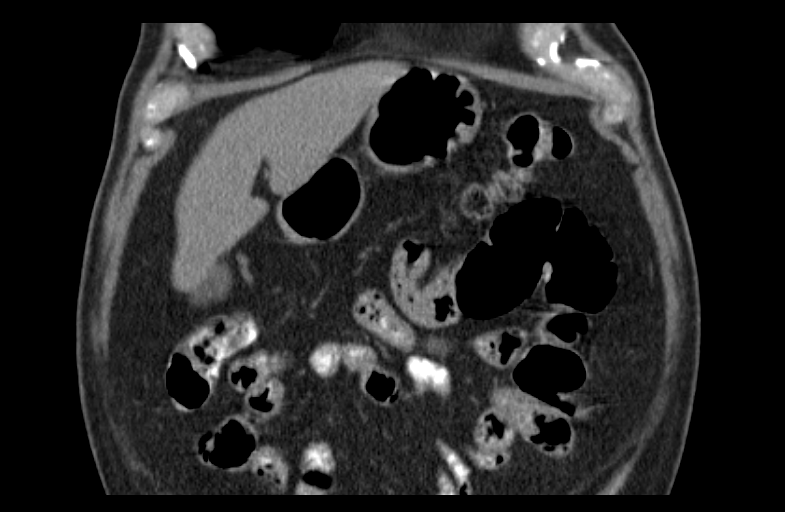
[im 52/104  soft-tissue]
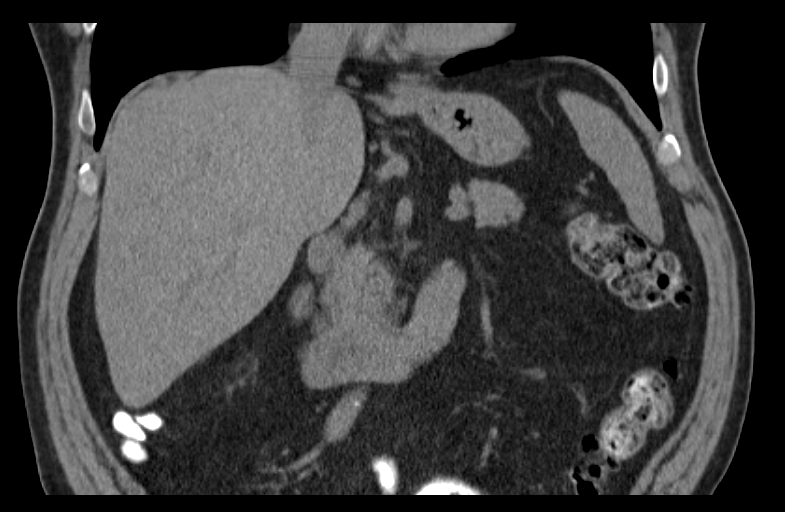
[im 78/104  soft-tissue]
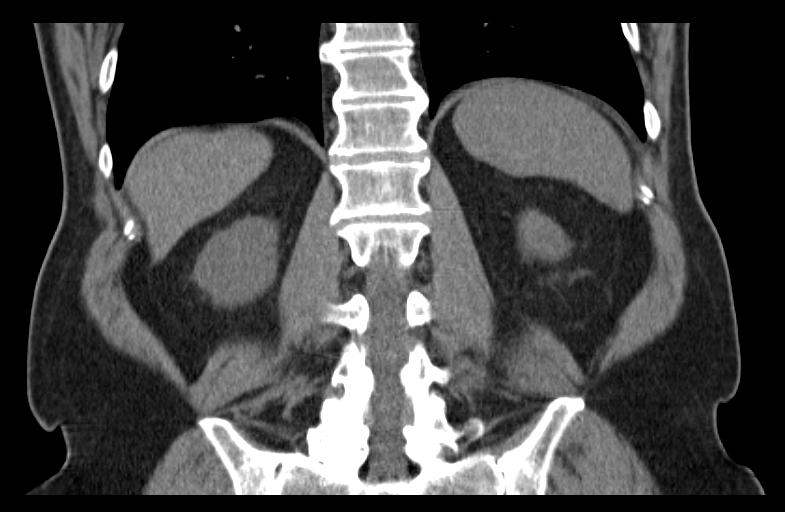

[14 of 46 positions shown; findings below may reference images not displayed]

FINDINGS: There is asymmetric scarring in the soft tissues around
the umbilicus to the right of midline which causes the soft tissue
prominence.  There is normal slight protrusion of the perineal fat
at the level of the umbilicus, unchanged since the prior
preoperative exam.

The liver, spleen, pancreas, adrenal glands, and kidneys are
normal.  Bowel is normal except for diverticulosis in the
descending colon.  Fairly severe degenerative facet arthritis is
present in the lower lumbar spine.
IMPRESSION: Inflammatory or scarring changes in the periumbilical fat just to
the right of the umbilicus.  No evidence of incarcerated hernia.

## 2013-06-13 ENCOUNTER — Other Ambulatory Visit (INDEPENDENT_AMBULATORY_CARE_PROVIDER_SITE_OTHER): Payer: Self-pay | Admitting: *Deleted

## 2013-06-13 ENCOUNTER — Telehealth (INDEPENDENT_AMBULATORY_CARE_PROVIDER_SITE_OTHER): Payer: Self-pay | Admitting: *Deleted

## 2013-06-13 DIAGNOSIS — Z1211 Encounter for screening for malignant neoplasm of colon: Secondary | ICD-10-CM

## 2013-06-13 DIAGNOSIS — Z800 Family history of malignant neoplasm of digestive organs: Secondary | ICD-10-CM

## 2013-06-13 DIAGNOSIS — Z86010 Personal history of colonic polyps: Secondary | ICD-10-CM

## 2013-06-13 NOTE — Telephone Encounter (Signed)
Patient needs movi prep 

## 2013-06-16 MED ORDER — PEG-KCL-NACL-NASULF-NA ASC-C 100 G PO SOLR: 1.0000 | Freq: Once | ORAL | Status: DC

## 2013-07-15 DIAGNOSIS — C494 Malignant neoplasm of connective and soft tissue of abdomen: Secondary | ICD-10-CM | POA: Diagnosis not present

## 2013-07-15 DIAGNOSIS — Z483 Aftercare following surgery for neoplasm: Secondary | ICD-10-CM | POA: Diagnosis not present

## 2013-07-15 DIAGNOSIS — R1900 Intra-abdominal and pelvic swelling, mass and lump, unspecified site: Secondary | ICD-10-CM | POA: Diagnosis not present

## 2013-07-15 DIAGNOSIS — Z8509 Personal history of malignant neoplasm of other digestive organs: Secondary | ICD-10-CM | POA: Diagnosis not present

## 2013-07-29 ENCOUNTER — Telehealth (INDEPENDENT_AMBULATORY_CARE_PROVIDER_SITE_OTHER): Payer: Self-pay | Admitting: *Deleted

## 2013-07-29 NOTE — Telephone Encounter (Signed)
  Procedure: tcs  Reason/Indication:  Hx polyps, fam hx colon ca  Has patient had this procedure before?  Yes, 2010 -- epic  If so, when, by whom and where?    Is there a family history of colon cancer?  Yes, brother  Who?  What age when diagnosed?    Is patient diabetic?   no      Does patient have prosthetic heart valve?  no  Do you have a pacemaker?  no  Has patient ever had endocarditis? no  Has patient had joint replacement within last 12 months?  no  Does patient tend to be constipated or take laxatives? no  Is patient on Coumadin, Plavix and/or Aspirin? no  Medications: omeprazole 20 mg daily, benazepril 10 mg daily, amlodipine 5 mg daily, lipitor 10 mg daily  Allergies: pcn  Medication Adjustment:   Procedure date & time: 08/21/13 at 930

## 2013-07-30 NOTE — Telephone Encounter (Signed)
agree

## 2013-08-07 ENCOUNTER — Encounter (HOSPITAL_COMMUNITY): Payer: Self-pay | Admitting: Pharmacy Technician

## 2013-08-21 ENCOUNTER — Encounter (HOSPITAL_COMMUNITY): Payer: Self-pay | Admitting: *Deleted

## 2013-08-21 ENCOUNTER — Ambulatory Visit (HOSPITAL_COMMUNITY)
Admission: RE | Admit: 2013-08-21 | Discharge: 2013-08-21 | Disposition: A | Discharge status: Home / Self Care | Payer: Medicare Other | Source: Ambulatory Visit | Attending: Internal Medicine | Admitting: Internal Medicine

## 2013-08-21 ENCOUNTER — Encounter (HOSPITAL_COMMUNITY)
Admission: RE | Disposition: A | Discharge status: Home / Self Care | Payer: Self-pay | Source: Ambulatory Visit | Attending: Internal Medicine

## 2013-08-21 DIAGNOSIS — E785 Hyperlipidemia, unspecified: Secondary | ICD-10-CM | POA: Insufficient documentation

## 2013-08-21 DIAGNOSIS — Z860100 Personal history of colon polyps, unspecified: Secondary | ICD-10-CM | POA: Insufficient documentation

## 2013-08-21 DIAGNOSIS — K5730 Diverticulosis of large intestine without perforation or abscess without bleeding: Secondary | ICD-10-CM | POA: Diagnosis not present

## 2013-08-21 DIAGNOSIS — I10 Essential (primary) hypertension: Secondary | ICD-10-CM | POA: Diagnosis not present

## 2013-08-21 DIAGNOSIS — D126 Benign neoplasm of colon, unspecified: Secondary | ICD-10-CM | POA: Diagnosis not present

## 2013-08-21 DIAGNOSIS — Z800 Family history of malignant neoplasm of digestive organs: Secondary | ICD-10-CM | POA: Insufficient documentation

## 2013-08-21 HISTORY — PX: COLONOSCOPY: SHX5424

## 2013-08-21 SURGERY — COLONOSCOPY
Anesthesia: Moderate Sedation

## 2013-08-21 MED ORDER — MEPERIDINE HCL 50 MG/ML IJ SOLN
INTRAMUSCULAR | Status: DC | PRN
  Administered 2013-08-21 (×2): 25 mg via INTRAVENOUS

## 2013-08-21 MED ORDER — MEPERIDINE HCL 50 MG/ML IJ SOLN
INTRAMUSCULAR | Status: AC
  Filled 2013-08-21: qty 1

## 2013-08-21 MED ORDER — SODIUM CHLORIDE 0.9 % IV SOLN
INTRAVENOUS | Status: DC
  Administered 2013-08-21: 09:00:00 via INTRAVENOUS

## 2013-08-21 MED ORDER — MIDAZOLAM HCL 5 MG/5ML IJ SOLN
INTRAMUSCULAR | Status: AC
  Filled 2013-08-21: qty 10

## 2013-08-21 MED ORDER — MIDAZOLAM HCL 5 MG/5ML IJ SOLN
INTRAMUSCULAR | Status: DC | PRN
  Administered 2013-08-21 (×3): 2 mg via INTRAVENOUS

## 2013-08-21 NOTE — Op Note (Signed)
COLONOSCOPY PROCEDURE REPORT  PATIENT:  David Weiss  MR#:  [id number] Birthdate:  06-14-1946, 68 y.o., male Endoscopist:  Dr. Rogene Houston, MD Referred By:  Dr. Asencion Noble, MD  Procedure Date: 08/21/2013  Procedure:   Colonoscopy with snare polypectomy.  Indications:  Patient is 68 year old Caucasian male with history of colonic adenomas and family history of colon carcinoma in a brother.  Informed Consent:  The procedure and risks were reviewed with the patient and informed consent was obtained.  Medications:  Demerol 50 mg IV Versed 6 mg IV  Description of procedure:  After a digital rectal exam was performed, that colonoscope was advanced from the anus through the rectum and colon to the area of the cecum, ileocecal valve and appendiceal orifice. The cecum was deeply intubated. These structures were well-seen and photographed for the record. From the level of the cecum and ileocecal valve, the scope was slowly and cautiously withdrawn. The mucosal surfaces were carefully surveyed utilizing scope tip to flexion to facilitate fold flattening as needed. The scope was pulled down into the rectum where a thorough exam including retroflexion was performed.  Findings:   Prep excellent. 6 mm polyp snared from splenic flexure. Multiple diverticula at sigmoid colon and a few more and descending colon. Normal rectal mucosa and anal rectal junction.    Therapeutic/Diagnostic Maneuvers Performed:  See above  Complications:  None  Cecal Withdrawal Time:  17 minutes  Impression:  Examination performed to cecum. Left-sided diverticulosis. 6 mm polyp snared from splenic flexure.  Recommendations:  Standard instructions given. I will contact patient with biopsy results and further recommendations.  REHMAN,NAJEEB U  08/21/2013 10:01 AM  CC: Dr. Asencion Noble, MD & Dr. Rayne Du ref. provider found

## 2013-08-21 NOTE — Discharge Instructions (Signed)
Resume usual medications and high fiber diet. No aspirin or NSAIDs for one week. No driving for 24 hours. Physician will contact you with biopsy results  Colon Polyps Polyps are lumps of extra tissue growing inside the body. Polyps can grow in the large intestine (colon). Most colon polyps are noncancerous (benign). However, some colon polyps can become cancerous over time. Polyps that are larger than a pea may be harmful. To be safe, caregivers remove and test all polyps. CAUSES  Polyps form when mutations in the genes cause your cells to grow and divide even though no more tissue is needed. RISK FACTORS There are a number of risk factors that can increase your chances of getting colon polyps. They include:  Being older than 50 years.  Family history of colon polyps or colon cancer.  Long-term colon diseases, such as colitis or Crohn disease.  Being overweight.  Smoking.  Being inactive.  Drinking too much alcohol. SYMPTOMS  Most small polyps do not cause symptoms. If symptoms are present, they may include:  Blood in the stool. The stool may look dark red or black.  Constipation or diarrhea that lasts longer than 1 week. DIAGNOSIS People often do not know they have polyps until their caregiver finds them during a regular checkup. Your caregiver can use 4 tests to check for polyps:  Digital rectal exam. The caregiver wears gloves and feels inside the rectum. This test would find polyps only in the rectum.  Barium enema. The caregiver puts a liquid called barium into your rectum before taking X-rays of your colon. Barium makes your colon look white. Polyps are dark, so they are easy to see in the X-ray pictures.  Sigmoidoscopy. A thin, flexible tube (sigmoidoscope) is placed into your rectum. The sigmoidoscope has a light and tiny camera in it. The caregiver uses the sigmoidoscope to look at the last third of your colon.  Colonoscopy. This test is like sigmoidoscopy, but the  caregiver looks at the entire colon. This is the most common method for finding and removing polyps. TREATMENT  Any polyps will be removed during a sigmoidoscopy or colonoscopy. The polyps are then tested for cancer. PREVENTION  To help lower your risk of getting more colon polyps:  Eat plenty of fruits and vegetables. Avoid eating fatty foods.  Do not smoke.  Avoid drinking alcohol.  Exercise every day.  Lose weight if recommended by your caregiver.  Eat plenty of calcium and folate. Foods that are rich in calcium include milk, cheese, and broccoli. Foods that are rich in folate include chickpeas, kidney beans, and spinach. HOME CARE INSTRUCTIONS Keep all follow-up appointments as directed by your caregiver. You may need periodic exams to check for polyps. SEEK MEDICAL CARE IF: You notice bleeding during a bowel movement. Document Released: 04/12/2004 Document Revised: 10/09/2011 Document Reviewed: 09/26/2011 [facility] Patient Information 2014 [place]. Colonoscopy, Care After Refer to this sheet in the next few weeks. These instructions provide you with information on caring for yourself after your procedure. Your health care provider may also give you more specific instructions. Your treatment has been planned according to current medical practices, but problems sometimes occur. Call your health care provider if you have any problems or questions after your procedure. WHAT TO EXPECT AFTER THE PROCEDURE  After your procedure, it is typical to have the following:  A small amount of blood in your stool.  Moderate amounts of gas and mild abdominal cramping or bloating. HOME CARE INSTRUCTIONS  Do not drive,  operate machinery, or sign important documents for 24 hours.  You may shower and resume your regular physical activities, but move at a slower pace for the first 24 hours.  Take frequent rest periods for the first 24 hours.  Walk around or put a warm pack on your abdomen  to help reduce abdominal cramping and bloating.  Drink enough fluids to keep your urine clear or pale yellow.  You may resume your normal diet as instructed by your health care provider. Avoid heavy or fried foods that are hard to digest.  Avoid drinking alcohol for 24 hours or as instructed by your health care provider.  Only take over-the-counter or prescription medicines as directed by your health care provider.  If a tissue sample (biopsy) was taken during your procedure:  Do not take aspirin or blood thinners for 7 days, or as instructed by your health care provider.  Do not drink alcohol for 7 days, or as instructed by your health care provider.  Eat soft foods for the first 24 hours. SEEK MEDICAL CARE IF: You have persistent spotting of blood in your stool 2 3 days after the procedure. SEEK IMMEDIATE MEDICAL CARE IF:  You have more than a small spotting of blood in your stool.  You pass large blood clots in your stool.  Your abdomen is swollen (distended).  You have nausea or vomiting.  You have a fever.  You have increasing abdominal pain that is not relieved with medicine. Document Released: 02/29/2004 Document Revised: 05/07/2013 Document Reviewed: 03/24/2013 [facility] Patient Information 2014 [place].

## 2013-08-21 NOTE — H&P (Signed)
David Weiss is an 68 y.o. male.   Chief Complaint: Patient is here for colonoscopy. HPI: Patient is 68 year old Caucasian male who has history of colonic adenomas and family history of colon carcinoma in his brother was 58 and the time of diagnosis who is here for surveillance colonoscopy. He denies abdominal pain change in his bowel habits or rectal bleeding. 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  Past Medical History  Diagnosis Date  . Barrett's esophagus 5/11    EGD Dr. Phillips Climes Barrett's, multiple antral/bulbar erosins, D2 erosion sec NSAIDs  . GERD (gastroesophageal reflux disease)   . HTN (hypertension)   . Hyperlipemia   . Helicobacter pylori gastritis 5/11  . Diverticulosis 5/11    Colonoscopy Dr Gala Romney  . Hx of adenomatous colonic polyps 8/10    Colonoscopy Dr. Gala Romney  . Family hx of colon cancer   . Schatzki's ring 5/11    50F dilator  . Anemia     Past Surgical History  Procedure Laterality Date  . Capsule endoscopy of the small bowel  02/17/2011       . Esophagogastroduodenoscopy  02/13/2011    Procedure: ESOPHAGOGASTRODUODENOSCOPY (EGD);  Surgeon: Daneil Dolin, MD;  [facility]: [facility];  Service: Endoscopy;  Laterality: N/A;  . Givens capsule study  02/15/2011    Procedure: GIVENS CAPSULE STUDY;  Surgeon: Daneil Dolin, MD;  Location: [facility];  Service: Endoscopy;  Laterality: N/A;  . Vasectomy    . Colonoscopy  05/19/2011    Procedure: COLONOSCOPY;  Surgeon: Daneil Dolin, MD;  Location: [facility];  Service: Endoscopy;  Laterality: N/A;  8:30  . Small intestine partial removal  06/2011    due to malignant tumor  . Excisional hernia  01/2013    Family History  Problem Relation Age of Onset  . Prostate cancer Father 65  . Diabetes Mother   . Heart failure Mother   . Colon cancer Brother 63   Social History:  reports that he quit smoking about 32 years ago. His  smoking use included Cigarettes. He has a 45 pack-year smoking history. He does not have any smokeless tobacco history on file. He reports that he drinks alcohol. He reports that he does not use illicit drugs.  Allergies:  Allergies  Allergen Reactions  . Penicillins Itching    Mother told him in his childhood  . Pantoprazole Sodium     REACTION: rash, itching    Medications Prior to Admission  Medication Sig Dispense Refill  . amLODipine (NORVASC) 5 MG tablet Take 5 mg by mouth daily.        Marland Kitchen atorvastatin (LIPITOR) 10 MG tablet Take 10 mg by mouth daily.        . benazepril (LOTENSIN) 10 MG tablet Take 10 mg by mouth daily.        . halobetasol (ULTRAVATE) 0.05 % cream Apply 1 application topically every other day as needed. Apply to affected areas as needed for flareups       . hydroxypropyl methylcellulose (ISOPTO TEARS) 2.5 % ophthalmic solution Place 1-2 drops into both eyes 2 (two) times daily as needed. Dry eyes/allergies       . loratadine (CLARITIN) 10 MG tablet Take 10 mg by mouth daily as needed.       Marland Kitchen omeprazole (PRILOSEC) 20 MG capsule Take 1 capsule (20 mg total) by mouth 1 day or 1 dose.      Marland Kitchen  peg 3350 powder (MOVIPREP) 100 G SOLR Take 1 kit (200 g total) by mouth once.  1 kit  0    No results found for this or any previous visit (from the past 48 hour(s)). No results found.  ROS  Blood pressure 155/75, pulse 66, temperature 97.8 F (36.6 C), temperature source Oral, resp. rate 22, height '5\' 9"'  (1.753 m), weight 186 lb (84.369 kg), SpO2 97.00%. Physical Exam  Constitutional: He appears well-developed and well-nourished.  HENT:  Mouth/Throat: Oropharynx is clear and moist.  Eyes: Conjunctivae are normal. No scleral icterus.  Neck: No thyromegaly present.  Cardiovascular: Normal rate, regular rhythm and normal heart sounds.   No murmur heard. Respiratory: Effort normal and breath sounds normal.  GI: Soft.  Upper midline scar  Musculoskeletal: He exhibits no  edema.  Lymphadenopathy:    He has no cervical adenopathy.  Neurological: He is alert.  Skin: Skin is warm and dry.     Assessment/Plan History of colonic adenomas and family history of colon carcinoma. Surveillance colonoscopy.  David Weiss 08/21/2013, 9:20 AM

## 2013-08-25 ENCOUNTER — Encounter (INDEPENDENT_AMBULATORY_CARE_PROVIDER_SITE_OTHER): Payer: Self-pay | Admitting: *Deleted

## 2013-08-25 DIAGNOSIS — J019 Acute sinusitis, unspecified: Secondary | ICD-10-CM | POA: Diagnosis not present

## 2013-08-26 ENCOUNTER — Encounter (HOSPITAL_COMMUNITY): Payer: Self-pay | Admitting: Internal Medicine

## 2013-09-23 DIAGNOSIS — H2510 Age-related nuclear cataract, unspecified eye: Secondary | ICD-10-CM | POA: Diagnosis not present

## 2014-01-13 DIAGNOSIS — C494 Malignant neoplasm of connective and soft tissue of abdomen: Secondary | ICD-10-CM | POA: Diagnosis not present

## 2014-01-13 DIAGNOSIS — Z483 Aftercare following surgery for neoplasm: Secondary | ICD-10-CM | POA: Diagnosis not present

## 2014-01-13 DIAGNOSIS — Z8719 Personal history of other diseases of the digestive system: Secondary | ICD-10-CM | POA: Diagnosis not present

## 2014-01-13 DIAGNOSIS — R1900 Intra-abdominal and pelvic swelling, mass and lump, unspecified site: Secondary | ICD-10-CM | POA: Diagnosis not present

## 2014-01-26 DIAGNOSIS — I700 Atherosclerosis of aorta: Secondary | ICD-10-CM | POA: Diagnosis not present

## 2014-01-26 DIAGNOSIS — I10 Essential (primary) hypertension: Secondary | ICD-10-CM | POA: Diagnosis not present

## 2014-01-26 DIAGNOSIS — E785 Hyperlipidemia, unspecified: Secondary | ICD-10-CM | POA: Diagnosis not present

## 2014-02-10 DIAGNOSIS — L723 Sebaceous cyst: Secondary | ICD-10-CM | POA: Diagnosis not present

## 2014-02-23 NOTE — H&P (Signed)
  NTS SOAP Note  Vital Signs:  Vitals as of: 11/06/8117: Systolic 147: Diastolic 71: Heart Rate 65: Temp 98.34F: Height 62ft 9in: Weight 192Lbs 0 Ounces: Pain Level 4: BMI 28.35  BMI : 28.35 kg/m2  Subjective: This 68 Years  old Male presents for of a sebaceous cyst on his neck.  Has been present for some time, but is increasing in size.  Pain to touch.  Pain radiates in neck with movement.  Review of Symptoms:  Constitutional:unremarkable   Head:unremarkable    Eyes:unremarkable   Nose/Mouth/Throat:unremarkable Cardiovascular:  unremarkable   Respiratory:unremarkable   Gastrointestinal:  unremarkable   Genitourinary:unremarkable       neck pain Hematolgic/Lymphatic:unremarkable     Allergic/Immunologic:unremarkable     Past Medical History:    Reviewed  Past Medical History  Surgical History: malignant GIST 2012 Medical Problems: HTN, reflux, high cholesterol Allergies: pcn Medications: omeprazole, benazapril, amlodipine, atorvastatin, sertraline   Social History:Reviewed  Social History  Preferred Language: English Race:  White Ethnicity: Not Hispanic / Latino Age: 30 Years 3 Months Marital Status:  M Alcohol: yes   Smoking Status: Never smoker reviewed on 02/10/2014 Functional Status reviewed on 02/10/2014 ------------------------------------------------ Bathing: Normal Cooking: Normal Dressing: Normal Driving: Normal Eating: Normal Managing Meds: Normal Oral Care: Normal Shopping: Normal Toileting: Normal Transferring: Normal Walking: Normal Cognitive Status reviewed on 02/10/2014 ------------------------------------------------ Attention: Normal Decision Making: Normal Language: Normal Memory: Normal Motor: Normal Perception: Normal Problem Solving: Normal Visual and Spatial: Normal   Family History:  Reviewed  Family Health History Mother, Unknown; Diabetes mellitus, unspecified type;  Father,  Deceased; Prostate cancer;     Objective Information: General:  Well appearing, well nourished in no distress.   Tender 1.5cm cystic lesion, subcutaneous, posterior neck. Heart:  RRR, no murmur Lungs:    CTA bilaterally, no wheezes, rhonchi, rales.  Breathing unlabored.  Assessment:Sebaceous cyst, neck  Diagnoses: 706.2 Epidermoid cyst of skin (Sebaceous cyst)  Procedures: 82956 - OFFICE OUTPATIENT NEW 20 MINUTES    Plan:  Scheduled for excision of sebaceous cyst, neck on 03/25/14.   Patient Education:Alternative treatments to surgery were discussed with patient (and family).  Risks and benefits  of procedure were fully explained to the patient (and family) who gave informed consent. Patient/family questions were addressed.  Follow-up:Pending Surgery

## 2014-03-02 ENCOUNTER — Encounter (HOSPITAL_COMMUNITY): Payer: Self-pay | Admitting: Pharmacy Technician

## 2014-03-16 DIAGNOSIS — Z23 Encounter for immunization: Secondary | ICD-10-CM | POA: Diagnosis not present

## 2014-03-16 DIAGNOSIS — R450 Nervousness: Secondary | ICD-10-CM | POA: Diagnosis not present

## 2014-03-17 NOTE — Patient Instructions (Signed)
David Weiss  03/17/2014   Your procedure is scheduled on:   03/25/2014  Report to [facility] at  26  AM.  Call this number if you have problems the morning of surgery: [phone]   Remember:   Do not eat food or drink liquids after midnight.   Take these medicines the morning of surgery with A SIP OF WATER: amlodipine, benazepril, zoloft   Do not wear jewelry, make-up or nail polish.  Do not wear lotions, powders, or perfumes.  Do not shave 48 hours prior to surgery. Men may shave face and neck.  Do not bring valuables to the hospital.  [facility] is not responsible for any belongings or valuables.               Contacts, dentures or bridgework may not be worn into surgery.  Leave suitcase in the car. After surgery it may be brought to your room.  For patients admitted to the hospital, discharge time is determined by your treatment team.               Patients discharged the day of surgery will not be allowed to drive home.  Name and phone number of your driver: family  Special Instructions: Shower using CHG 2 nights before surgery and the night before surgery.  If you shower the day of surgery use CHG.  Use special wash - you have one bottle of CHG for all showers.  You should use approximately 1/3 of the bottle for each shower.   Please read over the following fact sheets that you were given: Pain Booklet, Coughing and Deep Breathing, Surgical Site Infection Prevention, Anesthesia Post-op Instructions and Care and Recovery After Surgery Cyst Removal Your caregiver has removed a cyst. A cyst is a sac containing a semi-solid material. Cysts may occur any place on your body. They may remain small for years or gradually get larger. A sebaceous cyst is an enlarged (dilated) sweat gland filled with old sweat (sebum). Unattended, these may become large (the size of a softball) over several years time. These are often removed for improved appearance (cosmetic) reasons or before  they become infected to form an abscess. An abscess is an infected cyst. HOME CARE INSTRUCTIONS   Keep your bandage clean and dry. You may change your bandage after 24 hours. If your bandage sticks, use warm water to gently loosen it. Pat the area dry with a clean towel before putting on another bandage.  If possible, keep the area where the cyst was removed raised to relieve soreness, swelling, and promote healing.  If you have stitches, keep them clean and dry.  You may clean your stitches gently with a cotton swab dipped in warm soapy water.  Do not soak the area where the cyst was removed or go swimming. You may shower.  Do not overuse the area where your cyst was removed.  Return in 7 days or as directed to have your stitches removed.  Take medicines as instructed by your caregiver. SEEK IMMEDIATE MEDICAL CARE IF:   An oral temperature above 102 F (38.9 C) develops, not controlled by medication.  Blood continues to soak through the bandage.  You have increasing pain in the area where your cyst was removed.  You have redness, swelling, pus, a bad smell, soreness (inflammation), or red streaks coming away from the stitches. These are signs of infection. MAKE SURE YOU:   Understand these instructions.  Will  watch your condition.  Will get help right away if you are not doing well or get worse. Document Released: 07/14/2000 Document Revised: 10/09/2011 Document Reviewed: 11/07/2007 [facility] Patient Information 2015 [place], [place]. This information is not intended to replace advice given to you by your health care provider. Make sure you discuss any questions you have with your health care provider. PATIENT INSTRUCTIONS POST-ANESTHESIA  IMMEDIATELY FOLLOWING SURGERY:  Do not drive or operate machinery for the first twenty four hours after surgery.  Do not make any important decisions for twenty four hours after surgery or while taking narcotic pain medications or sedatives.   If you develop intractable nausea and vomiting or a severe headache please notify your doctor immediately.  FOLLOW-UP:  Please make an appointment with your surgeon as instructed. You do not need to follow up with anesthesia unless specifically instructed to do so.  WOUND CARE INSTRUCTIONS (if applicable):  Keep a dry clean dressing on the anesthesia/puncture wound site if there is drainage.  Once the wound has quit draining you may leave it open to air.  Generally you should leave the bandage intact for twenty four hours unless there is drainage.  If the epidural site drains for more than 36-48 hours please call the anesthesia department.  QUESTIONS?:  Please feel free to call your physician or the hospital operator if you have any questions, and they will be happy to assist you.

## 2014-03-18 ENCOUNTER — Encounter (HOSPITAL_COMMUNITY)
Admission: RE | Admit: 2014-03-18 | Discharge: 2014-03-18 | Disposition: A | Discharge status: Home / Self Care | Payer: Medicare Other | Source: Ambulatory Visit | Attending: General Surgery | Admitting: General Surgery

## 2014-03-18 ENCOUNTER — Encounter (HOSPITAL_COMMUNITY): Payer: Self-pay

## 2014-03-18 DIAGNOSIS — L723 Sebaceous cyst: Secondary | ICD-10-CM | POA: Insufficient documentation

## 2014-03-18 DIAGNOSIS — Z01812 Encounter for preprocedural laboratory examination: Secondary | ICD-10-CM | POA: Insufficient documentation

## 2014-03-18 DIAGNOSIS — Z01818 Encounter for other preprocedural examination: Secondary | ICD-10-CM | POA: Diagnosis not present

## 2014-03-18 LAB — BASIC METABOLIC PANEL
ANION GAP: 11 (ref 5–15)
BUN: 14 mg/dL (ref 6–23)
CALCIUM: 9.5 mg/dL (ref 8.4–10.5)
CO2: 26 meq/L (ref 19–32)
CREATININE: 0.87 mg/dL (ref 0.50–1.35)
Chloride: 105 mEq/L (ref 96–112)
GFR, EST NON AFRICAN AMERICAN: 85 mL/min — AB (ref >90)
Glucose, Bld: 130 mg/dL — ABNORMAL HIGH (ref 70–99)
Potassium: 3.9 mEq/L (ref 3.7–5.3)
SODIUM: 142 meq/L (ref 137–147)

## 2014-03-18 LAB — CBC WITH DIFFERENTIAL/PLATELET
BASOS PCT: 1 % (ref 0–1)
Basophils Absolute: 0 10*3/uL (ref 0.0–0.1)
Eosinophils Absolute: 0.1 10*3/uL (ref 0.0–0.7)
Eosinophils Relative: 3 % (ref 0–5)
HEMATOCRIT: 43.1 % (ref 39.0–52.0)
Hemoglobin: 14.8 g/dL (ref 13.0–17.0)
LYMPHS PCT: 27 % (ref 12–46)
Lymphs Abs: 1.4 10*3/uL (ref 0.7–4.0)
MCH: 30.5 pg (ref 26.0–34.0)
MCHC: 34.3 g/dL (ref 30.0–36.0)
MCV: 88.7 fL (ref 78.0–100.0)
Monocytes Absolute: 0.4 10*3/uL (ref 0.1–1.0)
Monocytes Relative: 8 % (ref 3–12)
NEUTROS ABS: 3.1 10*3/uL (ref 1.7–7.7)
NEUTROS PCT: 61 % (ref 43–77)
PLATELETS: 177 10*3/uL (ref 150–400)
RBC: 4.86 MIL/uL (ref 4.22–5.81)
RDW: 12.9 % (ref 11.5–15.5)
WBC: 5 10*3/uL (ref 4.0–10.5)

## 2014-03-25 ENCOUNTER — Ambulatory Visit (HOSPITAL_COMMUNITY)
Admission: RE | Admit: 2014-03-25 | Discharge: 2014-03-25 | Disposition: A | Discharge status: Home / Self Care | Payer: Medicare Other | Source: Ambulatory Visit | Attending: General Surgery | Admitting: General Surgery

## 2014-03-25 ENCOUNTER — Encounter (HOSPITAL_COMMUNITY)
Admission: RE | Disposition: A | Discharge status: Home / Self Care | Payer: Self-pay | Source: Ambulatory Visit | Attending: General Surgery

## 2014-03-25 ENCOUNTER — Encounter (HOSPITAL_COMMUNITY): Payer: Medicare Other | Admitting: Anesthesiology

## 2014-03-25 ENCOUNTER — Encounter (HOSPITAL_COMMUNITY): Payer: Self-pay | Admitting: *Deleted

## 2014-03-25 ENCOUNTER — Ambulatory Visit (HOSPITAL_COMMUNITY): Payer: Medicare Other | Admitting: Anesthesiology

## 2014-03-25 DIAGNOSIS — Z79899 Other long term (current) drug therapy: Secondary | ICD-10-CM | POA: Insufficient documentation

## 2014-03-25 DIAGNOSIS — E7800 Pure hypercholesterolemia, unspecified: Secondary | ICD-10-CM | POA: Diagnosis not present

## 2014-03-25 DIAGNOSIS — K219 Gastro-esophageal reflux disease without esophagitis: Secondary | ICD-10-CM | POA: Diagnosis not present

## 2014-03-25 DIAGNOSIS — D234 Other benign neoplasm of skin of scalp and neck: Secondary | ICD-10-CM | POA: Insufficient documentation

## 2014-03-25 DIAGNOSIS — D4989 Neoplasm of unspecified behavior of other specified sites: Secondary | ICD-10-CM | POA: Diagnosis not present

## 2014-03-25 DIAGNOSIS — I10 Essential (primary) hypertension: Secondary | ICD-10-CM | POA: Insufficient documentation

## 2014-03-25 DIAGNOSIS — L723 Sebaceous cyst: Secondary | ICD-10-CM | POA: Diagnosis not present

## 2014-03-25 HISTORY — PX: EAR CYST EXCISION: SHX22

## 2014-03-25 SURGERY — CYST REMOVAL
Anesthesia: General | Site: Neck

## 2014-03-25 MED ORDER — VANCOMYCIN HCL IN DEXTROSE 1-5 GM/200ML-% IV SOLN
1000.0000 mg | INTRAVENOUS | Status: AC
  Administered 2014-03-25: 1000 mg via INTRAVENOUS

## 2014-03-25 MED ORDER — CHLORHEXIDINE GLUCONATE 4 % EX LIQD: 1.0000 "application " | Freq: Once | CUTANEOUS | Status: DC

## 2014-03-25 MED ORDER — FENTANYL CITRATE 0.05 MG/ML IJ SOLN
INTRAMUSCULAR | Status: AC
  Filled 2014-03-25: qty 5

## 2014-03-25 MED ORDER — LIDOCAINE HCL (PF) 1 % IJ SOLN
INTRAMUSCULAR | Status: AC
  Filled 2014-03-25: qty 5

## 2014-03-25 MED ORDER — BUPIVACAINE HCL (PF) 0.5 % IJ SOLN
INTRAMUSCULAR | Status: AC
  Filled 2014-03-25: qty 30

## 2014-03-25 MED ORDER — SUCCINYLCHOLINE CHLORIDE 20 MG/ML IJ SOLN
INTRAMUSCULAR | Status: AC
  Filled 2014-03-25: qty 1

## 2014-03-25 MED ORDER — KETOROLAC TROMETHAMINE 30 MG/ML IJ SOLN
30.0000 mg | Freq: Once | INTRAMUSCULAR | Status: AC
  Administered 2014-03-25: 30 mg via INTRAVENOUS
  Filled 2014-03-25: qty 1

## 2014-03-25 MED ORDER — MIDAZOLAM HCL 2 MG/2ML IJ SOLN
1.0000 mg | INTRAMUSCULAR | Status: DC | PRN
  Administered 2014-03-25 (×2): 2 mg via INTRAVENOUS
  Filled 2014-03-25: qty 2

## 2014-03-25 MED ORDER — FENTANYL CITRATE 0.05 MG/ML IJ SOLN: 25.0000 ug | INTRAMUSCULAR | Status: DC | PRN

## 2014-03-25 MED ORDER — GLYCOPYRROLATE 0.2 MG/ML IJ SOLN
INTRAMUSCULAR | Status: DC | PRN
  Administered 2014-03-25: 0.2 mg via INTRAVENOUS

## 2014-03-25 MED ORDER — MIDAZOLAM HCL 2 MG/2ML IJ SOLN
INTRAMUSCULAR | Status: AC
  Filled 2014-03-25: qty 2

## 2014-03-25 MED ORDER — PROPOFOL 10 MG/ML IV BOLUS
INTRAVENOUS | Status: AC
  Filled 2014-03-25: qty 20

## 2014-03-25 MED ORDER — TRAMADOL HCL 50 MG PO TABS: 50.0000 mg | ORAL_TABLET | Freq: Four times a day (QID) | ORAL | Status: DC | PRN

## 2014-03-25 MED ORDER — VANCOMYCIN HCL IN DEXTROSE 1-5 GM/200ML-% IV SOLN
INTRAVENOUS | Status: AC
  Filled 2014-03-25: qty 200

## 2014-03-25 MED ORDER — LIDOCAINE HCL (CARDIAC) 20 MG/ML IV SOLN
INTRAVENOUS | Status: DC | PRN
  Administered 2014-03-25: 50 mg via INTRAVENOUS

## 2014-03-25 MED ORDER — ONDANSETRON HCL 4 MG/2ML IJ SOLN
4.0000 mg | Freq: Once | INTRAMUSCULAR | Status: AC
  Administered 2014-03-25: 4 mg via INTRAVENOUS

## 2014-03-25 MED ORDER — LACTATED RINGERS IV SOLN
INTRAVENOUS | Status: DC
  Administered 2014-03-25: 09:00:00 via INTRAVENOUS

## 2014-03-25 MED ORDER — SODIUM CHLORIDE 0.9 % IR SOLN
Status: DC | PRN
  Administered 2014-03-25: 1000 mL

## 2014-03-25 MED ORDER — PROPOFOL 10 MG/ML IV BOLUS
INTRAVENOUS | Status: DC | PRN
  Administered 2014-03-25: 150 mg via INTRAVENOUS

## 2014-03-25 MED ORDER — ONDANSETRON HCL 4 MG/2ML IJ SOLN: 4.0000 mg | Freq: Once | INTRAMUSCULAR | Status: DC | PRN

## 2014-03-25 MED ORDER — ONDANSETRON HCL 4 MG/2ML IJ SOLN
INTRAMUSCULAR | Status: AC
  Filled 2014-03-25: qty 2

## 2014-03-25 MED ORDER — EPHEDRINE SULFATE 50 MG/ML IJ SOLN
INTRAMUSCULAR | Status: AC
  Filled 2014-03-25: qty 1

## 2014-03-25 MED ORDER — EPHEDRINE SULFATE 50 MG/ML IJ SOLN
INTRAMUSCULAR | Status: DC | PRN
  Administered 2014-03-25 (×2): 10 mg via INTRAVENOUS

## 2014-03-25 MED ORDER — SUCCINYLCHOLINE CHLORIDE 20 MG/ML IJ SOLN
INTRAMUSCULAR | Status: DC | PRN
  Administered 2014-03-25: 120 mg via INTRAVENOUS

## 2014-03-25 MED ORDER — FENTANYL CITRATE 0.05 MG/ML IJ SOLN
INTRAMUSCULAR | Status: DC | PRN
  Administered 2014-03-25 (×5): 50 ug via INTRAVENOUS

## 2014-03-25 MED ORDER — BUPIVACAINE HCL 0.5 % IJ SOLN
INTRAMUSCULAR | Status: DC | PRN
  Administered 2014-03-25: 3 mL

## 2014-03-25 MED ORDER — SODIUM CHLORIDE 0.9 % IJ SOLN
INTRAMUSCULAR | Status: AC
  Filled 2014-03-25: qty 10

## 2014-03-25 MED ORDER — LACTATED RINGERS IV SOLN
INTRAVENOUS | Status: DC | PRN
  Administered 2014-03-25: 09:00:00 via INTRAVENOUS

## 2014-03-25 MED ORDER — POVIDONE-IODINE 10 % EX OINT
TOPICAL_OINTMENT | CUTANEOUS | Status: AC
  Filled 2014-03-25: qty 1

## 2014-03-25 SURGICAL SUPPLY — 27 items
ADH SKN CLS APL DERMABOND .7 (GAUZE/BANDAGES/DRESSINGS) ×1
BAG HAMPER (MISCELLANEOUS) ×3 IMPLANT
CLOTH BEACON ORANGE TIMEOUT ST (SAFETY) ×3 IMPLANT
COVER LIGHT HANDLE STERIS (MISCELLANEOUS) ×6 IMPLANT
DECANTER SPIKE VIAL GLASS SM (MISCELLANEOUS) ×3 IMPLANT
DERMABOND ADVANCED (GAUZE/BANDAGES/DRESSINGS) ×2
DERMABOND ADVANCED .7 DNX12 (GAUZE/BANDAGES/DRESSINGS) ×1 IMPLANT
DRAPE EENT ADH APERT 31X51 STR (DRAPES) ×3 IMPLANT
ELECT NEEDLE TIP 2.8 STRL (NEEDLE) IMPLANT
ELECT REM PT RETURN 9FT ADLT (ELECTROSURGICAL) ×3
ELECTRODE REM PT RTRN 9FT ADLT (ELECTROSURGICAL) ×1 IMPLANT
FORMALIN 10 PREFIL 120ML (MISCELLANEOUS) ×3 IMPLANT
GLOVE SURG SS PI 7.5 STRL IVOR (GLOVE) ×6 IMPLANT
GOWN STRL REUS W/TWL LRG LVL3 (GOWN DISPOSABLE) ×6 IMPLANT
KIT ROOM TURNOVER APOR (KITS) ×3 IMPLANT
MANIFOLD NEPTUNE II (INSTRUMENTS) ×3 IMPLANT
NEEDLE HYPO 25X1 1.5 SAFETY (NEEDLE) ×3 IMPLANT
NS IRRIG 1000ML POUR BTL (IV SOLUTION) ×3 IMPLANT
PACK MINOR (CUSTOM PROCEDURE TRAY) IMPLANT
PAD ARMBOARD 7.5X6 YLW CONV (MISCELLANEOUS) ×3 IMPLANT
SET BASIN LINEN APH (SET/KITS/TRAYS/PACK) ×3 IMPLANT
SUT ETHILON 3 0 FSL (SUTURE) IMPLANT
SUT PROLENE 4 0 PS 2 18 (SUTURE) IMPLANT
SUT VIC AB 3-0 SH 27 (SUTURE) ×3
SUT VIC AB 3-0 SH 27X BRD (SUTURE) ×1 IMPLANT
SUT VIC AB 4-0 PS2 27 (SUTURE) ×3 IMPLANT
SYRINGE CONTROL L 12CC (SYRINGE) ×3 IMPLANT

## 2014-03-25 NOTE — Anesthesia Postprocedure Evaluation (Signed)
  Anesthesia Post-op Note  Patient: David Weiss  Procedure(s) Performed: Procedure(s): EXCISION OF NEOPLASM NECK (N/A)  Patient Location: PACU  Anesthesia Type:General  Level of Consciousness: awake, alert , oriented and patient cooperative  Airway and Oxygen Therapy: Patient Spontanous Breathing and Patient connected to face mask oxygen  Post-op Pain: mild  Post-op Assessment: Post-op Vital signs reviewed, Patient's Cardiovascular Status Stable, Respiratory Function Stable, Patent Airway, No signs of Nausea or vomiting and Pain level controlled  Post-op Vital Signs: Reviewed and stable  Last Vitals:  Filed Vitals:   03/25/14 1000  BP:   Pulse:   Temp:   Resp: 37    Complications: No apparent anesthesia complications

## 2014-03-25 NOTE — Anesthesia Preprocedure Evaluation (Signed)
Anesthesia Evaluation  Patient identified by MRN, date of birth, ID band Patient awake    Reviewed: Allergy & Precautions, H&P , NPO status , Patient's Chart, lab work & pertinent test results  Airway Mallampati: II TM Distance: >3 FB     Dental  (+) Teeth Intact   Pulmonary former smoker,  breath sounds clear to auscultation        Cardiovascular hypertension, Pt. on medications Rhythm:Regular Rate:Normal     Neuro/Psych    GI/Hepatic GERD-  Medicated and Controlled,  Endo/Other    Renal/GU      Musculoskeletal   Abdominal   Peds  Hematology   Anesthesia Other Findings   Reproductive/Obstetrics                           Anesthesia Physical Anesthesia Plan  ASA: II  Anesthesia Plan: General   Post-op Pain Management:    Induction: Intravenous, Rapid sequence and Cricoid pressure planned  Airway Management Planned: Oral ETT  Additional Equipment:   Intra-op Plan:   Post-operative Plan: Extubation in OR  Informed Consent: I have reviewed the patients History and Physical, chart, labs and discussed the procedure including the risks, benefits and alternatives for the proposed anesthesia with the patient or authorized representative who has indicated his/her understanding and acceptance.     Plan Discussed with:   Anesthesia Plan Comments:         Anesthesia Quick Evaluation

## 2014-03-25 NOTE — Discharge Instructions (Signed)
Cyst Removal   Your caregiver has removed a cyst. A cyst is a sac containing a semi-solid material. Cysts may occur any place on your body. They may remain small for years or gradually get larger. A sebaceous cyst is an enlarged (dilated) sweat gland filled with old sweat (sebum). Unattended, these may become large (the size of a softball) over several years time. These are often removed for improved appearance (cosmetic) reasons or before they become infected to form an abscess. An abscess is an infected cyst.   HOME CARE INSTRUCTIONS   Keep your bandage clean and dry. You may change your bandage after 24 hours. If your bandage sticks, use warm water to gently loosen it. Pat the area dry with a clean towel before putting on another bandage.   If possible, keep the area where the cyst was removed raised to relieve soreness, swelling, and promote healing.   If you have stitches, keep them clean and dry.   You may clean your stitches gently with a cotton swab dipped in warm soapy water.   Do not soak the area where the cyst was removed or go swimming. You may shower.   Do not overuse the area where your cyst was removed.   Return in 7 days or as directed to have your stitches removed.   Take medicines as instructed by your caregiver.  SEEK IMMEDIATE MEDICAL CARE IF:   An oral temperature above 102° F (38.9° C) develops, not controlled by medication.   Blood continues to soak through the bandage.   You have increasing pain in the area where your cyst was removed.   You have redness, swelling, pus, a bad smell, soreness (inflammation), or red streaks coming away from the stitches. These are signs of infection.  MAKE SURE YOU:   Understand these instructions.   Will watch your condition.   Will get help right away if you are not doing well or get worse.  Document Released: 07/14/2000 Document Revised: 10/09/2011 Document Reviewed: 11/07/2007   [employer]® Patient Information ©2015 [employer]. This information is not  intended to replace advice given to you by your health care provider. Make sure you discuss any questions you have with your health care provider.

## 2014-03-25 NOTE — Interval H&P Note (Signed)
History and Physical Interval Note:  03/25/2014 9:46 AM  David Weiss  has presented today for surgery, with the diagnosis of sebaceous cyst on neck  The various methods of treatment have been discussed with the patient and family. After consideration of risks, benefits and other options for treatment, the patient has consented to  Procedure(s): EXCISION SEBACEOUS CYST NECK (N/A) as a surgical intervention .  The patient's history has been reviewed, patient examined, no change in status, stable for surgery.  I have reviewed the patient's chart and labs.  Questions were answered to the patient's satisfaction.     Aviva Signs A

## 2014-03-25 NOTE — Transfer of Care (Signed)
Immediate Anesthesia Transfer of Care Note  Patient: David Weiss  Procedure(s) Performed: Procedure(s): EXCISION OF NEOPLASM NECK (N/A)  Patient Location: PACU  Anesthesia Type:General  Level of Consciousness: awake, alert , oriented and patient cooperative  Airway & Oxygen Therapy: Patient Spontanous Breathing and Patient connected to face mask oxygen  Post-op Assessment: Report given to PACU RN and Post -op Vital signs reviewed and stable  Post vital signs: Reviewed and stable  Complications: No apparent anesthesia complications

## 2014-03-25 NOTE — Anesthesia Procedure Notes (Signed)
Procedure Name: Intubation Date/Time: 03/25/2014 10:11 AM Performed by: Andree Elk, Lanell Dubie A Pre-anesthesia Checklist: Patient identified, Patient being monitored, Timeout performed, Emergency Drugs available and Suction available Patient Re-evaluated:Patient Re-evaluated prior to inductionOxygen Delivery Method: Circle System Utilized Preoxygenation: Pre-oxygenation with 100% oxygen Intubation Type: IV induction, Rapid sequence and Cricoid Pressure applied Laryngoscope Size: 3 and Miller Grade View: Grade I Tube type: Oral Tube size: 7.0 mm Number of attempts: 1 Airway Equipment and Method: stylet Placement Confirmation: ETT inserted through vocal cords under direct vision,  positive ETCO2 and breath sounds checked- equal and bilateral Secured at: 21 cm Tube secured with: Tape Dental Injury: Teeth and Oropharynx as per pre-operative assessment

## 2014-03-25 NOTE — Op Note (Signed)
Patient:  David Weiss  DOB:  1946/02/07  MRN:  [id number]   Preop Diagnosis:  Cyst, neck  Postop Diagnosis:  Same  Procedure:  Excision of 1.5 cm benign soft tissue mass, neck  Surgeon:  Aviva Signs, M.D.  Anes:  General endotracheal  Indications:  Patient is a 68 year old white male who presents with a tender, enlarging mass in the posterior soft tissue of the neck. The risks and benefits of the procedure were fully explained to the patient, who gave informed consent.  Procedure note:  The patient was placed in the right lateral decubitus position after induction of general endotracheal anesthesia. The posterior neck was prepped and draped using usual sterile technique with DuraPrep. Surgical site confirmation was performed.  A transverse incision was made over the mass. Dissection was taken down to the soft tissue and a 1.5 cm ovoid, rubbery mass was found. This was excised without difficulty. Sent to pathology for further examination. Any bleeding was controlled using Bovie electrocautery. The subcutaneous layer was reapproximated using 3-0 Vicryl interrupted suture. The skin was closed using a 4-0 Vicryl subcuticular suture. 0.5% Sensorcaine was instilled the surrounding wound. Dermabond was then applied.  All tape and needle counts were correct at the end of the procedure. Patient was extubated in the operating room and transferred to PACU in stable condition.  Complications:  None  EBL:  Minimal  Specimen:  Soft tissue mass/cyst, neck

## 2014-03-26 ENCOUNTER — Encounter (HOSPITAL_COMMUNITY): Payer: Self-pay | Admitting: General Surgery

## 2014-06-16 DIAGNOSIS — Z23 Encounter for immunization: Secondary | ICD-10-CM | POA: Diagnosis not present

## 2014-06-16 DIAGNOSIS — R454 Irritability and anger: Secondary | ICD-10-CM | POA: Diagnosis not present

## 2014-06-16 DIAGNOSIS — I10 Essential (primary) hypertension: Secondary | ICD-10-CM | POA: Diagnosis not present

## 2014-07-14 DIAGNOSIS — K449 Diaphragmatic hernia without obstruction or gangrene: Secondary | ICD-10-CM | POA: Diagnosis not present

## 2014-07-14 DIAGNOSIS — R160 Hepatomegaly, not elsewhere classified: Secondary | ICD-10-CM | POA: Diagnosis not present

## 2014-07-14 DIAGNOSIS — C499 Malignant neoplasm of connective and soft tissue, unspecified: Secondary | ICD-10-CM | POA: Diagnosis not present

## 2014-07-14 DIAGNOSIS — I700 Atherosclerosis of aorta: Secondary | ICD-10-CM | POA: Diagnosis not present

## 2014-07-14 DIAGNOSIS — C494 Malignant neoplasm of connective and soft tissue of abdomen: Secondary | ICD-10-CM | POA: Diagnosis not present

## 2014-07-14 DIAGNOSIS — R1900 Intra-abdominal and pelvic swelling, mass and lump, unspecified site: Secondary | ICD-10-CM | POA: Diagnosis not present

## 2014-07-14 DIAGNOSIS — I2510 Atherosclerotic heart disease of native coronary artery without angina pectoris: Secondary | ICD-10-CM | POA: Diagnosis not present

## 2014-07-14 DIAGNOSIS — J984 Other disorders of lung: Secondary | ICD-10-CM | POA: Diagnosis not present

## 2014-07-14 DIAGNOSIS — J9811 Atelectasis: Secondary | ICD-10-CM | POA: Diagnosis not present

## 2014-07-14 DIAGNOSIS — Z0389 Encounter for observation for other suspected diseases and conditions ruled out: Secondary | ICD-10-CM | POA: Diagnosis not present

## 2014-07-14 DIAGNOSIS — Z980 Intestinal bypass and anastomosis status: Secondary | ICD-10-CM | POA: Diagnosis not present

## 2014-07-14 DIAGNOSIS — R590 Localized enlarged lymph nodes: Secondary | ICD-10-CM | POA: Diagnosis not present

## 2014-07-14 DIAGNOSIS — D7389 Other diseases of spleen: Secondary | ICD-10-CM | POA: Diagnosis not present

## 2014-07-14 DIAGNOSIS — M778 Other enthesopathies, not elsewhere classified: Secondary | ICD-10-CM | POA: Diagnosis not present

## 2014-07-21 DIAGNOSIS — C179 Malignant neoplasm of small intestine, unspecified: Secondary | ICD-10-CM | POA: Diagnosis not present

## 2014-08-28 DIAGNOSIS — Z8500 Personal history of malignant neoplasm of unspecified digestive organ: Secondary | ICD-10-CM | POA: Diagnosis not present

## 2014-08-28 DIAGNOSIS — C179 Malignant neoplasm of small intestine, unspecified: Secondary | ICD-10-CM | POA: Diagnosis not present

## 2014-08-28 DIAGNOSIS — D500 Iron deficiency anemia secondary to blood loss (chronic): Secondary | ICD-10-CM | POA: Diagnosis present

## 2014-08-28 DIAGNOSIS — I10 Essential (primary) hypertension: Secondary | ICD-10-CM | POA: Diagnosis present

## 2014-08-28 DIAGNOSIS — C494 Malignant neoplasm of connective and soft tissue of abdomen: Secondary | ICD-10-CM | POA: Diagnosis not present

## 2014-08-28 DIAGNOSIS — Z87891 Personal history of nicotine dependence: Secondary | ICD-10-CM | POA: Diagnosis not present

## 2014-08-28 DIAGNOSIS — K2270 Barrett's esophagus without dysplasia: Secondary | ICD-10-CM | POA: Diagnosis present

## 2014-08-28 DIAGNOSIS — K565 Intestinal adhesions [bands] with obstruction (postprocedural) (postinfection): Secondary | ICD-10-CM | POA: Diagnosis not present

## 2014-08-28 DIAGNOSIS — T8189XA Other complications of procedures, not elsewhere classified, initial encounter: Secondary | ICD-10-CM | POA: Diagnosis present

## 2014-08-28 DIAGNOSIS — E785 Hyperlipidemia, unspecified: Secondary | ICD-10-CM | POA: Diagnosis present

## 2014-08-28 DIAGNOSIS — K219 Gastro-esophageal reflux disease without esophagitis: Secondary | ICD-10-CM | POA: Diagnosis present

## 2014-08-28 DIAGNOSIS — G893 Neoplasm related pain (acute) (chronic): Secondary | ICD-10-CM | POA: Diagnosis not present

## 2014-08-28 DIAGNOSIS — K660 Peritoneal adhesions (postprocedural) (postinfection): Secondary | ICD-10-CM | POA: Diagnosis present

## 2014-09-15 DIAGNOSIS — C179 Malignant neoplasm of small intestine, unspecified: Secondary | ICD-10-CM | POA: Diagnosis not present

## 2014-09-18 DIAGNOSIS — C499 Malignant neoplasm of connective and soft tissue, unspecified: Secondary | ICD-10-CM | POA: Diagnosis not present

## 2014-09-18 DIAGNOSIS — D490 Neoplasm of unspecified behavior of digestive system: Secondary | ICD-10-CM | POA: Diagnosis not present

## 2014-09-25 DIAGNOSIS — I2510 Atherosclerotic heart disease of native coronary artery without angina pectoris: Secondary | ICD-10-CM | POA: Diagnosis not present

## 2014-09-25 DIAGNOSIS — Z281 Immunization not carried out because of patient decision for reasons of belief or group pressure: Secondary | ICD-10-CM | POA: Diagnosis not present

## 2014-09-25 DIAGNOSIS — M5135 Other intervertebral disc degeneration, thoracolumbar region: Secondary | ICD-10-CM | POA: Diagnosis not present

## 2014-09-25 DIAGNOSIS — R590 Localized enlarged lymph nodes: Secondary | ICD-10-CM | POA: Diagnosis not present

## 2014-09-25 DIAGNOSIS — Z980 Intestinal bypass and anastomosis status: Secondary | ICD-10-CM | POA: Diagnosis not present

## 2014-09-25 DIAGNOSIS — Z9889 Other specified postprocedural states: Secondary | ICD-10-CM | POA: Diagnosis not present

## 2014-09-25 DIAGNOSIS — C494 Malignant neoplasm of connective and soft tissue of abdomen: Secondary | ICD-10-CM | POA: Diagnosis not present

## 2014-09-25 DIAGNOSIS — I700 Atherosclerosis of aorta: Secondary | ICD-10-CM | POA: Diagnosis not present

## 2014-09-25 DIAGNOSIS — N400 Enlarged prostate without lower urinary tract symptoms: Secondary | ICD-10-CM | POA: Diagnosis not present

## 2014-09-25 DIAGNOSIS — N3289 Other specified disorders of bladder: Secondary | ICD-10-CM | POA: Diagnosis not present

## 2014-09-25 DIAGNOSIS — D7389 Other diseases of spleen: Secondary | ICD-10-CM | POA: Diagnosis not present

## 2014-09-25 DIAGNOSIS — J9811 Atelectasis: Secondary | ICD-10-CM | POA: Diagnosis not present

## 2014-09-25 DIAGNOSIS — I708 Atherosclerosis of other arteries: Secondary | ICD-10-CM | POA: Diagnosis not present

## 2014-09-30 DIAGNOSIS — H538 Other visual disturbances: Secondary | ICD-10-CM | POA: Diagnosis not present

## 2014-09-30 DIAGNOSIS — H2513 Age-related nuclear cataract, bilateral: Secondary | ICD-10-CM | POA: Diagnosis not present

## 2014-10-19 DIAGNOSIS — I700 Atherosclerosis of aorta: Secondary | ICD-10-CM | POA: Diagnosis not present

## 2014-10-19 DIAGNOSIS — E785 Hyperlipidemia, unspecified: Secondary | ICD-10-CM | POA: Diagnosis not present

## 2014-10-19 DIAGNOSIS — I10 Essential (primary) hypertension: Secondary | ICD-10-CM | POA: Diagnosis not present

## 2015-02-27 DIAGNOSIS — R0781 Pleurodynia: Secondary | ICD-10-CM | POA: Diagnosis not present

## 2015-02-27 DIAGNOSIS — S2231XA Fracture of one rib, right side, initial encounter for closed fracture: Secondary | ICD-10-CM | POA: Diagnosis not present

## 2015-03-04 DIAGNOSIS — Z6830 Body mass index (BMI) 30.0-30.9, adult: Secondary | ICD-10-CM | POA: Diagnosis not present

## 2015-03-04 DIAGNOSIS — S2231XS Fracture of one rib, right side, sequela: Secondary | ICD-10-CM | POA: Diagnosis not present

## 2015-04-06 DIAGNOSIS — M9902 Segmental and somatic dysfunction of thoracic region: Secondary | ICD-10-CM | POA: Diagnosis not present

## 2015-04-06 DIAGNOSIS — M9905 Segmental and somatic dysfunction of pelvic region: Secondary | ICD-10-CM | POA: Diagnosis not present

## 2015-04-06 DIAGNOSIS — M545 Low back pain: Secondary | ICD-10-CM | POA: Diagnosis not present

## 2015-04-06 DIAGNOSIS — M9903 Segmental and somatic dysfunction of lumbar region: Secondary | ICD-10-CM | POA: Diagnosis not present

## 2015-04-09 DIAGNOSIS — M9905 Segmental and somatic dysfunction of pelvic region: Secondary | ICD-10-CM | POA: Diagnosis not present

## 2015-04-09 DIAGNOSIS — M9903 Segmental and somatic dysfunction of lumbar region: Secondary | ICD-10-CM | POA: Diagnosis not present

## 2015-04-09 DIAGNOSIS — M545 Low back pain: Secondary | ICD-10-CM | POA: Diagnosis not present

## 2015-04-09 DIAGNOSIS — M9902 Segmental and somatic dysfunction of thoracic region: Secondary | ICD-10-CM | POA: Diagnosis not present

## 2015-04-12 DIAGNOSIS — M9903 Segmental and somatic dysfunction of lumbar region: Secondary | ICD-10-CM | POA: Diagnosis not present

## 2015-04-12 DIAGNOSIS — M9905 Segmental and somatic dysfunction of pelvic region: Secondary | ICD-10-CM | POA: Diagnosis not present

## 2015-04-12 DIAGNOSIS — M545 Low back pain: Secondary | ICD-10-CM | POA: Diagnosis not present

## 2015-04-12 DIAGNOSIS — M9902 Segmental and somatic dysfunction of thoracic region: Secondary | ICD-10-CM | POA: Diagnosis not present

## 2015-04-16 DIAGNOSIS — Z483 Aftercare following surgery for neoplasm: Secondary | ICD-10-CM | POA: Diagnosis not present

## 2015-04-16 DIAGNOSIS — Z9852 Vasectomy status: Secondary | ICD-10-CM | POA: Diagnosis not present

## 2015-04-16 DIAGNOSIS — D7389 Other diseases of spleen: Secondary | ICD-10-CM | POA: Diagnosis not present

## 2015-04-16 DIAGNOSIS — R590 Localized enlarged lymph nodes: Secondary | ICD-10-CM | POA: Diagnosis not present

## 2015-04-16 DIAGNOSIS — N281 Cyst of kidney, acquired: Secondary | ICD-10-CM | POA: Diagnosis not present

## 2015-04-16 DIAGNOSIS — I700 Atherosclerosis of aorta: Secondary | ICD-10-CM | POA: Diagnosis not present

## 2015-04-16 DIAGNOSIS — Z980 Intestinal bypass and anastomosis status: Secondary | ICD-10-CM | POA: Diagnosis not present

## 2015-04-16 DIAGNOSIS — C494 Malignant neoplasm of connective and soft tissue of abdomen: Secondary | ICD-10-CM | POA: Diagnosis not present

## 2015-04-16 DIAGNOSIS — N400 Enlarged prostate without lower urinary tract symptoms: Secondary | ICD-10-CM | POA: Diagnosis not present

## 2015-04-16 DIAGNOSIS — M488X9 Other specified spondylopathies, site unspecified: Secondary | ICD-10-CM | POA: Diagnosis not present

## 2015-04-16 DIAGNOSIS — J9811 Atelectasis: Secondary | ICD-10-CM | POA: Diagnosis not present

## 2015-04-16 DIAGNOSIS — R160 Hepatomegaly, not elsewhere classified: Secondary | ICD-10-CM | POA: Diagnosis not present

## 2015-04-16 DIAGNOSIS — I2510 Atherosclerotic heart disease of native coronary artery without angina pectoris: Secondary | ICD-10-CM | POA: Diagnosis not present

## 2015-05-10 DIAGNOSIS — C494 Malignant neoplasm of connective and soft tissue of abdomen: Secondary | ICD-10-CM | POA: Diagnosis not present

## 2015-05-10 DIAGNOSIS — I10 Essential (primary) hypertension: Secondary | ICD-10-CM | POA: Diagnosis not present

## 2015-05-10 DIAGNOSIS — Z23 Encounter for immunization: Secondary | ICD-10-CM | POA: Diagnosis not present

## 2015-05-10 DIAGNOSIS — Z6829 Body mass index (BMI) 29.0-29.9, adult: Secondary | ICD-10-CM | POA: Diagnosis not present

## 2015-05-20 DIAGNOSIS — H35363 Drusen (degenerative) of macula, bilateral: Secondary | ICD-10-CM | POA: Diagnosis not present

## 2015-05-20 DIAGNOSIS — H2513 Age-related nuclear cataract, bilateral: Secondary | ICD-10-CM | POA: Diagnosis not present

## 2015-06-03 DIAGNOSIS — L259 Unspecified contact dermatitis, unspecified cause: Secondary | ICD-10-CM | POA: Diagnosis not present

## 2015-06-03 DIAGNOSIS — L309 Dermatitis, unspecified: Secondary | ICD-10-CM | POA: Diagnosis not present

## 2015-06-03 DIAGNOSIS — Z1159 Encounter for screening for other viral diseases: Secondary | ICD-10-CM | POA: Diagnosis not present

## 2015-06-03 DIAGNOSIS — Z1283 Encounter for screening for malignant neoplasm of skin: Secondary | ICD-10-CM | POA: Diagnosis not present

## 2015-06-14 DIAGNOSIS — M9905 Segmental and somatic dysfunction of pelvic region: Secondary | ICD-10-CM | POA: Diagnosis not present

## 2015-06-14 DIAGNOSIS — M545 Low back pain: Secondary | ICD-10-CM | POA: Diagnosis not present

## 2015-06-14 DIAGNOSIS — M9903 Segmental and somatic dysfunction of lumbar region: Secondary | ICD-10-CM | POA: Diagnosis not present

## 2015-06-14 DIAGNOSIS — M9902 Segmental and somatic dysfunction of thoracic region: Secondary | ICD-10-CM | POA: Diagnosis not present

## 2015-06-16 DIAGNOSIS — M9902 Segmental and somatic dysfunction of thoracic region: Secondary | ICD-10-CM | POA: Diagnosis not present

## 2015-06-16 DIAGNOSIS — M9903 Segmental and somatic dysfunction of lumbar region: Secondary | ICD-10-CM | POA: Diagnosis not present

## 2015-06-16 DIAGNOSIS — M9905 Segmental and somatic dysfunction of pelvic region: Secondary | ICD-10-CM | POA: Diagnosis not present

## 2015-06-16 DIAGNOSIS — M545 Low back pain: Secondary | ICD-10-CM | POA: Diagnosis not present

## 2015-06-18 DIAGNOSIS — M9902 Segmental and somatic dysfunction of thoracic region: Secondary | ICD-10-CM | POA: Diagnosis not present

## 2015-06-18 DIAGNOSIS — M9905 Segmental and somatic dysfunction of pelvic region: Secondary | ICD-10-CM | POA: Diagnosis not present

## 2015-06-18 DIAGNOSIS — M9903 Segmental and somatic dysfunction of lumbar region: Secondary | ICD-10-CM | POA: Diagnosis not present

## 2015-06-18 DIAGNOSIS — M545 Low back pain: Secondary | ICD-10-CM | POA: Diagnosis not present

## 2015-06-21 DIAGNOSIS — M9903 Segmental and somatic dysfunction of lumbar region: Secondary | ICD-10-CM | POA: Diagnosis not present

## 2015-06-21 DIAGNOSIS — M9905 Segmental and somatic dysfunction of pelvic region: Secondary | ICD-10-CM | POA: Diagnosis not present

## 2015-06-21 DIAGNOSIS — M9902 Segmental and somatic dysfunction of thoracic region: Secondary | ICD-10-CM | POA: Diagnosis not present

## 2015-06-21 DIAGNOSIS — M545 Low back pain: Secondary | ICD-10-CM | POA: Diagnosis not present

## 2015-10-15 DIAGNOSIS — Z9852 Vasectomy status: Secondary | ICD-10-CM | POA: Diagnosis not present

## 2015-10-15 DIAGNOSIS — C494 Malignant neoplasm of connective and soft tissue of abdomen: Secondary | ICD-10-CM | POA: Diagnosis not present

## 2015-10-15 DIAGNOSIS — Z980 Intestinal bypass and anastomosis status: Secondary | ICD-10-CM | POA: Diagnosis not present

## 2015-10-15 DIAGNOSIS — D7389 Other diseases of spleen: Secondary | ICD-10-CM | POA: Diagnosis not present

## 2015-10-15 DIAGNOSIS — C49A3 Gastrointestinal stromal tumor of small intestine: Secondary | ICD-10-CM | POA: Diagnosis not present

## 2015-10-15 DIAGNOSIS — R590 Localized enlarged lymph nodes: Secondary | ICD-10-CM | POA: Diagnosis not present

## 2015-10-15 DIAGNOSIS — Z9049 Acquired absence of other specified parts of digestive tract: Secondary | ICD-10-CM | POA: Diagnosis not present

## 2015-10-15 DIAGNOSIS — Z125 Encounter for screening for malignant neoplasm of prostate: Secondary | ICD-10-CM | POA: Diagnosis not present

## 2015-10-15 DIAGNOSIS — N281 Cyst of kidney, acquired: Secondary | ICD-10-CM | POA: Diagnosis not present

## 2015-10-15 DIAGNOSIS — I708 Atherosclerosis of other arteries: Secondary | ICD-10-CM | POA: Diagnosis not present

## 2015-10-15 DIAGNOSIS — R160 Hepatomegaly, not elsewhere classified: Secondary | ICD-10-CM | POA: Diagnosis not present

## 2015-11-03 DIAGNOSIS — I10 Essential (primary) hypertension: Secondary | ICD-10-CM | POA: Diagnosis not present

## 2015-11-03 DIAGNOSIS — Z6830 Body mass index (BMI) 30.0-30.9, adult: Secondary | ICD-10-CM | POA: Diagnosis not present

## 2015-11-03 DIAGNOSIS — I700 Atherosclerosis of aorta: Secondary | ICD-10-CM | POA: Diagnosis not present

## 2015-11-26 DIAGNOSIS — M545 Low back pain: Secondary | ICD-10-CM | POA: Diagnosis not present

## 2016-01-06 DIAGNOSIS — H109 Unspecified conjunctivitis: Secondary | ICD-10-CM | POA: Diagnosis not present

## 2016-01-06 DIAGNOSIS — H1032 Unspecified acute conjunctivitis, left eye: Secondary | ICD-10-CM | POA: Diagnosis not present

## 2016-02-12 ENCOUNTER — Emergency Department (HOSPITAL_COMMUNITY)
Admission: EM | Admit: 2016-02-12 | Discharge: 2016-02-12 | Disposition: A | Discharge status: Home / Self Care | Payer: Medicare Other | Attending: Emergency Medicine | Admitting: Emergency Medicine

## 2016-02-12 ENCOUNTER — Encounter (HOSPITAL_COMMUNITY): Payer: Self-pay | Admitting: Emergency Medicine

## 2016-02-12 DIAGNOSIS — Z79899 Other long term (current) drug therapy: Secondary | ICD-10-CM | POA: Insufficient documentation

## 2016-02-12 DIAGNOSIS — Y929 Unspecified place or not applicable: Secondary | ICD-10-CM | POA: Diagnosis not present

## 2016-02-12 DIAGNOSIS — S59911A Unspecified injury of right forearm, initial encounter: Secondary | ICD-10-CM | POA: Diagnosis present

## 2016-02-12 DIAGNOSIS — S51811A Laceration without foreign body of right forearm, initial encounter: Secondary | ICD-10-CM | POA: Diagnosis not present

## 2016-02-12 DIAGNOSIS — I10 Essential (primary) hypertension: Secondary | ICD-10-CM | POA: Insufficient documentation

## 2016-02-12 DIAGNOSIS — Y9389 Activity, other specified: Secondary | ICD-10-CM | POA: Insufficient documentation

## 2016-02-12 DIAGNOSIS — Z87891 Personal history of nicotine dependence: Secondary | ICD-10-CM | POA: Insufficient documentation

## 2016-02-12 DIAGNOSIS — W260XXA Contact with knife, initial encounter: Secondary | ICD-10-CM | POA: Insufficient documentation

## 2016-02-12 DIAGNOSIS — E785 Hyperlipidemia, unspecified: Secondary | ICD-10-CM | POA: Insufficient documentation

## 2016-02-12 DIAGNOSIS — Y999 Unspecified external cause status: Secondary | ICD-10-CM | POA: Diagnosis not present

## 2016-02-12 MED ORDER — LIDOCAINE-EPINEPHRINE (PF) 2 %-1:200000 IJ SOLN
10.0000 mL | Freq: Once | INTRAMUSCULAR | Status: AC
  Administered 2016-02-12: 10 mL
  Filled 2016-02-12: qty 20

## 2016-02-12 MED ORDER — TETANUS-DIPHTH-ACELL PERTUSSIS 5-2.5-18.5 LF-MCG/0.5 IM SUSP
0.5000 mL | Freq: Once | INTRAMUSCULAR | Status: AC
  Administered 2016-02-12: 0.5 mL via INTRAMUSCULAR
  Filled 2016-02-12: qty 0.5

## 2016-02-12 MED ORDER — POVIDONE-IODINE 10 % EX SOLN
CUTANEOUS | Status: AC
  Filled 2016-02-12: qty 118

## 2016-02-12 NOTE — ED Provider Notes (Signed)
CSN: [id number]     Arrival date & time 02/12/16  1014 History   First MD Initiated Contact with Patient 02/12/16 1032     Chief Complaint  Patient presents with  . Puncture Wound     (Consider location/radiation/quality/duration/timing/severity/associated sxs/prior Treatment) The history is provided by the patient.   David Weiss is a 69 y.o. male with past medical history as outlined below presenting with a laceration to his right forearm which occurred just prior to arrival.  He was using a fish fillet knife to try to open up at backup potting soil when it slipped and punctured his right mid forearm.  He reports copious bleeding which has since resolved after applying pressure.  He denies numbness or pain distal to the injury site.  Patient is unsure of the date of his last tetanus vaccine, per chart he is due for his tetanus in 5 years.  He has no other concerns or complaints.    Past Medical History  Diagnosis Date  . Barrett's esophagus 5/11    EGD Dr. Phillips Climes Barrett's, multiple antral/bulbar erosins, D2 erosion sec NSAIDs  . GERD (gastroesophageal reflux disease)   . HTN (hypertension)   . Hyperlipemia   . Helicobacter pylori gastritis 5/11  . Diverticulosis 5/11    Colonoscopy Dr Gala Romney  . Hx of adenomatous colonic polyps 8/10    Colonoscopy Dr. Gala Romney  . Family hx of colon cancer   . Schatzki's ring 5/11    13F dilator  . Anemia    Past Surgical History  Procedure Laterality Date  . Capsule endoscopy of the small bowel  02/17/2011       . Esophagogastroduodenoscopy  02/13/2011    Procedure: ESOPHAGOGASTRODUODENOSCOPY (EGD);  Surgeon: Daneil Dolin, MD;  Location: [facility];  Service: Endoscopy;  Laterality: N/A;  . Givens capsule study  02/15/2011    Procedure: GIVENS CAPSULE STUDY;  Surgeon: Daneil Dolin, MD;  Location: [facility];  Service: Endoscopy;  Laterality: N/A;  . Vasectomy    . Colonoscopy  05/19/2011    Procedure: COLONOSCOPY;  Surgeon:  Daneil Dolin, MD;  Location: [facility];  Service: Endoscopy;  Laterality: N/A;  8:30  . Small intestine partial removal  06/2011    due to malignant tumor  . Excisional hernia  01/2013  . Colonoscopy N/A 08/21/2013    Procedure: COLONOSCOPY;  Surgeon: Rogene Houston, MD;  Location: [facility];  Service: Endoscopy;  Laterality: N/A;  930  . Ear cyst excision N/A 03/25/2014    Procedure: EXCISION OF NEOPLASM NECK;  Surgeon: Jamesetta So, MD;  Location: [facility];  Service: General;  Laterality: N/A;   Family History  Problem Relation Age of Onset  . Prostate cancer Father 20  . Diabetes Mother   . Heart failure Mother   . Colon cancer Brother 48   Social History  Substance Use Topics  . Smoking status: Former Smoker -- 1.50 packs/day for 30 years    Types: Cigarettes    Quit date: 03/18/1981  . Smokeless tobacco: Never Used     Comment: quit 1982  . Alcohol Use: Yes     Comment: two drinks a day bourbon    Review of Systems  Constitutional: Negative for fever and chills.  Respiratory: Negative for shortness of breath and wheezing.   Skin: Positive for wound.  Neurological: Negative for weakness and numbness.      Allergies  Penicillins and Pantoprazole sodium  Home Medications  Prior to Admission medications   Medication Sig Start Date End Date Taking? Authorizing Provider  acetaminophen (TYLENOL) 500 MG tablet Take 500 mg by mouth every 6 (six) hours as needed.   Yes Historical Provider, MD  amLODipine (NORVASC) 5 MG tablet Take 5 mg by mouth daily.     Yes Historical Provider, MD  atorvastatin (LIPITOR) 10 MG tablet Take 10 mg by mouth daily.     Yes Historical Provider, MD  benazepril (LOTENSIN) 10 MG tablet Take 10 mg by mouth daily.     Yes Historical Provider, MD  omeprazole (PRILOSEC) 20 MG capsule Take 20 mg by mouth daily.   Yes Historical Provider, MD   BP 150/84 mmHg  Pulse 61  Temp(Src) 98.8 F (37.1 C) (Oral)  Resp 22  Ht 5\' 9"  (1.753 m)  Wt  87.091 kg  BMI 28.34 kg/m2  SpO2 95% Physical Exam  Constitutional: He is oriented to person, place, and time. He appears well-developed and well-nourished.  HENT:  Head: Normocephalic.  Cardiovascular: Normal rate.   Pulmonary/Chest: Effort normal.  Musculoskeletal: He exhibits tenderness.  Patient displays full range of motion with right forearm including the supination pronation.  No pain or forearm deformity with flexion or extension of the wrist or fingers.  Radial pulses intact.  Distal sensation is intact with less than 2 second fingertip cap Refill.  Neurological: He is alert and oriented to person, place, and time. No sensory deficit.  Skin: Laceration noted.    ED Course  Procedures (including critical care time)  LACERATION REPAIR Performed by: Evalee Jefferson Authorized by: Evalee Jefferson Consent: Verbal consent obtained. Risks and benefits: risks, benefits and alternatives were discussed Consent given by: patient Patient identity confirmed: provided demographic data Prepped and Draped in normal sterile fashion Wound explored  Laceration Location: right forearm  Laceration Length: 1cm  No Foreign Bodies seen or palpated  Anesthesia: local infiltration  Local anesthetic: lidocaine 2% with epinephrine  Anesthetic total: 2 ml  Irrigation method: syringe Amount of cleaning: standard  Skin closure: ethilon 4-0  Number of sutures: 2  Technique: simple interupted  Patient tolerance: Patient tolerated the procedure well with no immediate complications.  Labs Review Labs Reviewed - No data to display  Imaging Review No results found. I have personally reviewed and evaluated these images and lab results as part of my medical decision-making.   EKG Interpretation None      MDM   Final diagnoses:  Forearm laceration, right, initial encounter    Tetanus was updated today given dirty wound.  Wound care instructions given.  Pt advised to have sutures removed  in 10 days,  Return here sooner for any signs of infection including redness, swelling, worse pain or drainage of pus.       Evalee Jefferson, PA-C 02/12/16 [place], DO 02/13/16 [phone]

## 2016-02-12 NOTE — Discharge Instructions (Signed)

## 2016-02-12 NOTE — ED Notes (Signed)
Patient has puncture wound to right forearm. Per patient was trying to open bag of top soil with knife. No active bleeding noted at this time. Patient denies taking any blood thinners. Patient unsure of last tetanus vaccination.

## 2016-02-12 NOTE — ED Notes (Signed)
Accidental puncture wound to right forearm from fillet knife. Slight swelling. bleeding controlled. Last tetanus unknown.

## 2016-04-14 DIAGNOSIS — C49A3 Gastrointestinal stromal tumor of small intestine: Secondary | ICD-10-CM | POA: Diagnosis not present

## 2016-04-14 DIAGNOSIS — Z9049 Acquired absence of other specified parts of digestive tract: Secondary | ICD-10-CM | POA: Diagnosis not present

## 2016-04-14 DIAGNOSIS — Z08 Encounter for follow-up examination after completed treatment for malignant neoplasm: Secondary | ICD-10-CM | POA: Diagnosis not present

## 2016-04-14 DIAGNOSIS — Z85831 Personal history of malignant neoplasm of soft tissue: Secondary | ICD-10-CM | POA: Diagnosis not present

## 2016-04-14 DIAGNOSIS — Z8500 Personal history of malignant neoplasm of unspecified digestive organ: Secondary | ICD-10-CM | POA: Diagnosis not present

## 2016-04-14 DIAGNOSIS — C49A0 Gastrointestinal stromal tumor, unspecified site: Secondary | ICD-10-CM | POA: Diagnosis not present

## 2016-05-01 DIAGNOSIS — E785 Hyperlipidemia, unspecified: Secondary | ICD-10-CM | POA: Diagnosis not present

## 2016-05-08 DIAGNOSIS — Z23 Encounter for immunization: Secondary | ICD-10-CM | POA: Diagnosis not present

## 2016-05-08 DIAGNOSIS — E785 Hyperlipidemia, unspecified: Secondary | ICD-10-CM | POA: Diagnosis not present

## 2016-05-08 DIAGNOSIS — I10 Essential (primary) hypertension: Secondary | ICD-10-CM | POA: Diagnosis not present

## 2016-05-08 DIAGNOSIS — K2270 Barrett's esophagus without dysplasia: Secondary | ICD-10-CM | POA: Diagnosis not present

## 2016-05-09 ENCOUNTER — Encounter (INDEPENDENT_AMBULATORY_CARE_PROVIDER_SITE_OTHER): Payer: Self-pay | Admitting: *Deleted

## 2016-05-10 ENCOUNTER — Other Ambulatory Visit (INDEPENDENT_AMBULATORY_CARE_PROVIDER_SITE_OTHER): Payer: Self-pay | Admitting: *Deleted

## 2016-05-10 DIAGNOSIS — K2270 Barrett's esophagus without dysplasia: Secondary | ICD-10-CM | POA: Insufficient documentation

## 2016-07-28 ENCOUNTER — Telehealth (INDEPENDENT_AMBULATORY_CARE_PROVIDER_SITE_OTHER): Payer: Self-pay | Admitting: *Deleted

## 2016-07-28 NOTE — Telephone Encounter (Signed)
Referring MD/PCP: fagan   Procedure: egd w/ biopsy  Reason/Indication:  Barrett's esophagus  Has patient had this procedure before?  Yes, 2012  If so, when, by whom and where?    Is there a family history of colon cancer?    Who?  What age when diagnosed?    Is patient diabetic?   no      Does patient have prosthetic heart valve or mechanical valve?  no  Do you have a pacemaker?  no  Has patient ever had endocarditis? no  Has patient had joint replacement within last 12 months?  no  Does patient tend to be constipated or take laxatives? n  Does patient have a history of alcohol/drug use?  no  Is patient on Coumadin, Plavix and/or Aspirin? no  Medications: omeprazole 20 mg daily, benazepril 10 mg daily, amlodipine 5 mg daily, atorvastatin 10 mg daily  Allergies: pcn, nxeium  Medication Adjustment:   Procedure date & time: 08/30/16 at 730

## 2016-08-01 NOTE — Telephone Encounter (Signed)
agree

## 2016-08-30 ENCOUNTER — Encounter (HOSPITAL_COMMUNITY): Payer: Self-pay | Admitting: *Deleted

## 2016-08-30 ENCOUNTER — Encounter (HOSPITAL_COMMUNITY)
Admission: RE | Disposition: A | Discharge status: Home / Self Care | Payer: Self-pay | Source: Ambulatory Visit | Attending: Internal Medicine

## 2016-08-30 ENCOUNTER — Ambulatory Visit (HOSPITAL_COMMUNITY)
Admission: RE | Admit: 2016-08-30 | Discharge: 2016-08-30 | Disposition: A | Discharge status: Home / Self Care | Payer: Medicare Other | Source: Ambulatory Visit | Attending: Internal Medicine | Admitting: Internal Medicine

## 2016-08-30 DIAGNOSIS — Z8249 Family history of ischemic heart disease and other diseases of the circulatory system: Secondary | ICD-10-CM | POA: Insufficient documentation

## 2016-08-30 DIAGNOSIS — Z800 Family history of malignant neoplasm of digestive organs: Secondary | ICD-10-CM | POA: Insufficient documentation

## 2016-08-30 DIAGNOSIS — I10 Essential (primary) hypertension: Secondary | ICD-10-CM | POA: Diagnosis not present

## 2016-08-30 DIAGNOSIS — Z888 Allergy status to other drugs, medicaments and biological substances status: Secondary | ICD-10-CM | POA: Diagnosis not present

## 2016-08-30 DIAGNOSIS — Z8042 Family history of malignant neoplasm of prostate: Secondary | ICD-10-CM | POA: Diagnosis not present

## 2016-08-30 DIAGNOSIS — K2270 Barrett's esophagus without dysplasia: Secondary | ICD-10-CM | POA: Diagnosis not present

## 2016-08-30 DIAGNOSIS — K228 Other specified diseases of esophagus: Secondary | ICD-10-CM | POA: Insufficient documentation

## 2016-08-30 DIAGNOSIS — K449 Diaphragmatic hernia without obstruction or gangrene: Secondary | ICD-10-CM

## 2016-08-30 DIAGNOSIS — K219 Gastro-esophageal reflux disease without esophagitis: Secondary | ICD-10-CM | POA: Insufficient documentation

## 2016-08-30 DIAGNOSIS — E785 Hyperlipidemia, unspecified: Secondary | ICD-10-CM | POA: Diagnosis not present

## 2016-08-30 DIAGNOSIS — Z79899 Other long term (current) drug therapy: Secondary | ICD-10-CM | POA: Insufficient documentation

## 2016-08-30 DIAGNOSIS — Z833 Family history of diabetes mellitus: Secondary | ICD-10-CM | POA: Insufficient documentation

## 2016-08-30 DIAGNOSIS — Z85068 Personal history of other malignant neoplasm of small intestine: Secondary | ICD-10-CM | POA: Diagnosis not present

## 2016-08-30 DIAGNOSIS — Z880 Allergy status to penicillin: Secondary | ICD-10-CM | POA: Diagnosis not present

## 2016-08-30 DIAGNOSIS — Z87891 Personal history of nicotine dependence: Secondary | ICD-10-CM | POA: Diagnosis not present

## 2016-08-30 DIAGNOSIS — Z9049 Acquired absence of other specified parts of digestive tract: Secondary | ICD-10-CM | POA: Insufficient documentation

## 2016-08-30 DIAGNOSIS — Z9889 Other specified postprocedural states: Secondary | ICD-10-CM | POA: Insufficient documentation

## 2016-08-30 HISTORY — PX: BIOPSY: SHX5522

## 2016-08-30 HISTORY — PX: ESOPHAGOGASTRODUODENOSCOPY: SHX5428

## 2016-08-30 SURGERY — EGD (ESOPHAGOGASTRODUODENOSCOPY)
Anesthesia: Moderate Sedation

## 2016-08-30 MED ORDER — MIDAZOLAM HCL 5 MG/5ML IJ SOLN
INTRAMUSCULAR | Status: DC | PRN
  Administered 2016-08-30: 2 mg via INTRAVENOUS
  Administered 2016-08-30: 1 mg via INTRAVENOUS
  Administered 2016-08-30 (×2): 2 mg via INTRAVENOUS
  Administered 2016-08-30: 1 mg via INTRAVENOUS
  Administered 2016-08-30: 2 mg via INTRAVENOUS

## 2016-08-30 MED ORDER — MEPERIDINE HCL 50 MG/ML IJ SOLN
INTRAMUSCULAR | Status: DC | PRN
  Administered 2016-08-30 (×2): 25 mg via INTRAVENOUS

## 2016-08-30 MED ORDER — MEPERIDINE HCL 50 MG/ML IJ SOLN
INTRAMUSCULAR | Status: AC
  Filled 2016-08-30: qty 1

## 2016-08-30 MED ORDER — MIDAZOLAM HCL 5 MG/5ML IJ SOLN
INTRAMUSCULAR | Status: AC
  Filled 2016-08-30: qty 10

## 2016-08-30 MED ORDER — SODIUM CHLORIDE 0.9 % IV SOLN
INTRAVENOUS | Status: DC
  Administered 2016-08-30: 1000 mL via INTRAVENOUS

## 2016-08-30 MED ORDER — BUTAMBEN-TETRACAINE-BENZOCAINE 2-2-14 % EX AERO
INHALATION_SPRAY | CUTANEOUS | Status: DC | PRN
  Administered 2016-08-30: 2 via TOPICAL

## 2016-08-30 NOTE — Op Note (Signed)
[facility] Patient Name: David Weiss Procedure Date: 08/30/2016 7:29 AM MRN: [id number] Date of Birth: [birth year] Attending MD: Hildred Laser , MD CSN: [id number] Age: 71 Admit Type: Outpatient Procedure:                Upper GI endoscopy Indications:              Surveillance for malignancy due to personal history                            of Barrett's esophagus Providers:                Hildred Laser, MD, Lurline Del, RN, Isabella Stalling,                            Technician Referring MD:             Asencion Noble, Md Medicines:                Cetacaine spray, Meperidine 50 mg IV, Midazolam 10                            mg IV Complications:            No immediate complications. Estimated Blood Loss:     Estimated blood loss was minimal. Procedure:                Pre-Anesthesia Assessment:                           - Prior to the procedure, a History and Physical                            was performed, and patient medications and                            allergies were reviewed. The patient's tolerance of                            previous anesthesia was also reviewed. The risks                            and benefits of the procedure and the sedation                            options and risks were discussed with the patient.                            All questions were answered, and informed consent                            was obtained. Prior Anticoagulants: The patient has                            taken no previous anticoagulant or antiplatelet  agents. ASA Grade Assessment: II - A patient with                            mild systemic disease. After reviewing the risks                            and benefits, the patient was deemed in                            satisfactory condition to undergo the procedure.                           After obtaining informed consent, the endoscope was                            passed under direct vision.  Throughout the                            procedure, the patient's blood pressure, pulse, and                            oxygen saturations were monitored continuously. The                            [id number]) scope was introduced through the                            mouth, and advanced to the second part of duodenum.                            The upper GI endoscopy was accomplished without                            difficulty. The patient tolerated the procedure                            well. Scope In: 7:50:45 AM Scope Out: 8:04:24 AM Total Procedure Duration: 0 hours 13 minutes 39 seconds  Findings:      The upper third of the esophagus and middle third of the esophagus were       normal.      There were esophageal mucosal changes secondary to established       short-segment Barrett's disease present in the distal esophagus. The       maximum longitudinal extent of these mucosal changes was 1 cm in length.       Mucosa was biopsied with a cold forceps for histology in a targeted       manner at the gastroesophageal junction. One specimen bottle was sent to       pathology.      The Z-line was irregular and was found 38 cm from the incisors.      A 2 cm hiatal hernia was present.      The entire examined stomach was normal.      The duodenal bulb and second portion of the duodenum were normal. Impression:               -  Normal upper third of esophagus and middle third                            of esophagus.                           - Esophageal mucosal changes secondary to                            established short-segment Barrett's disease.                            Biopsied.                           - Z-line irregular, 38 cm from the incisors.                           - 2 cm hiatal hernia.                           - Normal stomach.                           - Normal duodenal bulb and second portion of the                            duodenum. Moderate  Sedation:      Moderate (conscious) sedation was administered by the endoscopy nurse       and supervised by the endoscopist. The following parameters were       monitored: oxygen saturation, heart rate, blood pressure, CO2       capnography and response to care. Total physician intraservice time was       20 minutes. Recommendation:           - Patient has a contact number available for                            emergencies. The signs and symptoms of potential                            delayed complications were discussed with the                            patient. Return to normal activities tomorrow.                            Written discharge instructions were provided to the                            patient.                           - Resume previous diet today.                           - Continue present medications.                           -  Await pathology results.                           - Repeat upper endoscopy in 5 years for                            surveillance. Procedure Code(s):        --- Professional ---                           [phone], Esophagogastroduodenoscopy, flexible,                            transoral; with biopsy, single or multiple                           99152, Moderate sedation services provided by the                            same physician or other qualified health care                            professional performing the diagnostic or                            therapeutic service that the sedation supports,                            requiring the presence of an independent trained                            observer to assist in the monitoring of the                            patient's level of consciousness and physiological                            status; initial 15 minutes of intraservice time,                            patient age 40 years or older Diagnosis Code(s):        --- Professional ---                            K22.70, Barrett's esophagus without dysplasia                           K22.8, Other specified diseases of esophagus                           K44.9, Diaphragmatic hernia without obstruction or                            gangrene CPT copyright 2016 American Medical Association. All rights reserved. The codes documented in this report are preliminary and upon coder review may  be  revised to meet current compliance requirements. Hildred Laser, MD Hildred Laser, MD 08/30/2016 8:25:00 AM This report has been signed electronically. Number of Addenda: 0

## 2016-08-30 NOTE — H&P (Addendum)
David Weiss is an 71 y.o. male.   Chief Complaint: Patient is here for EGD. HPI: Patient is 71 year old Caucasian male who was chronic GERD complicated by short segment Barrett's esophagus was here for surveillance EGD. He says heartburns well controlled with therapy. He denies dysphagia nausea vomiting abdominal pain or melena. Last EGD was in July 2012.  Past Medical History:  Diagnosis Date  . Anemia   . Barrett's esophagus 5/11   EGD Dr. Phillips Climes Barrett's, multiple antral/bulbar erosins, D2 erosion sec NSAIDs  . Diverticulosis 5/11   Colonoscopy Dr Gala Romney  . Family hx of colon cancer   . GERD (gastroesophageal reflux disease)   . Helicobacter pylori gastritis 5/11  . HTN (hypertension)   . Hx of adenomatous colonic polyps 8/10   Colonoscopy Dr. Gala Romney  . Hyperlipemia   . Schatzki's ring 5/11   65F dilator    Past Surgical History:  Procedure Laterality Date  . CAPSULE ENDOSCOPY OF THE SMALL BOWEL  02/17/2011      . COLONOSCOPY  05/19/2011   Procedure: COLONOSCOPY;  Surgeon: Daneil Dolin, MD;  Location: [facility];  Service: Endoscopy;  Laterality: N/A;  8:30  . COLONOSCOPY N/A 08/21/2013   Procedure: COLONOSCOPY;  Surgeon: Rogene Houston, MD;  Location: [facility];  Service: Endoscopy;  Laterality: N/A;  930  . EAR CYST EXCISION N/A 03/25/2014   Procedure: EXCISION OF NEOPLASM NECK;  Surgeon: Jamesetta So, MD;  Location: [facility];  Service: General;  Laterality: N/A;  . ESOPHAGOGASTRODUODENOSCOPY  02/13/2011   Procedure: ESOPHAGOGASTRODUODENOSCOPY (EGD);  Surgeon: Daneil Dolin, MD;  Location: [facility];  Service: Endoscopy;  Laterality: N/A;  . excisional hernia  01/2013  . GIVENS CAPSULE STUDY  02/15/2011   Procedure: GIVENS CAPSULE STUDY;  Surgeon: Daneil Dolin, MD;  Location: [facility];  Service: Endoscopy;  Laterality: N/A;  . small intestine partial removal  06/2011   due to malignant tumor  . VASECTOMY      Family History  Problem Relation  Age of Onset  . Diabetes Mother   . Heart failure Mother   . Prostate cancer Father 66  . Colon cancer Brother 84   Social History:  reports that he quit smoking about 35 years ago. His smoking use included Cigarettes. He has a 45.00 pack-year smoking history. He has never used smokeless tobacco. He reports that he drinks alcohol. He reports that he does not use drugs.  Allergies:  PCN.  Type of reaction unknown. Pantoprazole resulted in itching.  Medications Prior to Admission  Medication Sig Dispense Refill  . amLODipine (NORVASC) 5 MG tablet Take 5 mg by mouth daily.      Marland Kitchen atorvastatin (LIPITOR) 10 MG tablet Take 10 mg by mouth daily.      . benazepril (LOTENSIN) 10 MG tablet Take 10 mg by mouth daily.      Marland Kitchen BIOTIN PO Take 1 tablet by mouth once a week.    . Methylcellulose, Laxative, (FIBER THERAPY PO) Take 1 tablet by mouth daily.    Marland Kitchen omeprazole (PRILOSEC) 20 MG capsule Take 20 mg by mouth daily.    Marland Kitchen OVER THE COUNTER MEDICATION Take 1 tablet by mouth at bedtime as needed (sleep aid).      No results found for this or any previous visit (from the past 48 hour(s)). No results found.  ROS  Blood pressure 135/74, pulse 61, temperature 98.1 F (36.7 C), temperature source Oral, resp. rate 18, height  5\' 8"  (1.727 m), weight 193 lb (87.5 kg), SpO2 96 %. Physical Exam  Constitutional: He appears well-developed and well-nourished.  HENT:  Mouth/Throat: Oropharynx is clear and moist.  Eyes: Conjunctivae are normal. No scleral icterus.  Neck: No thyromegaly present.  Cardiovascular: Normal rate, regular rhythm and normal heart sounds.   No murmur heard. Respiratory: Effort normal and breath sounds normal.  GI: Soft. He exhibits no distension and no mass. There is no tenderness.  Musculoskeletal: He exhibits no edema.  Lymphadenopathy:    He has no cervical adenopathy.  Neurological: He is alert.  Skin: Skin is warm and dry.     Assessment/Plan Chronic GERD complicated by  short segment Barrett's esophagus. Surveillance EGD.  Hildred Laser, MD 08/30/2016, 7:38 AM

## 2016-08-30 NOTE — Discharge Instructions (Signed)
Esophagogastroduodenoscopy, Care After Introduction Refer to this sheet in the next few weeks. These instructions provide you with information about caring for yourself after your procedure. Your health care provider may also give you more specific instructions. Your treatment has been planned according to current medical practices, but problems sometimes occur. Call your health care provider if you have any problems or questions after your procedure. What can I expect after the procedure? After the procedure, it is common to have:  A sore throat.  Nausea.  Bloating.  Dizziness.  Fatigue. Follow these instructions at home:  Do not eat or drink anything until the numbing medicine (local anesthetic) has worn off and your gag reflex has returned. You will know that the local anesthetic has worn off when you can swallow comfortably.  Do not drive for 24 hours if you received a medicine to help you relax (sedative).  If your health care provider took a tissue sample for testing during the procedure, make sure to get your test results. This is your responsibility. Ask your health care provider or the department performing the test when your results will be ready.  Keep all follow-up visits as told by your health care provider. This is important. Contact a health care provider if:  You cannot stop coughing.  You are not urinating.  You are urinating less than usual. Get help right away if:  You have trouble swallowing.  You cannot eat or drink.  You have throat or chest pain that gets worse.  You are dizzy or light-headed.  You faint.  You have nausea or vomiting.  You have chills.  You have a fever.  You have severe abdominal pain.  You have black, tarry, or bloody stools. This information is not intended to replace advice given to you by your health care provider. Make sure you discuss any questions you have with your health care provider. Document Released: 07/03/2012  Document Revised: 12/23/2015 Document Reviewed: 06/10/2015  2017 Elsevier Resume usual medications and diet. No driving for 24 hours. Physician will call with biopsy results.

## 2016-09-06 ENCOUNTER — Encounter (HOSPITAL_COMMUNITY): Payer: Self-pay | Admitting: Internal Medicine

## 2016-11-07 DIAGNOSIS — Z6828 Body mass index (BMI) 28.0-28.9, adult: Secondary | ICD-10-CM | POA: Diagnosis not present

## 2016-11-07 DIAGNOSIS — I10 Essential (primary) hypertension: Secondary | ICD-10-CM | POA: Diagnosis not present

## 2016-11-07 DIAGNOSIS — R21 Rash and other nonspecific skin eruption: Secondary | ICD-10-CM | POA: Diagnosis not present

## 2016-12-13 DIAGNOSIS — D2230 Melanocytic nevi of unspecified part of face: Secondary | ICD-10-CM | POA: Diagnosis not present

## 2016-12-13 DIAGNOSIS — D485 Neoplasm of uncertain behavior of skin: Secondary | ICD-10-CM | POA: Diagnosis not present

## 2016-12-13 DIAGNOSIS — L400 Psoriasis vulgaris: Secondary | ICD-10-CM | POA: Diagnosis not present

## 2016-12-13 DIAGNOSIS — L821 Other seborrheic keratosis: Secondary | ICD-10-CM | POA: Diagnosis not present

## 2017-05-10 DIAGNOSIS — Z125 Encounter for screening for malignant neoplasm of prostate: Secondary | ICD-10-CM | POA: Diagnosis not present

## 2017-05-10 DIAGNOSIS — R7301 Impaired fasting glucose: Secondary | ICD-10-CM | POA: Diagnosis not present

## 2017-05-10 DIAGNOSIS — I10 Essential (primary) hypertension: Secondary | ICD-10-CM | POA: Diagnosis not present

## 2017-05-10 DIAGNOSIS — E785 Hyperlipidemia, unspecified: Secondary | ICD-10-CM | POA: Diagnosis not present

## 2017-05-17 DIAGNOSIS — Z23 Encounter for immunization: Secondary | ICD-10-CM | POA: Diagnosis not present

## 2017-05-17 DIAGNOSIS — I10 Essential (primary) hypertension: Secondary | ICD-10-CM | POA: Diagnosis not present

## 2017-05-17 DIAGNOSIS — E785 Hyperlipidemia, unspecified: Secondary | ICD-10-CM | POA: Diagnosis not present

## 2017-06-04 DIAGNOSIS — H2513 Age-related nuclear cataract, bilateral: Secondary | ICD-10-CM | POA: Diagnosis not present

## 2017-06-04 DIAGNOSIS — H35363 Drusen (degenerative) of macula, bilateral: Secondary | ICD-10-CM | POA: Diagnosis not present

## 2017-06-04 DIAGNOSIS — H524 Presbyopia: Secondary | ICD-10-CM | POA: Diagnosis not present

## 2017-06-04 DIAGNOSIS — H43392 Other vitreous opacities, left eye: Secondary | ICD-10-CM | POA: Diagnosis not present

## 2017-10-26 DIAGNOSIS — Z6827 Body mass index (BMI) 27.0-27.9, adult: Secondary | ICD-10-CM | POA: Diagnosis not present

## 2017-10-26 DIAGNOSIS — R05 Cough: Secondary | ICD-10-CM | POA: Diagnosis not present

## 2017-10-26 DIAGNOSIS — J329 Chronic sinusitis, unspecified: Secondary | ICD-10-CM | POA: Diagnosis not present

## 2017-11-14 DIAGNOSIS — Z6827 Body mass index (BMI) 27.0-27.9, adult: Secondary | ICD-10-CM | POA: Diagnosis not present

## 2017-11-14 DIAGNOSIS — K219 Gastro-esophageal reflux disease without esophagitis: Secondary | ICD-10-CM | POA: Diagnosis not present

## 2017-11-14 DIAGNOSIS — I10 Essential (primary) hypertension: Secondary | ICD-10-CM | POA: Diagnosis not present

## 2018-04-03 DIAGNOSIS — H43392 Other vitreous opacities, left eye: Secondary | ICD-10-CM | POA: Diagnosis not present

## 2018-04-03 DIAGNOSIS — H35363 Drusen (degenerative) of macula, bilateral: Secondary | ICD-10-CM | POA: Diagnosis not present

## 2018-04-03 DIAGNOSIS — H2513 Age-related nuclear cataract, bilateral: Secondary | ICD-10-CM | POA: Diagnosis not present

## 2018-04-03 DIAGNOSIS — H524 Presbyopia: Secondary | ICD-10-CM | POA: Diagnosis not present

## 2018-04-11 DIAGNOSIS — M545 Low back pain: Secondary | ICD-10-CM | POA: Diagnosis not present

## 2018-04-11 DIAGNOSIS — M9903 Segmental and somatic dysfunction of lumbar region: Secondary | ICD-10-CM | POA: Diagnosis not present

## 2018-04-11 DIAGNOSIS — M9902 Segmental and somatic dysfunction of thoracic region: Secondary | ICD-10-CM | POA: Diagnosis not present

## 2018-04-11 DIAGNOSIS — I10 Essential (primary) hypertension: Secondary | ICD-10-CM | POA: Diagnosis not present

## 2018-04-11 DIAGNOSIS — M9905 Segmental and somatic dysfunction of pelvic region: Secondary | ICD-10-CM | POA: Diagnosis not present

## 2018-04-22 DIAGNOSIS — K219 Gastro-esophageal reflux disease without esophagitis: Secondary | ICD-10-CM | POA: Diagnosis not present

## 2018-04-22 DIAGNOSIS — Z79899 Other long term (current) drug therapy: Secondary | ICD-10-CM | POA: Diagnosis not present

## 2018-04-22 DIAGNOSIS — Z125 Encounter for screening for malignant neoplasm of prostate: Secondary | ICD-10-CM | POA: Diagnosis not present

## 2018-04-22 DIAGNOSIS — I10 Essential (primary) hypertension: Secondary | ICD-10-CM | POA: Diagnosis not present

## 2018-04-29 ENCOUNTER — Ambulatory Visit (HOSPITAL_COMMUNITY)
Admission: RE | Admit: 2018-04-29 | Discharge: 2018-04-29 | Disposition: A | Discharge status: Home / Self Care | Payer: Medicare Other | Source: Ambulatory Visit | Attending: Internal Medicine | Admitting: Internal Medicine

## 2018-04-29 ENCOUNTER — Other Ambulatory Visit (HOSPITAL_COMMUNITY): Payer: Self-pay | Admitting: Internal Medicine

## 2018-04-29 DIAGNOSIS — M545 Low back pain: Secondary | ICD-10-CM | POA: Insufficient documentation

## 2018-04-29 DIAGNOSIS — Z6828 Body mass index (BMI) 28.0-28.9, adult: Secondary | ICD-10-CM | POA: Diagnosis not present

## 2018-04-29 DIAGNOSIS — M2578 Osteophyte, vertebrae: Secondary | ICD-10-CM | POA: Diagnosis not present

## 2018-04-29 DIAGNOSIS — I10 Essential (primary) hypertension: Secondary | ICD-10-CM | POA: Diagnosis not present

## 2018-04-29 DIAGNOSIS — E785 Hyperlipidemia, unspecified: Secondary | ICD-10-CM | POA: Diagnosis not present

## 2018-04-29 DIAGNOSIS — Z23 Encounter for immunization: Secondary | ICD-10-CM | POA: Diagnosis not present

## 2018-07-30 ENCOUNTER — Encounter (INDEPENDENT_AMBULATORY_CARE_PROVIDER_SITE_OTHER): Payer: Self-pay | Admitting: *Deleted

## 2018-08-05 ENCOUNTER — Encounter (INDEPENDENT_AMBULATORY_CARE_PROVIDER_SITE_OTHER): Payer: Self-pay | Admitting: *Deleted

## 2018-08-05 NOTE — Telephone Encounter (Signed)
This encounter was created in error - please disregard.

## 2018-08-05 NOTE — Telephone Encounter (Addendum)
error 

## 2018-08-07 ENCOUNTER — Other Ambulatory Visit (INDEPENDENT_AMBULATORY_CARE_PROVIDER_SITE_OTHER): Payer: Self-pay | Admitting: *Deleted

## 2018-08-07 DIAGNOSIS — Z860100 Personal history of colon polyps, unspecified: Secondary | ICD-10-CM | POA: Insufficient documentation

## 2018-08-07 DIAGNOSIS — Z800 Family history of malignant neoplasm of digestive organs: Secondary | ICD-10-CM

## 2018-09-02 ENCOUNTER — Encounter (INDEPENDENT_AMBULATORY_CARE_PROVIDER_SITE_OTHER): Payer: Self-pay | Admitting: *Deleted

## 2018-09-02 ENCOUNTER — Telehealth (INDEPENDENT_AMBULATORY_CARE_PROVIDER_SITE_OTHER): Payer: Self-pay | Admitting: *Deleted

## 2018-09-02 NOTE — Telephone Encounter (Signed)
Patient needs trilyte 

## 2018-09-05 MED ORDER — PEG 3350-KCL-NA BICARB-NACL 420 G PO SOLR: 4000.0000 mL | Freq: Once | ORAL | 0 refills | Status: AC

## 2018-09-18 ENCOUNTER — Telehealth (INDEPENDENT_AMBULATORY_CARE_PROVIDER_SITE_OTHER): Payer: Self-pay | Admitting: *Deleted

## 2018-09-18 NOTE — Telephone Encounter (Signed)
agree

## 2018-09-18 NOTE — Telephone Encounter (Signed)
Referring MD/PCP: fagan   Procedure: tcs  Reason/Indication:  Hx polyps, fam hx colon ca  Has patient had this procedure before?  Yes, 2014  If so, when, by whom and where?    Is there a family history of colon cancer?  Yes, brother  Who?  What age when diagnosed?    Is patient diabetic?   no      Does patient have prosthetic heart valve or mechanical valve?  no  Do you have a pacemaker?  no  Has patient ever had endocarditis? no  Has patient had joint replacement within last 12 months?  no  Is patient constipated or do they take laxatives? no  Does patient have a history of alcohol/drug use?  no  Is patient on blood thinner such as Coumadin, Plavix and/or Aspirin? o  Medications: atorvastatin 20 mg daily, omeprazole 20 mg daily, benazepril 10 mg daily, amlodipine 5 mg daily  Allergies: pcn, pantoprazole  Medication Adjustment per Dr Lindi Adie, NP:   Procedure date & time: 10/17/18 at 930

## 2018-10-08 ENCOUNTER — Encounter (INDEPENDENT_AMBULATORY_CARE_PROVIDER_SITE_OTHER): Payer: Self-pay | Admitting: *Deleted

## 2018-10-29 ENCOUNTER — Other Ambulatory Visit: Payer: Self-pay | Admitting: Podiatry

## 2018-10-29 ENCOUNTER — Encounter: Payer: Self-pay | Admitting: Sports Medicine

## 2018-10-29 ENCOUNTER — Ambulatory Visit (INDEPENDENT_AMBULATORY_CARE_PROVIDER_SITE_OTHER): Payer: Medicare Other

## 2018-10-29 ENCOUNTER — Other Ambulatory Visit: Payer: Self-pay | Admitting: Sports Medicine

## 2018-10-29 ENCOUNTER — Ambulatory Visit (INDEPENDENT_AMBULATORY_CARE_PROVIDER_SITE_OTHER): Payer: Medicare Other | Admitting: Sports Medicine

## 2018-10-29 ENCOUNTER — Other Ambulatory Visit: Payer: Self-pay

## 2018-10-29 VITALS — BP 147/74 | HR 64 | Temp 97.8°F | Resp 16

## 2018-10-29 DIAGNOSIS — M7661 Achilles tendinitis, right leg: Secondary | ICD-10-CM

## 2018-10-29 DIAGNOSIS — M79671 Pain in right foot: Secondary | ICD-10-CM

## 2018-10-29 DIAGNOSIS — M9261 Juvenile osteochondrosis of tarsus, right ankle: Secondary | ICD-10-CM | POA: Diagnosis not present

## 2018-10-29 DIAGNOSIS — M7750 Other enthesopathy of unspecified foot: Secondary | ICD-10-CM

## 2018-10-29 MED ORDER — METHYLPREDNISOLONE 4 MG PO TBPK: ORAL_TABLET | ORAL | 0 refills | Status: DC

## 2018-10-29 NOTE — Progress Notes (Signed)
Subjective: David Weiss is a 73 y.o. male patient who presents to office for evaluation of Right heel pain. Patient complains of progressive pain especially over the last 10 days in the Right foot at the Achilles. Ranks pain 4/10 and is now interferring with daily activities. Patient has tried motrin with a little relief in symptoms. Patient denies any other pedal complaints.   Review of Systems  Musculoskeletal: Positive for myalgias.  All other systems reviewed and are negative.    Patient Active Problem List   Diagnosis Date Noted  . Hx of colonic polyps 08/07/2018  . Barrett's esophagus without dysplasia 05/10/2016  . Incisional hernia 02/08/2013  . GIST (gastrointestinal stromal tumor) of small bowel, malignant ([place]) 07/04/2011  . Status post small bowel resection 06/22/2011  . Abdominal mass 06/19/2011  . Essential hypertension 06/19/2011  . GI bleeding 06/17/2011  . Acute blood loss anemia 06/17/2011  . Syncope 06/17/2011  . Iron deficiency anemia 04/19/2011  . Schatzki's ring 01/03/2011  . Colon adenoma 01/03/2011  . Family hx of colon cancer 01/03/2011  . HELICOBACTER PYLORI GASTRITIS 12/22/2009  . BARRETTS ESOPHAGUS 12/22/2009    Current Outpatient Medications on File Prior to Visit  Medication Sig Dispense Refill  . Ascorbic Acid (VITAMIN C PO) Take by mouth.    . Cholecalciferol (D3 VITAMIN PO) Take by mouth.    . Multiple Vitamin (MULTIVITAMIN) capsule Take 1 capsule by mouth daily.    . Omega-3 Fatty Acids (OMEGA-3 FISH OIL PO) Take by mouth.    Marland Kitchen amLODipine (NORVASC) 5 MG tablet Take 5 mg by mouth daily.      Marland Kitchen atorvastatin (LIPITOR) 10 MG tablet Take 10 mg by mouth daily.      . benazepril (LOTENSIN) 10 MG tablet Take 10 mg by mouth daily.      . Methylcellulose, Laxative, (FIBER THERAPY PO) Take 1 tablet by mouth daily.    Marland Kitchen omeprazole (PRILOSEC) 20 MG capsule Take 20 mg by mouth daily.     No current facility-administered medications on file prior to  visit.    Allergies: See Chart  Objective:  General: Alert and oriented x3 in no acute distress  Dermatology: No open lesions bilateral lower extremities, no webspace macerations, no ecchymosis bilateral, all nails x 10 are well manicured.  Vascular: Dorsalis Pedis and Posterior Tibial pedal pulses 1/4, Capillary Fill Time 3 seconds, + pedal hair growth bilateral, no edema bilateral lower extremities, Temperature gradient within normal limits.  Neurology: Johney Maine sensation intact via light touch bilateral.   Musculoskeletal: Mild tenderness with palpation at insertion of the Achilles on Right just above the exostosis, there is calcaneal exostosis with mild soft tissue present and decreased ankle rom with knee extending  vs flexed resembling gastroc equnius bilateral, The achilles tendon feels intact with no nodularity or palpable dell, Thompson sign negative, Subtalar and midtarsal joint range of motion is within normal limits, there is no 1st ray hypermobility or forefoot deformity noted bilateral.   Gait: Antalgic gait with increased heel off right>left.   Xrays  Right Foot    Impression: Normal osseous mineralization. Joint spaces preserved. No fracture/dislocation/boney destruction. Calcaneal spur present. Kager's triangle intact with no obliteration. No soft tissue abnormalities or radiopaque foreign bodies.   Assessment and Plan: Problem List Items Addressed This Visit    None    Visit Diagnoses    Achilles tendinitis of right lower extremity    -  Primary   Pain of right heel  Haglund's deformity, right         -Complete examination performed -Xrays reviewed -Rx Medrol, heel lifts, gentle stretching -No improvement will consider MRI/PT/EPAT -Patient to return to office as needed or sooner if condition worsens.  Landis Martins, DPM

## 2018-10-29 NOTE — Patient Instructions (Signed)

## 2018-11-13 DIAGNOSIS — I10 Essential (primary) hypertension: Secondary | ICD-10-CM | POA: Diagnosis not present

## 2018-11-13 DIAGNOSIS — G4700 Insomnia, unspecified: Secondary | ICD-10-CM | POA: Diagnosis not present

## 2018-11-18 ENCOUNTER — Other Ambulatory Visit: Payer: Self-pay | Admitting: Sports Medicine

## 2018-11-18 ENCOUNTER — Telehealth: Payer: Self-pay | Admitting: Sports Medicine

## 2018-11-18 MED ORDER — METHYLPREDNISOLONE 4 MG PO TBPK: ORAL_TABLET | ORAL | 0 refills | Status: DC

## 2018-11-18 NOTE — Telephone Encounter (Signed)
Refill sent -Dr. Cannon Kettle

## 2018-11-18 NOTE — Progress Notes (Signed)
Refilled medrol dose pak -Dr. Cannon Kettle

## 2018-11-18 NOTE — Telephone Encounter (Signed)
Dr Cannon Kettle please advise patient is requesting a refill on the Medrol dose pack

## 2018-11-18 NOTE — Telephone Encounter (Signed)
Pt was seen on 3/31 for tendinitis and given medication to help with his pain and the medication seemed to help but he has run out. Pt calling to request a refill on the prescription.

## 2018-11-19 NOTE — Telephone Encounter (Signed)
Called an notified patient that the refill has been called into his pharmacy.  Explained that if symptoms continue we would like him to follow up in office patient stated understanding.

## 2018-12-18 DIAGNOSIS — Z800 Family history of malignant neoplasm of digestive organs: Secondary | ICD-10-CM

## 2018-12-18 DIAGNOSIS — Z86010 Personal history of colonic polyps: Principal | ICD-10-CM

## 2018-12-30 ENCOUNTER — Encounter (INDEPENDENT_AMBULATORY_CARE_PROVIDER_SITE_OTHER): Payer: Self-pay | Admitting: *Deleted

## 2019-01-29 ENCOUNTER — Telehealth (INDEPENDENT_AMBULATORY_CARE_PROVIDER_SITE_OTHER): Payer: Self-pay | Admitting: *Deleted

## 2019-01-29 NOTE — Telephone Encounter (Signed)
Referring MD/PCP: fagan   Procedure: tcs  Reason/Indication:  Hx polyps, fam hx colon ca  Has patient had this procedure before?  Yes, 2014             If so, when, by whom and where?    Is there a family history of colon cancer?  Yes, brother             Who?  What age when diagnosed?    Is patient diabetic?   no                                                  Does patient have prosthetic heart valve or mechanical valve?  no  Do you have a pacemaker?  no  Has patient ever had endocarditis? no  Has patient had joint replacement within last 12 months?  no  Is patient constipated or do they take laxatives? no  Does patient have a history of alcohol/drug use?  no  Is patient on blood thinner such as Coumadin, Plavix and/or Aspirin? o  Medications: atorvastatin 20 mg daily, omeprazole 20 mg daily, benazepril 10 mg daily, amlodipine 5 mg daily  Allergies: pcn, pantoprazole  Medication Adjustment per Dr Lindi Adie, NP:   Procedure date & time: 02/26/19 at 1200

## 2019-01-29 NOTE — Telephone Encounter (Signed)
agree

## 2019-02-03 ENCOUNTER — Other Ambulatory Visit: Payer: Self-pay | Admitting: Internal Medicine

## 2019-02-03 ENCOUNTER — Other Ambulatory Visit: Payer: Medicare Other

## 2019-02-03 DIAGNOSIS — R6889 Other general symptoms and signs: Secondary | ICD-10-CM | POA: Diagnosis not present

## 2019-02-03 DIAGNOSIS — Z20822 Contact with and (suspected) exposure to covid-19: Secondary | ICD-10-CM

## 2019-02-04 ENCOUNTER — Other Ambulatory Visit: Payer: Self-pay

## 2019-02-04 ENCOUNTER — Encounter: Payer: Self-pay | Admitting: Sports Medicine

## 2019-02-04 ENCOUNTER — Ambulatory Visit (INDEPENDENT_AMBULATORY_CARE_PROVIDER_SITE_OTHER): Payer: Medicare Other | Admitting: Sports Medicine

## 2019-02-04 VITALS — Temp 98.0°F

## 2019-02-04 DIAGNOSIS — M79671 Pain in right foot: Secondary | ICD-10-CM

## 2019-02-04 DIAGNOSIS — M9261 Juvenile osteochondrosis of tarsus, right ankle: Secondary | ICD-10-CM

## 2019-02-04 DIAGNOSIS — M7661 Achilles tendinitis, right leg: Secondary | ICD-10-CM | POA: Diagnosis not present

## 2019-02-04 NOTE — Patient Instructions (Signed)
For tennis shoes recommend:  Kandy Garrison [employer] New balance [place] HOKA Can be purchased at [employer] sports or [employer]  Vionic  SAS Can be purchased at [employer] or [employer]   For work shoes recommend: [employer] Work [employer]  Can be purchased at a variety of places or Engineer, maintenance ([employer])   For casual shoes recommend: Vionic  Can be purchased at [employer] or [employer]

## 2019-02-04 NOTE — Progress Notes (Signed)
Subjective: David Weiss is a 73 y.o. male patient who returns to office for evaluation of Right heel pain. Patient admits that he still has pain with direct pressure and certain activities at the Achilles.  Patient reports that pain is no better or no worse about 4 out of 10.  Patient reports that he did feel some relief with the steroid by mouth and that the refill did help him.  Patient still struggles with certain activities and has some pain or discomfort after walking for exercise and with certain shoes.  Patient reports that he is currently using a sandal that he loosens the strap on so it does not rub the back of his heel.  Patient denies any changes since last visit with medical history.  Patient denies any other pedal complaints.   Patient Active Problem List   Diagnosis Date Noted  . Hx of colonic polyps 08/07/2018  . Barrett's esophagus without dysplasia 05/10/2016  . Incisional hernia 02/08/2013  . GIST (gastrointestinal stromal tumor) of small bowel, malignant ([place]) 07/04/2011  . Status post small bowel resection 06/22/2011  . Abdominal mass 06/19/2011  . Essential hypertension 06/19/2011  . GI bleeding 06/17/2011  . Acute blood loss anemia 06/17/2011  . Syncope 06/17/2011  . Iron deficiency anemia 04/19/2011  . Schatzki's ring 01/03/2011  . Colon adenoma 01/03/2011  . Family hx of colon cancer 01/03/2011  . HELICOBACTER PYLORI GASTRITIS 12/22/2009  . BARRETTS ESOPHAGUS 12/22/2009    Current Outpatient Medications on File Prior to Visit  Medication Sig Dispense Refill  . amLODipine (NORVASC) 5 MG tablet Take 5 mg by mouth daily.      . Ascorbic Acid (VITAMIN C PO) Take by mouth.    Marland Kitchen atorvastatin (LIPITOR) 10 MG tablet Take 10 mg by mouth daily.      . benazepril (LOTENSIN) 10 MG tablet Take 10 mg by mouth daily.      . Cholecalciferol (D3 VITAMIN PO) Take by mouth.    . Methylcellulose, Laxative, (FIBER THERAPY PO) Take 1 tablet by mouth daily.    . methylPREDNISolone  (MEDROL DOSEPAK) 4 MG TBPK tablet Take as directed 21 tablet 0  . Multiple Vitamin (MULTIVITAMIN) capsule Take 1 capsule by mouth daily.    . Omega-3 Fatty Acids (OMEGA-3 FISH OIL PO) Take by mouth.    Marland Kitchen omeprazole (PRILOSEC) 20 MG capsule Take 20 mg by mouth daily.    Marland Kitchen zolpidem (AMBIEN) 10 MG tablet      No current facility-administered medications on file prior to visit.    Allergies: See Chart  Objective:  General: Alert and oriented x3 in no acute distress  Dermatology: No open lesions bilateral lower extremities, no webspace macerations, no ecchymosis bilateral, all nails x 10 are well manicured.  Vascular: Dorsalis Pedis and Posterior Tibial pedal pulses 1/4, Capillary Fill Time 3 seconds, + pedal hair growth bilateral, no edema bilateral lower extremities, Temperature gradient within normal limits.  Neurology: Johney Maine sensation intact via light touch bilateral.   Musculoskeletal: Mild tenderness with palpation at insertion of the Achilles on Right just above the exostosis and into the watershed area, there is calcaneal exostosis with mild soft tissue present and decreased ankle rom with knee extending  vs flexed resembling gastroc equnius bilateral, The achilles tendon feels intact with no nodularity or palpable dell, Thompson sign negative, Subtalar and midtarsal joint range of motion is within normal limits, there is no 1st ray hypermobility or forefoot deformity noted bilateral. .   Assessment and Plan:  Problem List Items Addressed This Visit    None    Visit Diagnoses    Achilles tendinitis of right lower extremity    -  Primary   Pain of right heel       Haglund's deformity, right         -Complete examination performed -Discussed continued care for right Achilles tendinitis with heel spur -Prescribed night splint and advised patient to be consistent with icing especially over the next week or 2 or when he is most active to help with decreasing pain and over pulling at the  Achilles -Advised patient if his pain continues may benefit from an MRI however since patient is refusing any surgery would not want any type of MRI at this time and advised patient that if he continues to have pain may benefit from physical therapy; advised patient to call the office and we can send a prescription if he decides he wants to proceed with therapy -Patient to return to office as needed or sooner if condition worsens.  Landis Martins, DPM

## 2019-02-08 LAB — NOVEL CORONAVIRUS, NAA: SARS-CoV-2, NAA: NOT DETECTED

## 2019-02-24 ENCOUNTER — Other Ambulatory Visit: Payer: Self-pay

## 2019-02-24 ENCOUNTER — Other Ambulatory Visit (HOSPITAL_COMMUNITY)
Admission: RE | Admit: 2019-02-24 | Discharge: 2019-02-24 | Disposition: A | Discharge status: Home / Self Care | Payer: Medicare Other | Source: Ambulatory Visit | Attending: Internal Medicine | Admitting: Internal Medicine

## 2019-02-24 DIAGNOSIS — Z20828 Contact with and (suspected) exposure to other viral communicable diseases: Secondary | ICD-10-CM | POA: Diagnosis not present

## 2019-02-24 LAB — SARS CORONAVIRUS 2 (TAT 6-24 HRS): SARS Coronavirus 2: NEGATIVE

## 2019-02-26 ENCOUNTER — Other Ambulatory Visit: Payer: Self-pay

## 2019-02-26 ENCOUNTER — Ambulatory Visit (HOSPITAL_COMMUNITY)
Admission: RE | Admit: 2019-02-26 | Discharge: 2019-02-26 | Disposition: A | Discharge status: Home / Self Care | Payer: Medicare Other | Attending: Internal Medicine | Admitting: Internal Medicine

## 2019-02-26 ENCOUNTER — Encounter (HOSPITAL_COMMUNITY): Payer: Self-pay

## 2019-02-26 ENCOUNTER — Encounter (HOSPITAL_COMMUNITY)
Admission: RE | Disposition: A | Discharge status: Home / Self Care | Payer: Self-pay | Source: Home / Self Care | Attending: Internal Medicine

## 2019-02-26 DIAGNOSIS — D120 Benign neoplasm of cecum: Secondary | ICD-10-CM | POA: Diagnosis not present

## 2019-02-26 DIAGNOSIS — Z860100 Personal history of colon polyps, unspecified: Secondary | ICD-10-CM | POA: Insufficient documentation

## 2019-02-26 DIAGNOSIS — D122 Benign neoplasm of ascending colon: Secondary | ICD-10-CM | POA: Diagnosis not present

## 2019-02-26 DIAGNOSIS — K219 Gastro-esophageal reflux disease without esophagitis: Secondary | ICD-10-CM | POA: Diagnosis not present

## 2019-02-26 DIAGNOSIS — Z79899 Other long term (current) drug therapy: Secondary | ICD-10-CM | POA: Diagnosis not present

## 2019-02-26 DIAGNOSIS — Z800 Family history of malignant neoplasm of digestive organs: Secondary | ICD-10-CM | POA: Diagnosis not present

## 2019-02-26 DIAGNOSIS — E785 Hyperlipidemia, unspecified: Secondary | ICD-10-CM | POA: Diagnosis not present

## 2019-02-26 DIAGNOSIS — I10 Essential (primary) hypertension: Secondary | ICD-10-CM | POA: Diagnosis not present

## 2019-02-26 DIAGNOSIS — Z87891 Personal history of nicotine dependence: Secondary | ICD-10-CM | POA: Insufficient documentation

## 2019-02-26 DIAGNOSIS — K5730 Diverticulosis of large intestine without perforation or abscess without bleeding: Secondary | ICD-10-CM | POA: Insufficient documentation

## 2019-02-26 DIAGNOSIS — D123 Benign neoplasm of transverse colon: Secondary | ICD-10-CM | POA: Insufficient documentation

## 2019-02-26 DIAGNOSIS — Z1211 Encounter for screening for malignant neoplasm of colon: Secondary | ICD-10-CM | POA: Diagnosis not present

## 2019-02-26 DIAGNOSIS — Z09 Encounter for follow-up examination after completed treatment for conditions other than malignant neoplasm: Secondary | ICD-10-CM | POA: Diagnosis not present

## 2019-02-26 HISTORY — PX: POLYPECTOMY: SHX5525

## 2019-02-26 HISTORY — PX: COLONOSCOPY: SHX5424

## 2019-02-26 SURGERY — COLONOSCOPY
Anesthesia: Moderate Sedation

## 2019-02-26 MED ORDER — STERILE WATER FOR IRRIGATION IR SOLN
Status: DC | PRN
  Administered 2019-02-26: 11:00:00 2.5 mL

## 2019-02-26 MED ORDER — MIDAZOLAM HCL 5 MG/5ML IJ SOLN
INTRAMUSCULAR | Status: DC | PRN
  Administered 2019-02-26 (×2): 2 mg via INTRAVENOUS
  Administered 2019-02-26: 1 mg via INTRAVENOUS
  Administered 2019-02-26: 2 mg via INTRAVENOUS

## 2019-02-26 MED ORDER — MEPERIDINE HCL 50 MG/ML IJ SOLN
INTRAMUSCULAR | Status: AC
  Filled 2019-02-26: qty 1

## 2019-02-26 MED ORDER — MIDAZOLAM HCL 5 MG/5ML IJ SOLN
INTRAMUSCULAR | Status: AC
  Filled 2019-02-26: qty 10

## 2019-02-26 MED ORDER — SODIUM CHLORIDE 0.9 % IV SOLN
INTRAVENOUS | Status: DC
  Administered 2019-02-26: 11:00:00 via INTRAVENOUS

## 2019-02-26 MED ORDER — MEPERIDINE HCL 50 MG/ML IJ SOLN
INTRAMUSCULAR | Status: DC | PRN
  Administered 2019-02-26 (×2): 25 mg via INTRAVENOUS

## 2019-02-26 NOTE — H&P (Signed)
David Weiss is an 73 y.o. male.   Chief Complaint: Patient is here for colonoscopy HPI: Patient 73 year old Caucasian male who has history of colonic adenomas and family history of CRC who is here for surveillance colonoscopy.  While he has had multiple tubular adenomas in the past on his last colonoscopy of January 2015 he had 6 mm tubular adenoma removed.  He denies abdominal pain change in bowel habits or rectal bleeding. History significant for CRC in brother who was 66 at the time of diagnosis and is doing fine 12 years later.  Past Medical History:  Diagnosis Date  . Anemia   . Barrett's esophagus 5/11   EGD Dr. Phillips Climes Barrett's, multiple antral/bulbar erosins, D2 erosion sec NSAIDs  . Diverticulosis 5/11   Colonoscopy Dr Gala Romney  . Family hx of colon cancer   . GERD (gastroesophageal reflux disease)   . Helicobacter pylori gastritis 5/11  . HTN (hypertension)   . Hx of adenomatous colonic polyps 8/10   Colonoscopy Dr. Gala Romney  . Hyperlipemia   . Schatzki's ring 5/11   5F dilator    Past Surgical History:  Procedure Laterality Date  . BIOPSY N/A 08/30/2016   Procedure: BIOPSY;  Surgeon: Rogene Houston, MD;  [facility]: [facility];  Service: Endoscopy;  Laterality: N/A;  esophageal  . CAPSULE ENDOSCOPY OF THE SMALL BOWEL  02/17/2011      . COLONOSCOPY  05/19/2011   Procedure: COLONOSCOPY;  Surgeon: Daneil Dolin, MD;  Location: [facility];  Service: Endoscopy;  Laterality: N/A;  8:30  . COLONOSCOPY N/A 08/21/2013   Procedure: COLONOSCOPY;  Surgeon: Rogene Houston, MD;  Location: [facility];  Service: Endoscopy;  Laterality: N/A;  930  . EAR CYST EXCISION N/A 03/25/2014   Procedure: EXCISION OF NEOPLASM NECK;  Surgeon: Jamesetta So, MD;  Location: [facility];  Service: General;  Laterality: N/A;  . ESOPHAGOGASTRODUODENOSCOPY  02/13/2011   Procedure: ESOPHAGOGASTRODUODENOSCOPY (EGD);  Surgeon: Daneil Dolin, MD;  [facility];  Service: Endoscopy;   Laterality: N/A;  . ESOPHAGOGASTRODUODENOSCOPY N/A 08/30/2016   Procedure: ESOPHAGOGASTRODUODENOSCOPY (EGD);  Surgeon: Rogene Houston, MD;  [facility];  Service: Endoscopy;  Laterality: N/A;  730  . excisional hernia  01/2013  . GIVENS CAPSULE STUDY  02/15/2011   Procedure: GIVENS CAPSULE STUDY;  Surgeon: Daneil Dolin, MD;  [facility]: [facility];  Service: Endoscopy;  Laterality: N/A;  . small intestine partial removal  06/2011   due to malignant tumor  . VASECTOMY      Family History  Problem Relation Age of Onset  . Diabetes Mother   . Heart failure Mother   . Prostate cancer Father 69  . Colon cancer Brother 1   Social History:  reports that he quit smoking about 37 years ago. His smoking use included cigarettes. He has a 45.00 pack-year smoking history. He has never used smokeless tobacco. He reports current alcohol use. He reports that he does not use drugs.  Allergies:  Allergies  Allergen Reactions  . Penicillins Itching    Did it involve swelling of the face/tongue/throat, SOB, or low BP? Unknown Did it involve sudden or severe rash/hives, skin peeling, or any reaction on the inside of your mouth or nose? Unknown Did you need to seek medical attention at a hospital or doctor's office? Unknown When did it last happen?childhood reaction  If all above answers are "NO", may proceed with cephalosporin use.   . Pantoprazole Sodium  Itching and Rash    Medications Prior to Admission  Medication Sig Dispense Refill  . amLODipine (NORVASC) 5 MG tablet Take 5 mg by mouth daily.      Marland Kitchen atorvastatin (LIPITOR) 10 MG tablet Take 10 mg by mouth daily.      . benazepril (LOTENSIN) 10 MG tablet Take 10 mg by mouth daily.      . Methylcellulose, Laxative, (FIBER THERAPY PO) Take 1 tablet by mouth daily.    . Multiple Vitamin (MULTIVITAMIN WITH MINERALS) TABS tablet Take 1 tablet by mouth daily.    . Omega-3 Fatty Acids (OMEGA-3 FISH OIL PO) Take 500 mg by mouth daily.      Marland Kitchen omeprazole (PRILOSEC) 20 MG capsule Take 20 mg by mouth daily.    Marland Kitchen zolpidem (AMBIEN) 10 MG tablet Take 10 mg by mouth at bedtime as needed for sleep.     . Cholecalciferol (D3 VITAMIN PO) Take 5,000 Units by mouth daily.     . methylPREDNISolone (MEDROL DOSEPAK) 4 MG TBPK tablet Take as directed (Patient not taking: Reported on 02/19/2019) 21 tablet 0    No results found for this or any previous visit (from the past 48 hour(s)). No results found.  ROS  Blood pressure (!) 148/68, pulse (!) 55, temperature 97.9 F (36.6 C), temperature source Oral, resp. rate 17, height 5\' 8"  (1.727 m), weight 81.6 kg, SpO2 95 %. Physical Exam  Constitutional: He appears well-developed and well-nourished.  HENT:  Mouth/Throat: Oropharynx is clear and moist.  Eyes: Conjunctivae are normal. No scleral icterus.  Neck: No thyromegaly present.  Cardiovascular: Normal rate, regular rhythm and normal heart sounds.  No murmur heard. Respiratory: Effort normal and breath sounds normal.  GI: Soft. He exhibits no distension and no mass. There is no abdominal tenderness.  He has upper midline scar.  Lymphadenopathy:    He has no cervical adenopathy.     Assessment/Plan History of colonic adenomas. Family history of CRC. Surveillance colonoscopy.  Hildred Laser, MD 02/26/2019, 10:57 AM

## 2019-02-26 NOTE — Discharge Instructions (Signed)
No aspirin or NSAIDs for 24 hours. °Resume usual medications as before. °High-fiber diet. °No driving for 24 hours. °Physician will call with biopsy results. ° ° °Colonoscopy, Adult, Care After °This sheet gives you information about how to care for yourself after your procedure. Your doctor may also give you more specific instructions. If you have problems or questions, call your doctor. °What can I expect after the procedure? °After the procedure, it is common to have: °· A small amount of blood in your poop for 24 hours. °· Some gas. °· Mild cramping or bloating in your belly. °Follow these instructions at home: °General instructions °· For the first 24 hours after the procedure: °? Do not drive or use machinery. °? Do not sign important documents. °? Do not drink alcohol. °? Do your daily activities more slowly than normal. °? Eat foods that are soft and easy to digest. °· Take over-the-counter or prescription medicines only as told by your doctor. °To help cramping and bloating: ° °· Try walking around. °· Put heat on your belly (abdomen) as told by your doctor. Use a heat source that your doctor recommends, such as a moist heat pack or a heating pad. °? Put a towel between your skin and the heat source. °? Leave the heat on for 20-30 minutes. °? Remove the heat if your skin turns bright red. This is especially important if you cannot feel pain, heat, or cold. You can get burned. °Eating and drinking ° °· Drink enough fluid to keep your pee (urine) clear or pale yellow. °· Return to your normal diet as told by your doctor. Avoid heavy or fried foods that are hard to digest. °· Avoid drinking alcohol for as long as told by your doctor. °Contact a doctor if: °· You have blood in your poop (stool) 2-3 days after the procedure. °Get help right away if: °· You have more than a small amount of blood in your poop. °· You see large clumps of tissue (blood clots) in your poop. °· Your belly is swollen. °· You feel sick  to your stomach (nauseous). °· You throw up (vomit). °· You have a fever. °· You have belly pain that gets worse, and medicine does not help your pain. °Summary °· After the procedure, it is common to have a small amount of blood in your poop. You may also have mild cramping and bloating in your belly. °· For the first 24 hours after the procedure, do not drive or use machinery, do not sign important documents, and do not drink alcohol. °· Get help right away if you have a lot of blood in your poop, feel sick to your stomach, have a fever, or have more belly pain. °This information is not intended to replace advice given to you by your health care provider. Make sure you discuss any questions you have with your health care provider. °Document Released: 08/19/2010 Document Revised: 05/17/2017 Document Reviewed: 04/10/2016 °Elsevier Patient Education © 2020 [employer] ° °Diverticulosis ° °Diverticulosis is a condition that develops when small pouches (diverticula) form in the wall of the large intestine (colon). The colon is where water is absorbed and stool is formed. The pouches form when the inside layer of the colon pushes through weak spots in the outer layers of the colon. You may have a few pouches or many of them. °What are the causes? °The cause of this condition is not known. °What increases the risk? °The following factors may make you   more likely to develop this condition: °· Being older than age 60. Your risk for this condition increases with age. Diverticulosis is rare among people younger than age 30. By age 80, many people have it. °· Eating a low-fiber diet. °· Having frequent constipation. °· Being overweight. °· Not getting enough exercise. °· Smoking. °· Taking over-the-counter pain medicines, like aspirin and ibuprofen. °· Having a family history of diverticulosis. °What are the signs or symptoms? °In most people, there are no symptoms of this condition. If you do have symptoms, they may  include: °· Bloating. °· Cramps in the abdomen. °· Constipation or diarrhea. °· Pain in the lower left side of the abdomen. °How is this diagnosed? °This condition is most often diagnosed during an exam for other colon problems. Because diverticulosis usually has no symptoms, it often cannot be diagnosed independently. This condition may be diagnosed by: °· Using a flexible scope to examine the colon (colonoscopy). °· Taking an X-ray of the colon after dye has been put into the colon (barium enema). °· Doing a CT scan. °How is this treated? °You may not need treatment for this condition if you have never developed an infection related to diverticulosis. If you have had an infection before, treatment may include: °· Eating a high-fiber diet. This may include eating more fruits, vegetables, and grains. °· Taking a fiber supplement. °· Taking a live bacteria supplement (probiotic). °· Taking medicine to relax your colon. °· Taking antibiotic medicines. °Follow these instructions at home: °· Drink 6-8 glasses of water or more each day to prevent constipation. °· Try not to strain when you have a bowel movement. °· If you have had an infection before: °? Eat more fiber as directed by your health care provider or your diet and nutrition specialist (dietitian). °? Take a fiber supplement or probiotic, if your health care provider approves. °· Take over-the-counter and prescription medicines only as told by your health care provider. °· If you were prescribed an antibiotic, take it as told by your health care provider. Do not stop taking the antibiotic even if you start to feel better. °· Keep all follow-up visits as told by your health care provider. This is important. °Contact a health care provider if: °· You have pain in your abdomen. °· You have bloating. °· You have cramps. °· You have not had a bowel movement in 3 days. °Get help right away if: °· Your pain gets worse. °· Your bloating becomes very bad. °· You have a  fever or chills, and your symptoms suddenly get worse. °· You vomit. °· You have bowel movements that are bloody or black. °· You have bleeding from your rectum. °Summary °· Diverticulosis is a condition that develops when small pouches (diverticula) form in the wall of the large intestine (colon). °· You may have a few pouches or many of them. °· This condition is most often diagnosed during an exam for other colon problems. °· If you have had an infection related to diverticulosis, treatment may include increasing the fiber in your diet, taking supplements, or taking medicines. °This information is not intended to replace advice given to you by your health care provider. Make sure you discuss any questions you have with your health care provider. °Document Released: 04/13/2004 Document Revised: 06/29/2017 Document Reviewed: 06/05/2016 °Elsevier Patient Education © 2020 [employer] ° °Colon Polyps ° °Polyps are tissue growths inside the body. Polyps can grow in many places, including the large intestine (colon). A polyp   may be a round bump or a mushroom-shaped growth. You could have one polyp or several. °Most colon polyps are noncancerous (benign). However, some colon polyps can become cancerous over time. Finding and removing the polyps early can help prevent this. °What are the causes? °The exact cause of colon polyps is not known. °What increases the risk? °You are more likely to develop this condition if you: °· Have a family history of colon cancer or colon polyps. °· Are older than 50 or older than 45 if you are African American. °· Have inflammatory bowel disease, such as ulcerative colitis or Crohn's disease. °· Have certain hereditary conditions, such as: °? Familial adenomatous polyposis. °? Lynch syndrome. °? Turcot syndrome. °? Peutz-Jeghers syndrome. °· Are overweight. °· Smoke cigarettes. °· Do not get enough exercise. °· Drink too much alcohol. °· Eat a diet that is high in fat and red meat and  low in fiber. °· Had childhood cancer that was treated with abdominal radiation. °What are the signs or symptoms? °Most polyps do not cause symptoms. °If you have symptoms, they may include: °· Blood coming from your rectum when having a bowel movement. °· Blood in your stool. The stool may look dark red or black. °· Abdominal pain. °· A change in bowel habits, such as constipation or diarrhea. °How is this diagnosed? °This condition is diagnosed with a colonoscopy. This is a procedure in which a lighted, flexible scope is inserted into the anus and then passed into the colon to examine the area. Polyps are sometimes found when a colonoscopy is done as part of routine cancer screening tests. °How is this treated? °Treatment for this condition involves removing any polyps that are found. Most polyps can be removed during a colonoscopy. Those polyps will then be tested for cancer. Additional treatment may be needed depending on the results of testing. °Follow these instructions at home: °Lifestyle °· Maintain a healthy weight, or lose weight if recommended by your health care provider. °· Exercise every day or as told by your health care provider. °· Do not use any products that contain nicotine or tobacco, such as cigarettes and e-cigarettes. If you need help quitting, ask your health care provider. °· If you drink alcohol, limit how much you have: °? 0-1 drink a day for women. °? 0-2 drinks a day for men. °· Be aware of how much alcohol is in your drink. In the [place] one drink equals one 12 oz bottle of beer (355 mL), one 5 oz glass of wine (148 mL), or one 1½ oz shot of hard liquor (44 mL). °Eating and drinking ° °· Eat foods that are high in fiber, such as fruits, vegetables, and whole grains. °· Eat foods that are high in calcium and vitamin D, such as milk, cheese, yogurt, eggs, liver, fish, and broccoli. °· Limit foods that are high in fat, such as fried foods and desserts. °· Limit the amount of red meat and  processed meat you eat, such as hot dogs, sausage, bacon, and lunch meats. °General instructions °· Keep all follow-up visits as told by your health care provider. This is important. °? This includes having regularly scheduled colonoscopies. °? Talk to your health care provider about when you need a colonoscopy. °Contact a health care provider if: °· You have new or worsening bleeding during a bowel movement. °· You have new or increased blood in your stool. °· You have a change in bowel habits. °· You lose weight for no   known reason. °Summary °· Polyps are tissue growths inside the body. Polyps can grow in many places, including the colon. °· Most colon polyps are noncancerous (benign), but some can become cancerous over time. °· This condition is diagnosed with a colonoscopy. °· Treatment for this condition involves removing any polyps that are found. Most polyps can be removed during a colonoscopy. °This information is not intended to replace advice given to you by your health care provider. Make sure you discuss any questions you have with your health care provider. °Document Released: 04/12/2004 Document Revised: 11/01/2017 Document Reviewed: 11/01/2017 °Elsevier Patient Education © 2020 [employer] ° ° ° °

## 2019-02-26 NOTE — Op Note (Signed)
[facility] Patient Name: David Weiss Procedure Date: 02/26/2019 10:55 AM MRN: [id number] Date of Birth: 04-21-1946 Attending MD: Hildred Laser , MD CSN: [id number] Age: 73 Admit Type: Outpatient Procedure:                Colonoscopy Indications:              High risk colon cancer surveillance: Personal                            history of colonic polyps Providers:                Hildred Laser, MD, Otis Peak B. Sharon Seller, RN, Raphael Gibney, Technician Referring MD:             Asencion Noble, MD Medicines:                Meperidine 50 mg IV, Midazolam 7 mg IV Complications:            No immediate complications. Estimated Blood Loss:     Estimated blood loss was minimal. Procedure:                Pre-Anesthesia Assessment:                           - Prior to the procedure, a History and Physical                            was performed, and patient medications and                            allergies were reviewed. The patient's tolerance of                            previous anesthesia was also reviewed. The risks                            and benefits of the procedure and the sedation                            options and risks were discussed with the patient.                            All questions were answered, and informed consent                            was obtained. Prior Anticoagulants: The patient has                            taken no previous anticoagulant or antiplatelet                            agents. ASA Grade Assessment: II - A patient with  mild systemic disease. After reviewing the risks                            and benefits, the patient was deemed in                            satisfactory condition to undergo the procedure.                           After obtaining informed consent, the colonoscope                            was passed under direct vision. Throughout the   procedure, the patient's blood pressure, pulse, and                            oxygen saturations were monitored continuously. The                            PCF-H190DL ([id number]) scope was introduced through                            the anus and advanced to the the cecum, identified                            by appendiceal orifice and ileocecal valve. The                            colonoscopy was performed without difficulty. The                            patient tolerated the procedure well. The quality                            of the bowel preparation was adequate. The                            ileocecal valve, appendiceal orifice, and rectum                            were photographed. Scope In: 11:08:19 AM Scope Out: 11:37:12 AM Scope Withdrawal Time: 0 hours 22 minutes 43 seconds  Total Procedure Duration: 0 hours 28 minutes 53 seconds  Findings:      The perianal and digital rectal examinations were normal.      Three sessile polyps were found in the splenic flexure and ascending       colon. The polyps were small in size. These polyps were removed with a       cold snare. Resection and retrieval were complete. The pathology       specimen was placed into Bottle Number 1.      Two sessile polyps were found in the hepatic flexure. The polyps were       small in size. These were biopsied with a cold forceps for histology.       The pathology specimen was placed into  Bottle Number 1.      Multiple diverticula were found in the sigmoid colon.      The retroflexed view of the distal rectum and anal verge was normal and       showed no anal or rectal abnormalities. Impression:               - Three small polyps at the splenic flexure and in                            the ascending colon, removed with a cold snare.                            Resected and retrieved.                           - Two small polyps at the hepatic flexure. Biopsied.                           -  Diverticulosis in the sigmoid colon. Moderate Sedation:      Moderate (conscious) sedation was administered by the endoscopy nurse       and supervised by the endoscopist. The following parameters were       monitored: oxygen saturation, heart rate, blood pressure, CO2       capnography and response to care. Total physician intraservice time was       35 minutes. Recommendation:           - Patient has a contact number available for                            emergencies. The signs and symptoms of potential                            delayed complications were discussed with the                            patient. Return to normal activities tomorrow.                            Written discharge instructions were provided to the                            patient.                           - High fiber diet today.                           - Continue present medications.                           - No aspirin, ibuprofen, naproxen, or other                            non-steroidal anti-inflammatory drugs for 1 day.                           -  Await pathology results.                           - Repeat colonoscopy in 5 years for surveillance. Procedure Code(s):        --- Professional ---                           [phone], Colonoscopy, flexible; with removal of                            tumor(s), polyp(s), or other lesion(s) by snare                            technique                           45380, 59, Colonoscopy, flexible; with biopsy,                            single or multiple                           99153, Moderate sedation; each additional 15                            minutes intraservice time                           G0500, Moderate sedation services provided by the                            same physician or other qualified health care                            professional performing a gastrointestinal                            endoscopic service that sedation  supports,                            requiring the presence of an independent trained                            observer to assist in the monitoring of the                            patient's level of consciousness and physiological                            status; initial 15 minutes of intra-service time;                            patient age 15 years or older (additional time may                            be reported with  99153, as appropriate) Diagnosis Code(s):        --- Professional ---                           Z86.010, Personal history of colonic polyps                           K63.5, Polyp of colon                           K57.30, Diverticulosis of large intestine without                            perforation or abscess without bleeding CPT copyright 2019 American [employer] Association. All rights reserved. The codes documented in this report are preliminary and upon coder review may  be revised to meet current compliance requirements. Hildred Laser, MD Hildred Laser, MD 02/26/2019 11:47:38 AM This report has been signed electronically. Number of Addenda: 0

## 2019-03-06 ENCOUNTER — Encounter (HOSPITAL_COMMUNITY): Payer: Self-pay | Admitting: Internal Medicine

## 2019-03-21 ENCOUNTER — Telehealth: Payer: Self-pay | Admitting: Sports Medicine

## 2019-03-21 DIAGNOSIS — M7661 Achilles tendinitis, right leg: Secondary | ICD-10-CM

## 2019-03-21 DIAGNOSIS — M79671 Pain in right foot: Secondary | ICD-10-CM

## 2019-03-21 DIAGNOSIS — M9261 Juvenile osteochondrosis of tarsus, right ankle: Secondary | ICD-10-CM

## 2019-03-21 NOTE — Telephone Encounter (Signed)
Pt states Dr. Cannon Kettle told him if he did not get any better to call and she would get him set up for rehab, and he didn't want to set up and appt to come back in.

## 2019-03-21 NOTE — Telephone Encounter (Signed)
Yes please please send Rx for patient to go to PT since he is not doing better as I noted in my LOV Thanks Dr. Chauncey Cruel

## 2019-03-24 NOTE — Telephone Encounter (Signed)
No direct referral to PT is acceptable since we talked about it last visit Thanks Dr. Cannon Kettle

## 2019-03-24 NOTE — Telephone Encounter (Signed)
Faxed referral, demographics to [facility].

## 2019-03-26 ENCOUNTER — Encounter (HOSPITAL_COMMUNITY): Payer: Self-pay | Admitting: Physical Therapy

## 2019-03-26 ENCOUNTER — Other Ambulatory Visit: Payer: Self-pay

## 2019-03-26 ENCOUNTER — Ambulatory Visit (HOSPITAL_COMMUNITY): Payer: Medicare Other | Attending: Sports Medicine | Admitting: Physical Therapy

## 2019-03-26 DIAGNOSIS — R262 Difficulty in walking, not elsewhere classified: Secondary | ICD-10-CM

## 2019-03-26 DIAGNOSIS — R29898 Other symptoms and signs involving the musculoskeletal system: Secondary | ICD-10-CM | POA: Diagnosis not present

## 2019-03-26 DIAGNOSIS — M25571 Pain in right ankle and joints of right foot: Secondary | ICD-10-CM | POA: Insufficient documentation

## 2019-03-26 NOTE — Patient Instructions (Addendum)
Balance: Unilateral    Attempt to balance on left leg, eyes open. Hold _30-45 ___ seconds. Repeat _3___ times per set. Do __1__ sets per session. Do _2___ sessions per day. Perform exercise with eyes closed.  http://orth.exer.us/28   Copyright  VHI. All rights reserved.  Gastroc Stretch    Stand with right foot back, leg straight, forward leg bent. Keeping heel on floor, turned slightly out, lean into wall until stretch is felt in calf. Hold __30__ seconds. Repeat _3___ times per set. Do __1__ sets per session. Do __2__ sessions per day.  http://orth.exer.us/26   Copyright  VHI. All rights reserved.

## 2019-03-26 NOTE — Therapy (Signed)
[place], [place], [address] Phone: [phone]   Fax:  [phone]  Physical Therapy Evaluation  Patient Details  Name: David Weiss MRN: [id number] Date of Birth: Jan 04, 1946 Referring Provider (PT): Landis Martins   Encounter Date: 03/26/2019  PT End of Session - 03/26/19 1211    Visit Number  1    Number of Visits  8    Date for PT Re-Evaluation  04/25/19    Authorization Type  medicare    Authorization - Visit Number  1    Authorization - Number of Visits  8    PT Start Time  [id number]    PT Stop Time  [id number]    PT Time Calculation (min)  50 min       Past Medical History:  Diagnosis Date  . Anemia   . Barrett's esophagus 5/11   EGD Dr. Phillips Climes Barrett's, multiple antral/bulbar erosins, D2 erosion sec NSAIDs  . Diverticulosis 5/11   Colonoscopy Dr Gala Romney  . Family hx of colon cancer   . GERD (gastroesophageal reflux disease)   . Helicobacter pylori gastritis 5/11  . HTN (hypertension)   . Hx of adenomatous colonic polyps 8/10   Colonoscopy Dr. Gala Romney  . Hyperlipemia   . Schatzki's ring 5/11   55F dilator    Past Surgical History:  Procedure Laterality Date  . BIOPSY N/A 08/30/2016   Procedure: BIOPSY;  Surgeon: Rogene Houston, MD;  [facility];  Service: Endoscopy;  Laterality: N/A;  esophageal  . CAPSULE ENDOSCOPY OF THE SMALL BOWEL  02/17/2011      . COLONOSCOPY  05/19/2011   Procedure: COLONOSCOPY;  Surgeon: Daneil Dolin, MD;  Location: [facility];  Service: Endoscopy;  Laterality: N/A;  8:30  . COLONOSCOPY N/A 08/21/2013   Procedure: COLONOSCOPY;  Surgeon: Rogene Houston, MD;  [facility]: [facility];  Service: Endoscopy;  Laterality: N/A;  930  . COLONOSCOPY N/A 02/26/2019   Procedure: COLONOSCOPY;  Surgeon: Rogene Houston, MD;  [facility]: [facility];  Service: Endoscopy;  Laterality: N/A;  9:30-rescheduled to 5/20 @ 11:15 per Lelon Frohlich  . EAR CYST EXCISION N/A 03/25/2014    Procedure: EXCISION OF NEOPLASM NECK;  Surgeon: Jamesetta So, MD;  Location: [facility];  Service: General;  Laterality: N/A;  . ESOPHAGOGASTRODUODENOSCOPY  02/13/2011   Procedure: ESOPHAGOGASTRODUODENOSCOPY (EGD);  Surgeon: Daneil Dolin, MD;  [facility];  Service: Endoscopy;  Laterality: N/A;  . ESOPHAGOGASTRODUODENOSCOPY N/A 08/30/2016   Procedure: ESOPHAGOGASTRODUODENOSCOPY (EGD);  Surgeon: Rogene Houston, MD;  [facility];  Service: Endoscopy;  Laterality: N/A;  730  . excisional hernia  01/2013  . GIVENS CAPSULE STUDY  02/15/2011   Procedure: GIVENS CAPSULE STUDY;  Surgeon: Daneil Dolin, MD;  [facility];  Service: Endoscopy;  Laterality: N/A;  . POLYPECTOMY  02/26/2019   Procedure: POLYPECTOMY;  Surgeon: Rogene Houston, MD;  [facility];  Service: Endoscopy;;  colon  . small intestine partial removal  06/2011   due to malignant tumor  . VASECTOMY      There were no vitals filed for this visit.   Subjective Assessment - 03/26/19 1205    Subjective  David Weiss states that he noticed about three months ago that he began having pain in his Rt heel that radiates into his achilles tendon.   He states that the pain is better but is still bothersome.  He  states that the pain is immediate and improves the more he moves.   HE has taken two bouts of antiinflammatory with no real improvement therefore he is being referred to skilled PT.    Pertinent History  Haglunds deformit B; Cancer remote, hx of eczuma on Rt heel    Limitations  Standing;Walking    How long can you stand comfortably?  After 1:13 pt wants to adjust shift his wt.    How long can you walk comfortably?  He was walking five miles a day now he is walking about 3/4 a mile at this time due to increased pain    Patient Stated Goals  To be able to walk    Pain Onset  More than a month ago         [facility] PT Assessment - 03/26/19 0001      Assessment   Medical Diagnosis  Rt achilles  tendonitis    Referring Provider (PT)  Landis Martins    Prior Therapy  none      Precautions   Precautions  None      Restrictions   Weight Bearing Restrictions  No      Balance Screen   Has the patient fallen in the past 6 months  No    Has the patient had a decrease in activity level because of a fear of falling?   Yes    Is the patient reluctant to leave their home because of a fear of falling?   No      Home Film/video editor residence      Prior Function   Level of Independence  Independent      Cognition   Overall Cognitive Status  Within Functional Limits for tasks assessed      Observation/Other Assessments   Focus on Therapeutic Outcomes (FOTO)   51 % limited      Functional Tests   Functional tests  Single leg stance      Single Leg Stance   Comments  LT: 60"   RT: 15"       ROM / Strength   AROM / PROM / Strength  AROM;Strength      AROM   AROM Assessment Site  Ankle    Right/Left Knee  Right;Left    Right/Left Ankle  Right;Left    Right Ankle Dorsiflexion  4    Right Ankle Plantar Flexion  75    Right Ankle Inversion  18    Right Ankle Eversion  12    Left Ankle Dorsiflexion  8    Left Ankle Plantar Flexion  73    Left Ankle Inversion  25    Left Ankle Eversion  12      Strength   Strength Assessment Site  Ankle    Right/Left Ankle  Right;Left    Right Ankle Dorsiflexion  4+/5    Right Ankle Plantar Flexion  4+/5    Right Ankle Inversion  5/5    Right Ankle Eversion  5/5    Left Ankle Dorsiflexion  5/5    Left Ankle Plantar Flexion  5/5    Left Ankle Inversion  5/5    Left Ankle Eversion  5/5      Palpation   Palpation comment  very tight achilles and gastroc mm                 Objective measurements completed on examination: See above findings.  [facility] PT Treatment/Exercise - 03/26/19 0001      Exercises   Exercises  Ankle      Manual Therapy   Manual Therapy  Soft tissue mobilization     Manual therapy comments  done seperate from all other aspects of treatment    Soft tissue mobilization  Rt gastroc mm/ tendon to decrease pain and tightness       Ankle Exercises: Stretches   Gastroc Stretch  3 reps;30 seconds      Ankle Exercises: Standing   SLS  3x Rt LE max 15"   notably unstable            PT Education - 03/26/19 1209    Education Details  HEP    Person(s) Educated  Patient    Methods  Explanation;Demonstration;Verbal cues;Handout    Comprehension  Returned demonstration       PT Short Term Goals - 03/26/19 1226      PT SHORT TERM GOAL #1   Title  PT to be I in HEP to decrease pain level in Rt heel to allow pt to be able to stand for five minues without shifting wt to be able to complete self grooming tasks in comfort.    Time  2    Period  Weeks    Status  New    Target Date  04/09/19      PT SHORT TERM GOAL #2   Title  PT to be able to walk 1.5 miles without increased pain in RT heel/achilles.    Time  2    Period  Weeks    Status  New      PT SHORT TERM GOAL #3   Title  PT to be able to single leg stance for 40 seconds on his RT LE to demonstrate improved stability to decrease pain with walking activity.    Time  2    Period  Weeks    Status  New        PT Long Term Goals - 03/26/19 1229      PT LONG TERM GOAL #1   Title  PT to be able to stand with equal weight bearing for ten minutes or more to allow pt to be able to stand comfortably for socialization.    Time  4    Period  Weeks    Status  New    Target Date  04/23/19      PT LONG TERM GOAL #2   Title  PT to be able to walk for 2.5 miles without experiencing increased pain to return to normal walking with his dog.    Time  4    Period  Weeks    Status  New      PT LONG TERM GOAL #3   Title  PT to be able to stand on his RT leg for 60 seconds for normal stabilty to allow pt to be able to walk on uneven ground in comfort.    Period  Weeks    Status  New              Plan - 03/26/19 1215    Clinical Impression Statement  David Weiss is a 73 yo male who had been enjoying walking five miles a day up until three months ago.  At that time he experienced increased RT heel pain that radiated into his achilles tendon.  He has had two bouts of anti-inflammatory medication without relief; he is  now being referred to skilled therapy to decrease his pain and improve his mobility.  Evaluation demonstrates decreased ROM, decreased strength, decreased balance and tight musculature.  David Weiss will benefit from skilled PT to address these issues and maximize his functional ability.    Personal Factors and Comorbidities  Time since onset of injury/illness/exacerbation    Examination-Activity Limitations  Stand;Squat;Stairs;Locomotion Level    Examination-Participation Restrictions  Community Activity;Other;Yard Work    Public affairs consultant  Low    Rehab Potential  Good    PT Frequency  2x / week    PT Duration  4 weeks    PT Treatment/Interventions  ADLs/Self Care Home Management;Patient/family education;Manual techniques;Iontophoresis 4mg /ml Dexamethasone;Passive range of motion;Balance training;Therapeutic exercise    PT Next Visit Plan  begin slant board stretching, plantar fascia and soleus stretches, continue to work on balance, manual for pain and check to see if prescription for iontophoresis has returned.    PT Home Exercise Plan  gastroc stretch, Single leg stance.    Consulted and Agree with Plan of Care  Patient       Patient will benefit from skilled therapeutic intervention in order to improve the following deficits and impairments:  Abnormal gait, Decreased activity tolerance, Decreased balance, Increased edema, Decreased strength, Decreased range of motion, Increased fascial restricitons, Pain  Visit Diagnosis: Pain in right ankle and joints of right foot  Difficulty in  walking, not elsewhere classified  Other symptoms and signs involving the musculoskeletal system     Problem List Patient Active Problem List   Diagnosis Date Noted  . Hx of colonic polyps 08/07/2018  . Barrett's esophagus without dysplasia 05/10/2016  . Incisional hernia 02/08/2013  . GIST (gastrointestinal stromal tumor) of small bowel, malignant ([place]) 07/04/2011  . Status post small bowel resection 06/22/2011  . Abdominal mass 06/19/2011  . Essential hypertension 06/19/2011  . GI bleeding 06/17/2011  . Acute blood loss anemia 06/17/2011  . Syncope 06/17/2011  . Iron deficiency anemia 04/19/2011  . Schatzki's ring 01/03/2011  . Colon adenoma 01/03/2011  . Family hx of colon cancer 01/03/2011  . HELICOBACTER PYLORI GASTRITIS 12/22/2009  . [facility] ESOPHAGUS 12/22/2009    Rayetta Humphrey, PT CLT [phone] 03/26/2019, 12:36 PM  [facility] [address] [place], [place], [address] Phone: [phone]   Fax:  [phone]  Name: David Weiss MRN: [id number] Date of Birth: 08-Aug-1945

## 2019-03-28 ENCOUNTER — Ambulatory Visit (HOSPITAL_COMMUNITY): Payer: Medicare Other

## 2019-03-28 ENCOUNTER — Encounter (HOSPITAL_COMMUNITY): Payer: Self-pay

## 2019-03-28 ENCOUNTER — Other Ambulatory Visit: Payer: Self-pay

## 2019-03-28 DIAGNOSIS — R262 Difficulty in walking, not elsewhere classified: Secondary | ICD-10-CM

## 2019-03-28 DIAGNOSIS — R29898 Other symptoms and signs involving the musculoskeletal system: Secondary | ICD-10-CM

## 2019-03-28 DIAGNOSIS — M25571 Pain in right ankle and joints of right foot: Secondary | ICD-10-CM | POA: Diagnosis not present

## 2019-03-28 NOTE — Therapy (Signed)
[place], [place], [address] Phone: [phone]   Fax:  [phone]  Physical Therapy Treatment  Patient Details  Name: David Weiss MRN: [id number] Date of Birth: 29-Sep-1945 Referring Provider (PT): Landis Martins   Encounter Date: 03/28/2019  PT End of Session - 03/28/19 0818    Visit Number  2    Number of Visits  8    Date for PT Re-Evaluation  04/25/19    Authorization Type  medicare    Authorization - Visit Number  2    Authorization - Number of Visits  8    PT Start Time  0815    PT Stop Time  [id number]    PT Time Calculation (min)  40 min    Activity Tolerance  Patient tolerated treatment well    Behavior During Therapy  [facility] for tasks assessed/performed       Past Medical History:  Diagnosis Date  . Anemia   . Barrett's esophagus 5/11   EGD Dr. Phillips Climes Barrett's, multiple antral/bulbar erosins, D2 erosion sec NSAIDs  . Diverticulosis 5/11   Colonoscopy Dr Gala Romney  . Family hx of colon cancer   . GERD (gastroesophageal reflux disease)   . Helicobacter pylori gastritis 5/11  . HTN (hypertension)   . Hx of adenomatous colonic polyps 8/10   Colonoscopy Dr. Gala Romney  . Hyperlipemia   . Schatzki's ring 5/11   71F dilator    Past Surgical History:  Procedure Laterality Date  . BIOPSY N/A 08/30/2016   Procedure: BIOPSY;  Surgeon: Rogene Houston, MD;  [facility];  Service: Endoscopy;  Laterality: N/A;  esophageal  . CAPSULE ENDOSCOPY OF THE SMALL BOWEL  02/17/2011      . COLONOSCOPY  05/19/2011   Procedure: COLONOSCOPY;  Surgeon: Daneil Dolin, MD;  [facility];  Service: Endoscopy;  Laterality: N/A;  8:30  . COLONOSCOPY N/A 08/21/2013   Procedure: COLONOSCOPY;  Surgeon: Rogene Houston, MD;  [facility];  Service: Endoscopy;  Laterality: N/A;  930  . COLONOSCOPY N/A 02/26/2019   Procedure: COLONOSCOPY;  Surgeon: Rogene Houston, MD;  [facility];   Service: Endoscopy;  Laterality: N/A;  9:30-rescheduled to 5/20 @ 11:15 per Lelon Frohlich  . EAR CYST EXCISION N/A 03/25/2014   Procedure: EXCISION OF NEOPLASM NECK;  Surgeon: Jamesetta So, MD;  Location: [facility];  Service: General;  Laterality: N/A;  . ESOPHAGOGASTRODUODENOSCOPY  02/13/2011   Procedure: ESOPHAGOGASTRODUODENOSCOPY (EGD);  Surgeon: Daneil Dolin, MD;  [facility];  Service: Endoscopy;  Laterality: N/A;  . ESOPHAGOGASTRODUODENOSCOPY N/A 08/30/2016   Procedure: ESOPHAGOGASTRODUODENOSCOPY (EGD);  Surgeon: Rogene Houston, MD;  [facility];  Service: Endoscopy;  Laterality: N/A;  730  . excisional hernia  01/2013  . GIVENS CAPSULE STUDY  02/15/2011   Procedure: GIVENS CAPSULE STUDY;  Surgeon: Daneil Dolin, MD;  [facility];  Service: Endoscopy;  Laterality: N/A;  . POLYPECTOMY  02/26/2019   Procedure: POLYPECTOMY;  Surgeon: Rogene Houston, MD;  [facility];  Service: Endoscopy;;  colon  . small intestine partial removal  06/2011   due to malignant tumor  . VASECTOMY      There were no vitals filed for this visit.  Subjective Assessment - 03/28/19 0817    Subjective  Pt reports no problems with exercises. Pt reports no pain, but can feel it, "it's not right".  Pertinent History  Haglunds deformit B; Cancer remote, hx of eczuma on Rt heel    Limitations  Standing;Walking    How long can you stand comfortably?  After 1:13 pt wants to adjust shift his wt.    How long can you walk comfortably?  He was walking five miles a day now he is walking about 3/4 a mile at this time due to increased pain    Patient Stated Goals  To be able to walk    Currently in Pain?  No/denies    Pain Onset  More than a month ago          [facility] Adult PT Treatment/Exercise - 03/28/19 0001      Ambulation/Gait   Stairs  Yes    Number of Stairs  8    Height of Stairs  6    Gait Comments  increased R foot external rotation, decreased dorsiflexion, early heel  rise with descent      Manual Therapy   Manual Therapy  Soft tissue mobilization    Manual therapy comments  done seperate from all other aspects of treatment    Soft tissue mobilization  Rt gastroc mm/ tendon to decrease pain and tightness       Ankle Exercises: Stretches   Soleus Stretch  3 reps;30 seconds   RLE   Gastroc Stretch  3 reps;30 seconds    Other Stretch  R knee drives, 12" step, [id number] reps      Ankle Exercises: Standing   Vector Stance  Right   LLE reaching for 3 cones, single UE support   Other Standing Ankle Exercises  bil heel raise, eccentric lower on RLE, x12 reps; calf raises (toes straight, out, in), x10 reps each    Other Standing Ankle Exercises  SLS, BLE in // bars      Ankle Exercises: Seated   Towel Crunch  5 reps    Other Seated Ankle Exercises  arch doming, x15 reps R foot             PT Education - 03/28/19 0817    Education Details  Reviewed goals, exercise technique, continue HEP    Person(s) Educated  Patient    Methods  Explanation    Comprehension  Verbalized understanding       PT Short Term Goals - 03/28/19 0905      PT SHORT TERM GOAL #1   Title  PT to be I in HEP to decrease pain level in Rt heel to allow pt to be able to stand for five minues without shifting wt to be able to complete self grooming tasks in comfort.    Time  2    Period  Weeks    Status  On-going    Target Date  04/09/19      PT SHORT TERM GOAL #2   Title  PT to be able to walk 1.5 miles without increased pain in RT heel/achilles.    Time  2    Period  Weeks    Status  On-going      PT SHORT TERM GOAL #3   Title  PT to be able to single leg stance for 40 seconds on his RT LE to demonstrate improved stability to decrease pain with walking activity.    Time  2    Period  Weeks    Status  On-going        PT Long Term Goals - 03/28/19 [id number]  PT LONG TERM GOAL #1   Title  PT to be able to stand with equal weight bearing for ten minutes or more to  allow pt to be able to stand comfortably for socialization.    Time  4    Period  Weeks    Status  On-going      PT LONG TERM GOAL #2   Title  PT to be able to walk for 2.5 miles without experiencing increased pain to return to normal walking with his dog.    Time  4    Period  Weeks    Status  On-going      PT LONG TERM GOAL #3   Title  PT to be able to stand on his RT leg for 60 seconds for normal stabilty to allow pt to be able to walk on uneven ground in comfort.    Period  Weeks    Status  On-going            Plan - 03/28/19 [id number]    Clinical Impression Statement  Initiated treatment session with reviewing HEP performance. Began stretching for gastroc and soleus on slant board within tolerable range. Added heel raises in multiple forms to strengthen both heads of R calf and concentric and eccentric strength. Pt with good performance of arch doming and towel crunching with minimal initial verbal cues only. Ended with STM to R calf muscle, achilles tendon and over posterior heel to alleviate pain and improve palpable muscle restrictions. Pt demonstrates impaired gait mechanics with stair negotiation and SLS exercises, maintains R foot externally rotated decreasing the dorsiflexion needed for activities despite verbal cues to improve. Pt with slightly increased pain with exercises and STM reporting 2/10 at end; educated on soreness with exercise and STM and educated to ice to alleviate pain. Continue to progress as able.    Personal Factors and Comorbidities  Time since onset of injury/illness/exacerbation    Examination-Activity Limitations  Stand;Squat;Stairs;Locomotion Level    Examination-Participation Restrictions  Community Activity;Other;Yard Work    [employer]  Evolving/Moderate complexity    Rehab Potential  Good    PT Frequency  2x / week    PT Duration  4 weeks    PT Treatment/Interventions  ADLs/Self Care Home Management;Patient/family  education;Manual techniques;Iontophoresis 4mg /ml Dexamethasone;Passive range of motion;Balance training;Therapeutic exercise    PT Next Visit Plan  Continue slant board stretching for gastroc/soleus, plantar fascia stretch; Eccesntric strengthening and increased load for R tendonitis. Continue to work on balance, manual for pain. Check to see if prescription for iontophoresis has returned.    PT Home Exercise Plan  gastroc stretch, Single leg stance    Consulted and Agree with Plan of Care  Patient       Patient will benefit from skilled therapeutic intervention in order to improve the following deficits and impairments:  Abnormal gait, Decreased activity tolerance, Decreased balance, Increased edema, Decreased strength, Decreased range of motion, Increased fascial restricitons, Pain  Visit Diagnosis: Pain in right ankle and joints of right foot  Difficulty in walking, not elsewhere classified  Other symptoms and signs involving the musculoskeletal system     Problem List Patient Active Problem List   Diagnosis Date Noted  . Hx of colonic polyps 08/07/2018  . Barrett's esophagus without dysplasia 05/10/2016  . Incisional hernia 02/08/2013  . GIST (gastrointestinal stromal tumor) of small bowel, malignant ([place]) 07/04/2011  . Status post small bowel resection 06/22/2011  . Abdominal mass 06/19/2011  . Essential hypertension  06/19/2011  . GI bleeding 06/17/2011  . Acute blood loss anemia 06/17/2011  . Syncope 06/17/2011  . Iron deficiency anemia 04/19/2011  . Schatzki's ring 01/03/2011  . Colon adenoma 01/03/2011  . Family hx of colon cancer 01/03/2011  . HELICOBACTER PYLORI GASTRITIS 12/22/2009  . BARRETTS ESOPHAGUS 12/22/2009     Talbot Grumbling PT, DPT 03/28/19, 9:07 AM [place] [facility] [address] [place], [place], [address] Phone: [phone]   Fax:  [phone]  Name: David Weiss MRN: [id number] Date of  Birth: 03/12/1946

## 2019-03-31 ENCOUNTER — Encounter (HOSPITAL_COMMUNITY): Payer: Self-pay

## 2019-03-31 ENCOUNTER — Other Ambulatory Visit: Payer: Self-pay

## 2019-03-31 ENCOUNTER — Ambulatory Visit (HOSPITAL_COMMUNITY): Payer: Medicare Other

## 2019-03-31 DIAGNOSIS — R29898 Other symptoms and signs involving the musculoskeletal system: Secondary | ICD-10-CM

## 2019-03-31 DIAGNOSIS — R262 Difficulty in walking, not elsewhere classified: Secondary | ICD-10-CM

## 2019-03-31 DIAGNOSIS — M25571 Pain in right ankle and joints of right foot: Secondary | ICD-10-CM | POA: Diagnosis not present

## 2019-03-31 NOTE — Patient Instructions (Signed)
Eccentric Heel Lowering on Step reps: 15-20 sets: 3 daily: 1 weekly: 7   Exercise image step 1   Exercise image step 2 you can perform this exercise on flat ground if a step is unavailable  Setup  Begin standing on a small step or platform with your heels off the edge, holding onto a stable object for balance. Movement  Raise both heels up, then lift one foot off the platform and slowly lower your other heel. Repeat this movement. Tip  Make sure to maintain your balance and keep your back straight throughout the exercise.

## 2019-03-31 NOTE — Therapy (Signed)
[place], [place], [address] Phone: [phone]   Fax:  [phone]  Physical Therapy Treatment  Patient Details  Name: David Weiss MRN: [id number] Date of Birth: 1946/03/10 Referring Provider (PT): Landis Martins   Encounter Date: 03/31/2019  PT End of Session - 03/31/19 0826    Visit Number  3    Number of Visits  8    Date for PT Re-Evaluation  04/25/19    Authorization Type  medicare    Authorization - Visit Number  3    Authorization - Number of Visits  8    PT Start Time  [phone]    PT Stop Time  0913    PT Time Calculation (min)  41 min    Activity Tolerance  Patient tolerated treatment well    Behavior During Therapy  [facility] for tasks assessed/performed       Past Medical History:  Diagnosis Date  . Anemia   . Barrett's esophagus 5/11   EGD Dr. Phillips Climes Barrett's, multiple antral/bulbar erosins, D2 erosion sec NSAIDs  . Diverticulosis 5/11   Colonoscopy Dr Gala Romney  . Family hx of colon cancer   . GERD (gastroesophageal reflux disease)   . Helicobacter pylori gastritis 5/11  . HTN (hypertension)   . Hx of adenomatous colonic polyps 8/10   Colonoscopy Dr. Gala Romney  . Hyperlipemia   . Schatzki's ring 5/11   48F dilator    Past Surgical History:  Procedure Laterality Date  . BIOPSY N/A 08/30/2016   Procedure: BIOPSY;  Surgeon: Rogene Houston, MD;  [facility];  Service: Endoscopy;  Laterality: N/A;  esophageal  . CAPSULE ENDOSCOPY OF THE SMALL BOWEL  02/17/2011      . COLONOSCOPY  05/19/2011   Procedure: COLONOSCOPY;  Surgeon: Daneil Dolin, MD;  [facility];  Service: Endoscopy;  Laterality: N/A;  8:30  . COLONOSCOPY N/A 08/21/2013   Procedure: COLONOSCOPY;  Surgeon: Rogene Houston, MD;  [facility];  Service: Endoscopy;  Laterality: N/A;  930  . COLONOSCOPY N/A 02/26/2019   Procedure: COLONOSCOPY;  Surgeon: Rogene Houston, MD;  [facility];   Service: Endoscopy;  Laterality: N/A;  9:30-rescheduled to 5/20 @ 11:15 per Lelon Frohlich  . EAR CYST EXCISION N/A 03/25/2014   Procedure: EXCISION OF NEOPLASM NECK;  Surgeon: Jamesetta So, MD;  Location: [facility];  Service: General;  Laterality: N/A;  . ESOPHAGOGASTRODUODENOSCOPY  02/13/2011   Procedure: ESOPHAGOGASTRODUODENOSCOPY (EGD);  Surgeon: Daneil Dolin, MD;  [facility];  Service: Endoscopy;  Laterality: N/A;  . ESOPHAGOGASTRODUODENOSCOPY N/A 08/30/2016   Procedure: ESOPHAGOGASTRODUODENOSCOPY (EGD);  Surgeon: Rogene Houston, MD;  [facility];  Service: Endoscopy;  Laterality: N/A;  730  . excisional hernia  01/2013  . GIVENS CAPSULE STUDY  02/15/2011   Procedure: GIVENS CAPSULE STUDY;  Surgeon: Daneil Dolin, MD;  [facility];  Service: Endoscopy;  Laterality: N/A;  . POLYPECTOMY  02/26/2019   Procedure: POLYPECTOMY;  Surgeon: Rogene Houston, MD;  [facility];  Service: Endoscopy;;  colon  . small intestine partial removal  06/2011   due to malignant tumor  . VASECTOMY      There were no vitals filed for this visit.  Subjective Assessment - 03/31/19 0825    Subjective  Pt reports HEP is going well, his heel is still sore intermittently.    Pertinent History  Haglunds deformit  B; Cancer remote, hx of eczuma on Rt heel    Limitations  Standing;Walking    How long can you stand comfortably?  After 1:13 pt wants to adjust shift his wt.    How long can you walk comfortably?  He was walking five miles a day now he is walking about 3/4 a mile at this time due to increased pain    Patient Stated Goals  To be able to walk    Currently in Pain?  No/denies         [facility] Adult PT Treatment/Exercise - 03/31/19 0001      Exercises   Exercises  Ankle      Manual Therapy   Manual Therapy  Joint mobilization;Soft tissue mobilization    Manual therapy comments  done seperate from all other aspects of treatment    Joint Mobilization  AP glide to Rt  talocrural joint, 3x 30-45 seconds, grade III    Soft tissue mobilization  STM to Rt gastroc soleus complex in prone      Ankle Exercises: Stretches   Soleus Stretch  3 reps;30 seconds   Rt LE   Gastroc Stretch  3 reps;30 seconds   Rt LE   Other Stretch  R knee drives, 12" step, [id number] reps, 10 sec AP glide to TCJ for Rt ankle dorsiflexion      Ankle Exercises: Standing   Other Standing Ankle Exercises  Bil heel raise on 4" step with Rt eccentric lowering, 2x 15 reps; 2x 20 reps bil heel raiase with ball at heel (post tib); split isometric heel raise bil, 10 reps, 10 sec holds      Ankle Exercises: Seated   Other Seated Ankle Exercises  1x 15 reps short foot intrinsice (arch domming) Rt LE        PT Education - 03/31/19 0859    Education Details  Educated on exercise form throughout and purpose of interventions. Educated on    [employer]) Educated  Patient    Methods  Handout;Demonstration;Explanation    Comprehension  Verbalized understanding;Returned demonstration       PT Short Term Goals - 03/28/19 0905      PT SHORT TERM GOAL #1   Title  PT to be I in HEP to decrease pain level in Rt heel to allow pt to be able to stand for five minues without shifting wt to be able to complete self grooming tasks in comfort.    Time  2    Period  Weeks    Status  On-going    Target Date  04/09/19      PT SHORT TERM GOAL #2   Title  PT to be able to walk 1.5 miles without increased pain in RT heel/achilles.    Time  2    Period  Weeks    Status  On-going      PT SHORT TERM GOAL #3   Title  PT to be able to single leg stance for 40 seconds on his RT LE to demonstrate improved stability to decrease pain with walking activity.    Time  2    Period  Weeks    Status  On-going        PT Long Term Goals - 03/28/19 0905      PT LONG TERM GOAL #1   Title  PT to be able to stand with equal weight bearing for ten minutes or more to allow pt to be able to stand comfortably for  socialization.     Time  4    Period  Weeks    Status  On-going      PT LONG TERM GOAL #2   Title  PT to be able to walk for 2.5 miles without experiencing increased pain to return to normal walking with his dog.    Time  4    Period  Weeks    Status  On-going      PT LONG TERM GOAL #3   Title  PT to be able to stand on his RT leg for 60 seconds for normal stabilty to allow pt to be able to walk on uneven ground in comfort.    Period  Weeks    Status  On-going          Plan - 03/31/19 [id number]    Clinical Impression Statement  Patient progressed exercises for eccentric and isometric achilles strengthening today on Rt LE. He reported mild discomfort with isometric split heel raise and gastroc/soleus stretches. Minor cues required throughout to achieve proper form with timing of eccentric lowering or isometric holds. Manual therapy continued today and patient denied discomfort with AP glide to Rt talocrural joint or STM to gastroc soleus complex. HEP updated today to include Rt eccentric lowering for gastroc. He will continue to benefit from skilled PT interventions to progress towards goals.    Personal Factors and Comorbidities  Time since onset of injury/illness/exacerbation    Examination-Activity Limitations  Stand;Squat;Stairs;Locomotion Level    Examination-Participation Restrictions  Community Activity;Other;Yard Work    [employer] officer  Evolving/Moderate complexity    Rehab Potential  Good    PT Frequency  2x / week    PT Duration  4 weeks    PT Treatment/Interventions  ADLs/Self Care Home Management;Patient/family education;Manual techniques;Iontophoresis 4mg /ml Dexamethasone;Passive range of motion;Balance training;Therapeutic exercise    PT Next Visit Plan  Continue slant board stretching for gastroc/soleus, plantar fascia stretch. Continue to increase reps for eccentric heel raise to increase load for R tendon. Perform SLS on compliant surface next session. Check to see if  prescription for iontophoresis has returned.    PT Home Exercise Plan  gastroc stretch, Single leg stance; eccentric lowering Rt heel raise    Consulted and Agree with Plan of Care  Patient       Patient will benefit from skilled therapeutic intervention in order to improve the following deficits and impairments:  Abnormal gait, Decreased activity tolerance, Decreased balance, Increased edema, Decreased strength, Decreased range of motion, Increased fascial restricitons, Pain  Visit Diagnosis: Pain in right ankle and joints of right foot  Difficulty in walking, not elsewhere classified  Other symptoms and signs involving the musculoskeletal system     Problem List Patient Active Problem List   Diagnosis Date Noted  . Hx of colonic polyps 08/07/2018  . Barrett's esophagus without dysplasia 05/10/2016  . Incisional hernia 02/08/2013  . GIST (gastrointestinal stromal tumor) of small bowel, malignant ([place]) 07/04/2011  . Status post small bowel resection 06/22/2011  . Abdominal mass 06/19/2011  . Essential hypertension 06/19/2011  . GI bleeding 06/17/2011  . Acute blood loss anemia 06/17/2011  . Syncope 06/17/2011  . Iron deficiency anemia 04/19/2011  . Schatzki's ring 01/03/2011  . Colon adenoma 01/03/2011  . Family hx of colon cancer 01/03/2011  . HELICOBACTER PYLORI GASTRITIS 12/22/2009  . BARRETTS ESOPHAGUS 12/22/2009    Kipp Brood, PT, DPT, [facility] Physical Therapist with [facility]  03/31/2019 9:33 AM  [place], [place], [address] Phone: [phone]   Fax:  [phone]  Name: ZAILEN STATZER MRN: [id number] Date of Birth: 1946/07/27

## 2019-04-02 ENCOUNTER — Other Ambulatory Visit: Payer: Self-pay

## 2019-04-02 ENCOUNTER — Ambulatory Visit (HOSPITAL_COMMUNITY): Payer: Medicare Other | Attending: Sports Medicine

## 2019-04-02 ENCOUNTER — Encounter (HOSPITAL_COMMUNITY): Payer: Self-pay

## 2019-04-02 DIAGNOSIS — M25571 Pain in right ankle and joints of right foot: Secondary | ICD-10-CM | POA: Diagnosis not present

## 2019-04-02 DIAGNOSIS — R262 Difficulty in walking, not elsewhere classified: Secondary | ICD-10-CM | POA: Diagnosis not present

## 2019-04-02 DIAGNOSIS — R29898 Other symptoms and signs involving the musculoskeletal system: Secondary | ICD-10-CM | POA: Diagnosis not present

## 2019-04-02 NOTE — Therapy (Signed)
[place], [place], [address] Phone: [phone]   Fax:  [phone]  Physical Therapy Treatment  Patient Details  Name: David Weiss MRN: [id number] Date of Birth: [birth year] Referring Provider (PT): Landis Martins   Encounter Date: 04/02/2019  PT End of Session - 04/02/19 0836    Visit Number  4    Number of Visits  8    Date for PT Re-Evaluation  04/25/19    Authorization Type  medicare    Authorization - Visit Number  4    Authorization - Number of Visits  8    PT Start Time  0828    PT Stop Time  0911    PT Time Calculation (min)  43 min    Activity Tolerance  Patient tolerated treatment well    Behavior During Therapy  [facility] for tasks assessed/performed       Past Medical History:  Diagnosis Date  . Anemia   . Barrett's esophagus 5/11   EGD Dr. Phillips Climes Barrett's, multiple antral/bulbar erosins, D2 erosion sec NSAIDs  . Diverticulosis 5/11   Colonoscopy Dr Gala Romney  . Family hx of colon cancer   . GERD (gastroesophageal reflux disease)   . Helicobacter pylori gastritis 5/11  . HTN (hypertension)   . Hx of adenomatous colonic polyps 8/10   Colonoscopy Dr. Gala Romney  . Hyperlipemia   . Schatzki's ring 5/11   44F dilator    Past Surgical History:  Procedure Laterality Date  . BIOPSY N/A 08/30/2016   Procedure: BIOPSY;  Surgeon: Rogene Houston, MD;  [facility];  Service: Endoscopy;  Laterality: N/A;  esophageal  . CAPSULE ENDOSCOPY OF THE SMALL BOWEL  02/17/2011      . COLONOSCOPY  05/19/2011   Procedure: COLONOSCOPY;  Surgeon: Daneil Dolin, MD;  [facility];  Service: Endoscopy;  Laterality: N/A;  8:30  . COLONOSCOPY N/A 08/21/2013   Procedure: COLONOSCOPY;  Surgeon: Rogene Houston, MD;  [facility];  Service: Endoscopy;  Laterality: N/A;  930  . COLONOSCOPY N/A 02/26/2019   Procedure: COLONOSCOPY;  Surgeon: Rogene Houston, MD;  [facility];   Service: Endoscopy;  Laterality: N/A;  9:30-rescheduled to 5/20 @ 11:15 per Lelon Frohlich  . EAR CYST EXCISION N/A 03/25/2014   Procedure: EXCISION OF NEOPLASM NECK;  Surgeon: Jamesetta So, MD;  Location: [facility];  Service: General;  Laterality: N/A;  . ESOPHAGOGASTRODUODENOSCOPY  02/13/2011   Procedure: ESOPHAGOGASTRODUODENOSCOPY (EGD);  Surgeon: Daneil Dolin, MD;  [facility];  Service: Endoscopy;  Laterality: N/A;  . ESOPHAGOGASTRODUODENOSCOPY N/A 08/30/2016   Procedure: ESOPHAGOGASTRODUODENOSCOPY (EGD);  Surgeon: Rogene Houston, MD;  [facility];  Service: Endoscopy;  Laterality: N/A;  730  . excisional hernia  01/2013  . GIVENS CAPSULE STUDY  02/15/2011   Procedure: GIVENS CAPSULE STUDY;  Surgeon: Daneil Dolin, MD;  [facility];  Service: Endoscopy;  Laterality: N/A;  . POLYPECTOMY  02/26/2019   Procedure: POLYPECTOMY;  Surgeon: Rogene Houston, MD;  [facility];  Service: Endoscopy;;  colon  . small intestine partial removal  06/2011   due to malignant tumor  . VASECTOMY      There were no vitals filed for this visit.  Subjective Assessment - 04/02/19 0833    Subjective  Pt stated he is feeling good today, reports tenderness to touch and increased pain with movement.  Pain scale 3/10  today    Pertinent History  Haglunds deformit B; Cancer remote, hx of eczuma on Rt heel    Patient Stated Goals  To be able to walk    Currently in Pain?  Yes    Pain Score  3     Pain Location  Heel    Pain Orientation  Right    Pain Descriptors / Indicators  Aching    Pain Type  Chronic pain    Pain Radiating Towards  to achilles    Pain Onset  More than a month ago    Pain Frequency  Intermittent    Aggravating Factors   standing and walkiing    Pain Relieving Factors  ice in the beginning                       [facility] Adult PT Treatment/Exercise - 04/02/19 0001      Exercises   Exercises  Ankle      Manual Therapy   Manual Therapy   Joint mobilization;Soft tissue mobilization    Manual therapy comments  done seperate from all other aspects of treatment    Soft tissue mobilization  STM to Rt gastroc soleus complex in prone      Ankle Exercises: Stretches   Soleus Stretch  3 reps;30 seconds    Gastroc Stretch  3 reps;30 seconds    Slant Board Stretch  3 reps;30 seconds    Other Stretch  R knee drives, 12" step, [id number] reps, 10 sec AP glide to TCJ for Rt ankle dorsiflexion      Ankle Exercises: Standing   Vector Stance  Right;3 reps;5 seconds    Vector Stance Limitations  1 intermittent HHA    SLS  3x BLE Rt 44", Lt 38"    Other Standing Ankle Exercises  Bil heel raise on 4" step with Rt eccentric lowering, 2x 15 reps; 2x 20 reps bil heel raiase with ball at heel (post tib); split isometric heel raise bil, 10 reps, 10 sec holds      Ankle Exercises: Seated   Other Seated Ankle Exercises  1x 15 reps short foot intrinsice (arch domming) Rt LE               PT Short Term Goals - 03/28/19 [id number]      PT SHORT TERM GOAL #1   Title  PT to be I in HEP to decrease pain level in Rt heel to allow pt to be able to stand for five minues without shifting wt to be able to complete self grooming tasks in comfort.    Time  2    Period  Weeks    Status  On-going    Target Date  04/09/19      PT SHORT TERM GOAL #2   Title  PT to be able to walk 1.5 miles without increased pain in RT heel/achilles.    Time  2    Period  Weeks    Status  On-going      PT SHORT TERM GOAL #3   Title  PT to be able to single leg stance for 40 seconds on his RT LE to demonstrate improved stability to decrease pain with walking activity.    Time  2    Period  Weeks    Status  On-going        PT Long Term Goals - 03/28/19 0905      PT LONG TERM GOAL #1  Title  PT to be able to stand with equal weight bearing for ten minutes or more to allow pt to be able to stand comfortably for socialization.    Time  4    Period  Weeks    Status   On-going      PT LONG TERM GOAL #2   Title  PT to be able to walk for 2.5 miles without experiencing increased pain to return to normal walking with his dog.    Time  4    Period  Weeks    Status  On-going      PT LONG TERM GOAL #3   Title  PT to be able to stand on his RT leg for 60 seconds for normal stabilty to allow pt to be able to walk on uneven ground in comfort.    Period  Weeks    Status  On-going            Plan - 04/02/19 0911    Clinical Impression Statement  Have not received order for ionto, called MD office and refaxed.  Continued wiht isometric as well as eccentric strengthening exercises for achilles tendon as well as stretches for ankle mobility.  Resumed SLS/vector stance for balance/stability and added slant board for gastroc stretches.  EOS with manual soft tissue mobilization to address tightness within the gastroc/soleus complex.  Did wear gloves during manual and avoided skin irritation/eczema on lateral ankle.  No reoprts of increased pain through session.    Personal Factors and Comorbidities  Time since onset of injury/illness/exacerbation    Examination-Activity Limitations  Stand;Squat;Stairs;Locomotion Level    Examination-Participation Restrictions  Community Activity;Other;Yard Work    Public affairs consultant  Low    Rehab Potential  Good    PT Frequency  2x / week    PT Duration  4 weeks    PT Treatment/Interventions  ADLs/Self Care Home Management;Patient/family education;Manual techniques;Iontophoresis 4mg /ml Dexamethasone;Passive range of motion;Balance training;Therapeutic exercise    PT Next Visit Plan  Continue slant board stretching for gastroc/soleus, plantar fascia stretch. Continue to increase reps for eccentric heel raise to increase load for R tendon. Perform SLS on compliant surface next session. Check to see if prescription for iontophoresis has returned.    PT Home  Exercise Plan  gastroc stretch, Single leg stance; eccentric lowering Rt heel raise       Patient will benefit from skilled therapeutic intervention in order to improve the following deficits and impairments:  Abnormal gait, Decreased activity tolerance, Decreased balance, Increased edema, Decreased strength, Decreased range of motion, Increased fascial restricitons, Pain  Visit Diagnosis: Pain in right ankle and joints of right foot  Difficulty in walking, not elsewhere classified  Other symptoms and signs involving the musculoskeletal system     Problem List Patient Active Problem List   Diagnosis Date Noted  . Hx of colonic polyps 08/07/2018  . Barrett's esophagus without dysplasia 05/10/2016  . Incisional hernia 02/08/2013  . GIST (gastrointestinal stromal tumor) of small bowel, malignant ([place]) 07/04/2011  . Status post small bowel resection 06/22/2011  . Abdominal mass 06/19/2011  . Essential hypertension 06/19/2011  . GI bleeding 06/17/2011  . Acute blood loss anemia 06/17/2011  . Syncope 06/17/2011  . Iron deficiency anemia 04/19/2011  . Schatzki's ring 01/03/2011  . Colon adenoma 01/03/2011  . Family hx of colon cancer 01/03/2011  . HELICOBACTER PYLORI GASTRITIS 12/22/2009  . BARRETTS ESOPHAGUS 12/22/2009   Myriam Jacobson  Dartha Lodge; CBIS [phone]  Aldona Lento 04/02/2019, 1:18 PM  [place] [address] [place], [place], [address] Phone: [phone]   Fax:  [phone]  Name: David Weiss MRN: [id number] Date of Birth: 1945/08/10

## 2019-04-04 ENCOUNTER — Telehealth (HOSPITAL_COMMUNITY): Payer: Self-pay | Admitting: Physical Therapy

## 2019-04-04 NOTE — Telephone Encounter (Signed)
New Ref  from MD for  Iontophoresis -scanned in chart. NF 04/04/2019

## 2019-04-09 ENCOUNTER — Ambulatory Visit (HOSPITAL_COMMUNITY): Payer: Medicare Other

## 2019-04-09 ENCOUNTER — Other Ambulatory Visit: Payer: Self-pay

## 2019-04-09 ENCOUNTER — Encounter (HOSPITAL_COMMUNITY): Payer: Self-pay

## 2019-04-09 DIAGNOSIS — R262 Difficulty in walking, not elsewhere classified: Secondary | ICD-10-CM | POA: Diagnosis not present

## 2019-04-09 DIAGNOSIS — R29898 Other symptoms and signs involving the musculoskeletal system: Secondary | ICD-10-CM

## 2019-04-09 DIAGNOSIS — M25571 Pain in right ankle and joints of right foot: Secondary | ICD-10-CM

## 2019-04-09 NOTE — Therapy (Signed)
[place], [place], [address] Phone: [phone]   Fax:  [phone]  Physical Therapy Treatment  Patient Details  Name: David Weiss MRN: [id number] Date of Birth: 05/12/1946 Referring Provider (PT): Landis Martins   Encounter Date: 04/09/2019  PT End of Session - 04/09/19 0908    Visit Number  5    Number of Visits  8    Date for PT Re-Evaluation  04/25/19    Authorization Type  medicare    Authorization - Visit Number  5    Authorization - Number of Visits  8    PT Start Time  0826    PT Stop Time  0908    PT Time Calculation (min)  42 min    Activity Tolerance  Patient tolerated treatment well    Behavior During Therapy  [facility] for tasks assessed/performed       Past Medical History:  Diagnosis Date  . Anemia   . Barrett's esophagus 5/11   EGD Dr. Phillips Climes Barrett's, multiple antral/bulbar erosins, D2 erosion sec NSAIDs  . Diverticulosis 5/11   Colonoscopy Dr Gala Romney  . Family hx of colon cancer   . GERD (gastroesophageal reflux disease)   . Helicobacter pylori gastritis 5/11  . HTN (hypertension)   . Hx of adenomatous colonic polyps 8/10   Colonoscopy Dr. Gala Romney  . Hyperlipemia   . Schatzki's ring 5/11   85F dilator    Past Surgical History:  Procedure Laterality Date  . BIOPSY N/A 08/30/2016   Procedure: BIOPSY;  Surgeon: Rogene Houston, MD;  [facility];  Service: Endoscopy;  Laterality: N/A;  esophageal  . CAPSULE ENDOSCOPY OF THE SMALL BOWEL  02/17/2011      . COLONOSCOPY  05/19/2011   Procedure: COLONOSCOPY;  Surgeon: Daneil Dolin, MD;  [facility];  Service: Endoscopy;  Laterality: N/A;  8:30  . COLONOSCOPY N/A 08/21/2013   Procedure: COLONOSCOPY;  Surgeon: Rogene Houston, MD;  [facility];  Service: Endoscopy;  Laterality: N/A;  930  . COLONOSCOPY N/A 02/26/2019   Procedure: COLONOSCOPY;  Surgeon: Rogene Houston, MD;  [facility];   Service: Endoscopy;  Laterality: N/A;  9:30-rescheduled to 5/20 @ 11:15 per Lelon Frohlich  . EAR CYST EXCISION N/A 03/25/2014   Procedure: EXCISION OF NEOPLASM NECK;  Surgeon: Jamesetta So, MD;  Location: [facility];  Service: General;  Laterality: N/A;  . ESOPHAGOGASTRODUODENOSCOPY  02/13/2011   Procedure: ESOPHAGOGASTRODUODENOSCOPY (EGD);  Surgeon: Daneil Dolin, MD;  [facility];  Service: Endoscopy;  Laterality: N/A;  . ESOPHAGOGASTRODUODENOSCOPY N/A 08/30/2016   Procedure: ESOPHAGOGASTRODUODENOSCOPY (EGD);  Surgeon: Rogene Houston, MD;  [facility];  Service: Endoscopy;  Laterality: N/A;  730  . excisional hernia  01/2013  . GIVENS CAPSULE STUDY  02/15/2011   Procedure: GIVENS CAPSULE STUDY;  Surgeon: Daneil Dolin, MD;  [facility];  Service: Endoscopy;  Laterality: N/A;  . POLYPECTOMY  02/26/2019   Procedure: POLYPECTOMY;  Surgeon: Rogene Houston, MD;  [facility];  Service: Endoscopy;;  colon  . small intestine partial removal  06/2011   due to malignant tumor  . VASECTOMY      There were no vitals filed for this visit.  Subjective Assessment - 04/09/19 0825    Subjective  Pt is feeling good today, reports tenderness with gait.  Able to stand without pain, just the movement, pain scale  3/10 today.    Pertinent History  Haglunds deformit B; Cancer remote, hx of eczuma on Rt heel    Patient Stated Goals  To be able to walk    Currently in Pain?  Yes    Pain Score  3     Pain Location  Ankle    Pain Orientation  Right    Pain Descriptors / Indicators  Aching    Pain Type  Chronic pain    Pain Radiating Towards  to achilles    Pain Onset  More than a month ago    Pain Frequency  Intermittent    Aggravating Factors   standing and walking    Pain Relieving Factors  ice in the beginning                       [facility] Adult PT Treatment/Exercise - 04/09/19 0001      Exercises   Exercises  Ankle      Modalities   Modalities   Iontophoresis      Iontophoresis   Type of Iontophoresis  Dexamethasone    Location  heel/achilles tendon    Dose  1/6    Time  Take home patch, instructed to remove in 6 hours      Manual Therapy   Manual Therapy  Joint mobilization;Soft tissue mobilization    Manual therapy comments  done seperate from all other aspects of treatment    Soft tissue mobilization  STM to Rt gastroc soleus complex in prone      Ankle Exercises: Standing   Vector Stance  Right;3 reps;5 seconds    Vector Stance Limitations  1 intermittent HHA    SLS  3x Rt 35", Lt 46"    Other Standing Ankle Exercises  Bil heel raise on 4" step with Rt eccentric lowering, 2x 15 reps; 2x 20 reps bil heel raiase with ball at heel (post tib); split isometric heel raise bil, 10 reps, 10 sec holds    Other Standing Ankle Exercises  tandem stance 1 set on solid surface then foam 3x 30" each      Ankle Exercises: Stretches   Plantar Fascia Stretch  3 reps;30 seconds    Soleus Stretch  3 reps;30 seconds    Gastroc Stretch  3 reps;30 seconds    Slant Board Stretch  3 reps;30 seconds             PT Education - 04/09/19 0934    Education Details  Educated on purpose of iontophoresis, instructed proper wear time and s/s to check skin following removal of patch    Person(s) Educated  Patient    Methods  Explanation    Comprehension  Verbalized understanding       PT Short Term Goals - 03/28/19 0905      PT SHORT TERM GOAL #1   Title  PT to be I in HEP to decrease pain level in Rt heel to allow pt to be able to stand for five minues without shifting wt to be able to complete self grooming tasks in comfort.    Time  2    Period  Weeks    Status  On-going    Target Date  04/09/19      PT SHORT TERM GOAL #2   Title  PT to be able to walk 1.5 miles without increased pain in RT heel/achilles.    Time  2    Period  Weeks  Status  On-going      PT SHORT TERM GOAL #3   Title  PT to be able to single leg stance for  40 seconds on his RT LE to demonstrate improved stability to decrease pain with walking activity.    Time  2    Period  Weeks    Status  On-going        PT Long Term Goals - 03/28/19 0905      PT LONG TERM GOAL #1   Title  PT to be able to stand with equal weight bearing for ten minutes or more to allow pt to be able to stand comfortably for socialization.    Time  4    Period  Weeks    Status  On-going      PT LONG TERM GOAL #2   Title  PT to be able to walk for 2.5 miles without experiencing increased pain to return to normal walking with his dog.    Time  4    Period  Weeks    Status  On-going      PT LONG TERM GOAL #3   Title  PT to be able to stand on his RT leg for 60 seconds for normal stabilty to allow pt to be able to walk on uneven ground in comfort.    Period  Weeks    Status  On-going            Plan - 04/09/19 0925    Clinical Impression Statement  Continued wiht isometric as well as eccentric strengthening exercises for achilles tendon, gastroc and soleus complex as well as stretches for ankle mobility and pain control.  Progressed balance wiht additional tandem stance and progressed to dynamic surface with intermittent HHA required for increased challenge.  Continued with manual soft tissue mobilizaiton to address tightness wihtin the gastroc/soleus complex.  Did receive signed order for ionto.  Pt educated on purpose of ionto, appropriate leave on time and s/s to check on skin following removal.    Personal Factors and Comorbidities  Time since onset of injury/illness/exacerbation    Examination-Activity Limitations  Stand;Squat;Stairs;Locomotion Level    Examination-Participation Restrictions  Community Activity;Other;Yard Work    Public affairs consultant  Low    Rehab Potential  Good    PT Frequency  2x / week    PT Duration  4 weeks    PT Treatment/Interventions  ADLs/Self Care Home  Management;Patient/family education;Manual techniques;Iontophoresis 4mg /ml Dexamethasone;Passive range of motion;Balance training;Therapeutic exercise    PT Next Visit Plan  Continue ionto for 5 consecutive treatments. Continue slant board stretching for gastroc/soleus, plantar fascia stretch. Continue to increase reps for eccentric heel raise to increase load for R tendon. Perform SLS on compliant surface next session.    PT Home Exercise Plan  gastroc stretch, Single leg stance; eccentric lowering Rt heel raise       Patient will benefit from skilled therapeutic intervention in order to improve the following deficits and impairments:  Abnormal gait, Decreased activity tolerance, Decreased balance, Increased edema, Decreased strength, Decreased range of motion, Increased fascial restricitons, Pain  Visit Diagnosis: Pain in right ankle and joints of right foot  Difficulty in walking, not elsewhere classified  Other symptoms and signs involving the musculoskeletal system     Problem List Patient Active Problem List   Diagnosis Date Noted  . Hx of colonic polyps 08/07/2018  . Barrett's esophagus without dysplasia 05/10/2016  .  Incisional hernia 02/08/2013  . GIST (gastrointestinal stromal tumor) of small bowel, malignant ([place]) 07/04/2011  . Status post small bowel resection 06/22/2011  . Abdominal mass 06/19/2011  . Essential hypertension 06/19/2011  . GI bleeding 06/17/2011  . Acute blood loss anemia 06/17/2011  . Syncope 06/17/2011  . Iron deficiency anemia 04/19/2011  . Schatzki's ring 01/03/2011  . Colon adenoma 01/03/2011  . Family hx of colon cancer 01/03/2011  . HELICOBACTER PYLORI GASTRITIS 12/22/2009  . [facility] ESOPHAGUS 12/22/2009   Ihor Austin, [place]; [place]  Aldona Lento 04/09/2019, 9:37 AM  [place], [place], [address] Phone: [phone]   Fax:  [phone]  Name: David Weiss MRN: [id number] Date of Birth: August 10, 1945

## 2019-04-11 ENCOUNTER — Ambulatory Visit (HOSPITAL_COMMUNITY): Payer: Medicare Other

## 2019-04-11 ENCOUNTER — Other Ambulatory Visit: Payer: Self-pay

## 2019-04-11 ENCOUNTER — Encounter (HOSPITAL_COMMUNITY): Payer: Self-pay

## 2019-04-11 DIAGNOSIS — R262 Difficulty in walking, not elsewhere classified: Secondary | ICD-10-CM

## 2019-04-11 DIAGNOSIS — R29898 Other symptoms and signs involving the musculoskeletal system: Secondary | ICD-10-CM | POA: Diagnosis not present

## 2019-04-11 DIAGNOSIS — M25571 Pain in right ankle and joints of right foot: Secondary | ICD-10-CM | POA: Diagnosis not present

## 2019-04-11 NOTE — Therapy (Signed)
[place], [place], [address] Phone: [phone]   Fax:  [phone]  Physical Therapy Treatment  Patient Details  Name: David Weiss MRN: [id number] Date of Birth: 1946-05-30 Referring Provider (PT): Landis Martins   Encounter Date: 04/11/2019  PT End of Session - 04/11/19 0827    Visit Number  6    Number of Visits  8    Date for PT Re-Evaluation  04/25/19    Authorization Type  medicare    Authorization - Visit Number  6    Authorization - Number of Visits  8    PT Start Time  0825    PT Stop Time  0911    PT Time Calculation (min)  46 min    Activity Tolerance  Patient tolerated treatment well    Behavior During Therapy  [facility] for tasks assessed/performed       Past Medical History:  Diagnosis Date  . Anemia   . Barrett's esophagus 5/11   EGD Dr. Phillips Climes Barrett's, multiple antral/bulbar erosins, D2 erosion sec NSAIDs  . Diverticulosis 5/11   Colonoscopy Dr Gala Romney  . Family hx of colon cancer   . GERD (gastroesophageal reflux disease)   . Helicobacter pylori gastritis 5/11  . HTN (hypertension)   . Hx of adenomatous colonic polyps 8/10   Colonoscopy Dr. Gala Romney  . Hyperlipemia   . Schatzki's ring 5/11   70F dilator    Past Surgical History:  Procedure Laterality Date  . BIOPSY N/A 08/30/2016   Procedure: BIOPSY;  Surgeon: Rogene Houston, MD;  [facility];  Service: Endoscopy;  Laterality: N/A;  esophageal  . CAPSULE ENDOSCOPY OF THE SMALL BOWEL  02/17/2011      . COLONOSCOPY  05/19/2011   Procedure: COLONOSCOPY;  Surgeon: Daneil Dolin, MD;  [facility];  Service: Endoscopy;  Laterality: N/A;  8:30  . COLONOSCOPY N/A 08/21/2013   Procedure: COLONOSCOPY;  Surgeon: Rogene Houston, MD;  [facility];  Service: Endoscopy;  Laterality: N/A;  930  . COLONOSCOPY N/A 02/26/2019   Procedure: COLONOSCOPY;  Surgeon: Rogene Houston, MD;  [facility];   Service: Endoscopy;  Laterality: N/A;  9:30-rescheduled to 5/20 @ 11:15 per Lelon Frohlich  . EAR CYST EXCISION N/A 03/25/2014   Procedure: EXCISION OF NEOPLASM NECK;  Surgeon: Jamesetta So, MD;  [facility]: [facility];  Service: General;  Laterality: N/A;  . ESOPHAGOGASTRODUODENOSCOPY  02/13/2011   Procedure: ESOPHAGOGASTRODUODENOSCOPY (EGD);  Surgeon: Daneil Dolin, MD;  [facility];  Service: Endoscopy;  Laterality: N/A;  . ESOPHAGOGASTRODUODENOSCOPY N/A 08/30/2016   Procedure: ESOPHAGOGASTRODUODENOSCOPY (EGD);  Surgeon: Rogene Houston, MD;  [facility];  Service: Endoscopy;  Laterality: N/A;  730  . excisional hernia  01/2013  . GIVENS CAPSULE STUDY  02/15/2011   Procedure: GIVENS CAPSULE STUDY;  Surgeon: Daneil Dolin, MD;  [facility];  Service: Endoscopy;  Laterality: N/A;  . POLYPECTOMY  02/26/2019   Procedure: POLYPECTOMY;  Surgeon: Rogene Houston, MD;  [facility];  Service: Endoscopy;;  colon  . small intestine partial removal  06/2011   due to malignant tumor  . VASECTOMY      There were no vitals filed for this visit.  Subjective Assessment - 04/11/19 0823    Subjective  Pt reports positive results following first dose of ionto, no irritation to skin following removal of patch.  No reports  of pain currently    Pertinent History  Haglunds deformit B; Cancer remote, hx of eczuma on Rt heel    Patient Stated Goals  To be able to walk    Currently in Pain?  No/denies                       [facility] Adult PT Treatment/Exercise - 04/11/19 0001      Exercises   Exercises  Ankle      Modalities   Modalities  Iontophoresis      Iontophoresis   Type of Iontophoresis  Dexamethasone    Location  heel/achilles tendon    Dose  2/6    Time  Take home patch, instructed to remove in 6 hours      Manual Therapy   Manual Therapy  Joint mobilization;Soft tissue mobilization    Manual therapy comments  done seperate from all other aspects  of treatment    Joint Mobilization  AP glide to Rt talocrural joint, 3x 30-45 seconds, grade III    Soft tissue mobilization  STM to Rt gastroc soleus complex in prone      Ankle Exercises: Stretches   Plantar Fascia Stretch  3 reps;30 seconds    Gastroc Stretch  3 reps;30 seconds    Slant Board Stretch  3 reps;30 seconds      Ankle Exercises: Standing   Vector Stance  Right;Left;5 reps;10 seconds    Vector Stance Limitations  1 intermittent HHA    SLS  Rt 60", LT 46"    Heel Raises  Both;Right;10 reps;3 seconds    Heel Raises Limitations  up with both, down Rt only 3" holds    Other Standing Ankle Exercises  Bil heel raise on 4" step with Rt eccentric lowering, 2x 15 reps; 2x 20 reps bil heel raiase with ball at heel (post tib); split isometric heel raise bil, 10 reps, 10 sec holds    Other Standing Ankle Exercises  tandem stance on foam 3x 30"               PT Short Term Goals - 03/28/19 0905      PT SHORT TERM GOAL #1   Title  PT to be I in HEP to decrease pain level in Rt heel to allow pt to be able to stand for five minues without shifting wt to be able to complete self grooming tasks in comfort.    Time  2    Period  Weeks    Status  On-going    Target Date  04/09/19      PT SHORT TERM GOAL #2   Title  PT to be able to walk 1.5 miles without increased pain in RT heel/achilles.    Time  2    Period  Weeks    Status  On-going      PT SHORT TERM GOAL #3   Title  PT to be able to single leg stance for 40 seconds on his RT LE to demonstrate improved stability to decrease pain with walking activity.    Time  2    Period  Weeks    Status  On-going        PT Long Term Goals - 03/28/19 0905      PT LONG TERM GOAL #1   Title  PT to be able to stand with equal weight bearing for ten minutes or more to allow pt to be able to stand comfortably for  socialization.    Time  4    Period  Weeks    Status  On-going      PT LONG TERM GOAL #2   Title  PT to be able to  walk for 2.5 miles without experiencing increased pain to return to normal walking with his dog.    Time  4    Period  Weeks    Status  On-going      PT LONG TERM GOAL #3   Title  PT to be able to stand on his RT leg for 60 seconds for normal stabilty to allow pt to be able to walk on uneven ground in comfort.    Period  Weeks    Status  On-going            Plan - 04/11/19 0935    Clinical Impression Statement  Pt progressing well with reports of pain free at entrance, improvements with SLS and able to progress with exercises this session.  Added both up and Rt only down with eccentric heel raises wiht good control noted.  EOS with manual soft tissue mobilizaiton to address tightness within gastroc/soleux complex.  Placed ionto patch  #2/6 on heel and reviewed wear time and to check skin upon removal, verbalized understanding.    Personal Factors and Comorbidities  Time since onset of injury/illness/exacerbation    Examination-Activity Limitations  Stand;Squat;Stairs;Locomotion Level    Examination-Participation Restrictions  Community Activity;Other;Yard Work    Public affairs consultant  Low    Rehab Potential  Good    PT Frequency  2x / week    PT Duration  4 weeks    PT Treatment/Interventions  ADLs/Self Care Home Management;Patient/family education;Manual techniques;Iontophoresis 4mg /ml Dexamethasone;Passive range of motion;Balance training;Therapeutic exercise    PT Next Visit Plan  Next session assess current walking program at home, able to increase steps?  Encourage to progress slowly.  Continue ionto for 4 consecutive treatments. Continue slant board stretching for gastroc/soleus, plantar fascia stretch. Continue to increase reps for eccentric heel raise to increase load for R tendon. Perform SLS on compliant surface next session.    PT Home Exercise Plan  gastroc stretch, Single leg stance; eccentric lowering  Rt heel raise       Patient will benefit from skilled therapeutic intervention in order to improve the following deficits and impairments:  Abnormal gait, Decreased activity tolerance, Decreased balance, Increased edema, Decreased strength, Decreased range of motion, Increased fascial restricitons, Pain  Visit Diagnosis: Other symptoms and signs involving the musculoskeletal system  Difficulty in walking, not elsewhere classified  Pain in right ankle and joints of right foot     Problem List Patient Active Problem List   Diagnosis Date Noted  . Hx of colonic polyps 08/07/2018  . Barrett's esophagus without dysplasia 05/10/2016  . Incisional hernia 02/08/2013  . GIST (gastrointestinal stromal tumor) of small bowel, malignant ([place]) 07/04/2011  . Status post small bowel resection 06/22/2011  . Abdominal mass 06/19/2011  . Essential hypertension 06/19/2011  . GI bleeding 06/17/2011  . Acute blood loss anemia 06/17/2011  . Syncope 06/17/2011  . Iron deficiency anemia 04/19/2011  . Schatzki's ring 01/03/2011  . Colon adenoma 01/03/2011  . Family hx of colon cancer 01/03/2011  . HELICOBACTER PYLORI GASTRITIS 12/22/2009  . [facility] ESOPHAGUS 12/22/2009   Ihor Austin, LPTA; [place]  Aldona Lento 04/11/2019, [place] [address]  [place], [place], [address] Phone: [phone]   Fax:  [phone]  Name: David Weiss MRN: [id number] Date of Birth: 1945/11/25

## 2019-04-16 ENCOUNTER — Other Ambulatory Visit: Payer: Self-pay

## 2019-04-16 ENCOUNTER — Encounter (HOSPITAL_COMMUNITY): Payer: Self-pay

## 2019-04-16 ENCOUNTER — Ambulatory Visit (HOSPITAL_COMMUNITY): Payer: Medicare Other

## 2019-04-16 DIAGNOSIS — R29898 Other symptoms and signs involving the musculoskeletal system: Secondary | ICD-10-CM | POA: Diagnosis not present

## 2019-04-16 DIAGNOSIS — R262 Difficulty in walking, not elsewhere classified: Secondary | ICD-10-CM

## 2019-04-16 DIAGNOSIS — M25571 Pain in right ankle and joints of right foot: Secondary | ICD-10-CM | POA: Diagnosis not present

## 2019-04-16 NOTE — Therapy (Signed)
[place], [place], [address] Phone: [phone]   Fax:  [phone]  Physical Therapy Treatment  Patient Details  Name: David Weiss MRN: [id number] Date of Birth: October 12, 1945 Referring Provider (PT): Landis Martins   Encounter Date: 04/16/2019  PT End of Session - 04/16/19 0820    Visit Number  7    Number of Visits  8    Date for PT Re-Evaluation  04/25/19    Authorization Type  medicare    Authorization - Visit Number  7    Authorization - Number of Visits  8    PT Start Time  0815    PT Stop Time  0900    PT Time Calculation (min)  45 min    Activity Tolerance  Patient tolerated treatment well    Behavior During Therapy  [facility] for tasks assessed/performed       Past Medical History:  Diagnosis Date  . Anemia   . Barrett's esophagus 5/11   EGD Dr. Phillips Climes Barrett's, multiple antral/bulbar erosins, D2 erosion sec NSAIDs  . Diverticulosis 5/11   Colonoscopy Dr Gala Romney  . Family hx of colon cancer   . GERD (gastroesophageal reflux disease)   . Helicobacter pylori gastritis 5/11  . HTN (hypertension)   . Hx of adenomatous colonic polyps 8/10   Colonoscopy Dr. Gala Romney  . Hyperlipemia   . Schatzki's ring 5/11   45F dilator    Past Surgical History:  Procedure Laterality Date  . BIOPSY N/A 08/30/2016   Procedure: BIOPSY;  Surgeon: Rogene Houston, MD;  [facility];  Service: Endoscopy;  Laterality: N/A;  esophageal  . CAPSULE ENDOSCOPY OF THE SMALL BOWEL  02/17/2011      . COLONOSCOPY  05/19/2011   Procedure: COLONOSCOPY;  Surgeon: Daneil Dolin, MD;  [facility];  Service: Endoscopy;  Laterality: N/A;  8:30  . COLONOSCOPY N/A 08/21/2013   Procedure: COLONOSCOPY;  Surgeon: Rogene Houston, MD;  [facility];  Service: Endoscopy;  Laterality: N/A;  930  . COLONOSCOPY N/A 02/26/2019   Procedure: COLONOSCOPY;  Surgeon: Rogene Houston, MD;  [facility];   Service: Endoscopy;  Laterality: N/A;  9:30-rescheduled to 5/20 @ 11:15 per Lelon Frohlich  . EAR CYST EXCISION N/A 03/25/2014   Procedure: EXCISION OF NEOPLASM NECK;  Surgeon: Jamesetta So, MD;  [facility]: [facility];  Service: General;  Laterality: N/A;  . ESOPHAGOGASTRODUODENOSCOPY  02/13/2011   Procedure: ESOPHAGOGASTRODUODENOSCOPY (EGD);  Surgeon: Daneil Dolin, MD;  [facility];  Service: Endoscopy;  Laterality: N/A;  . ESOPHAGOGASTRODUODENOSCOPY N/A 08/30/2016   Procedure: ESOPHAGOGASTRODUODENOSCOPY (EGD);  Surgeon: Rogene Houston, MD;  [facility];  Service: Endoscopy;  Laterality: N/A;  730  . excisional hernia  01/2013  . GIVENS CAPSULE STUDY  02/15/2011   Procedure: GIVENS CAPSULE STUDY;  Surgeon: Daneil Dolin, MD;  [facility];  Service: Endoscopy;  Laterality: N/A;  . POLYPECTOMY  02/26/2019   Procedure: POLYPECTOMY;  Surgeon: Rogene Houston, MD;  [facility];  Service: Endoscopy;;  colon  . small intestine partial removal  06/2011   due to malignant tumor  . VASECTOMY      There were no vitals filed for this visit.  Subjective Assessment - 04/16/19 0809    Subjective  Pt reports he is feeling good currently.  Continues to c/o ache in the morning time though reports decrease in  intensity and duration.  Found that tennis shoes do better than flat shoes.    Pertinent History  Haglunds deformit B; Cancer remote, hx of eczuma on Rt heel    Patient Stated Goals  To be able to walk    Currently in Pain?  No/denies                       [facility] Adult PT Treatment/Exercise - 04/16/19 0001      Exercises   Exercises  Ankle      Modalities   Modalities  Iontophoresis      Iontophoresis   Type of Iontophoresis  Dexamethasone    Location  heel/achilles tendon    Dose  3/6    Time  Take home patch, instructed to remove in 6 hours      Manual Therapy   Manual Therapy  Joint mobilization;Soft tissue mobilization    Manual therapy  comments  done seperate from all other aspects of treatment    Joint Mobilization  AP glide to Rt talocrural joint, 3x 30-45 seconds, grade III    Soft tissue mobilization  STM to Rt gastroc soleus complex in prone      Ankle Exercises: Stretches   Plantar Fascia Stretch  2 reps;30 seconds    Soleus Stretch  2 reps;30 seconds    Slant Board Stretch  3 reps;30 seconds      Ankle Exercises: Standing   Vector Stance  Right;Left;5 reps;10 seconds    Vector Stance Limitations  1 intermittent HHA    Heel Raises  Both;Right;10 reps;3 seconds   2 sets   Heel Raises Limitations  up with both, down Rt only 3" holds    Other Standing Ankle Exercises  Bil heel raise on 4" step with Rt eccentric lowering, 2x 15 reps; 2x 20 reps bil heel raiase with ball at heel (post tib); split isometric heel raise bil, 10 reps, 10 sec holds    Other Standing Ankle Exercises  tandem stance on foam 2x 30"; tandem gait 2RT             PT Education - 04/16/19 0913    Education Details  Discussed walking progam, encouraged to increase walking slow progression.  Pt reports ability to walk 1/2 mile without difficulty-walking dog every morning.    Person(s) Educated  Patient    Methods  Explanation    Comprehension  Verbalized understanding       PT Short Term Goals - 03/28/19 0905      PT SHORT TERM GOAL #1   Title  PT to be I in HEP to decrease pain level in Rt heel to allow pt to be able to stand for five minues without shifting wt to be able to complete self grooming tasks in comfort.    Time  2    Period  Weeks    Status  On-going    Target Date  04/09/19      PT SHORT TERM GOAL #2   Title  PT to be able to walk 1.5 miles without increased pain in RT heel/achilles.    Time  2    Period  Weeks    Status  On-going      PT SHORT TERM GOAL #3   Title  PT to be able to single leg stance for 40 seconds on his RT LE to demonstrate improved stability to decrease pain with walking activity.    Time  2  Period  Weeks    Status  On-going        PT Long Term Goals - 03/28/19 0905      PT LONG TERM GOAL #1   Title  PT to be able to stand with equal weight bearing for ten minutes or more to allow pt to be able to stand comfortably for socialization.    Time  4    Period  Weeks    Status  On-going      PT LONG TERM GOAL #2   Title  PT to be able to walk for 2.5 miles without experiencing increased pain to return to normal walking with his dog.    Time  4    Period  Weeks    Status  On-going      PT LONG TERM GOAL #3   Title  PT to be able to stand on his RT leg for 60 seconds for normal stabilty to allow pt to be able to walk on uneven ground in comfort.    Period  Weeks    Status  On-going            Plan - 04/16/19 0906    Clinical Impression Statement  Continued with eccentric strengthening for achillies tendon/gastroc complex strengthening.  Pt presents with good stability during SLS lowering for eccentric strengthening, no reports of pain.  Progressed to tandem gait for balance with some unsteadiness though able to resolve independently wiht min guard.  Continues with manual soft tissue mobilizaiton to address tightness with gastroc/soleus complex and ionto #3/6 placed on heel.  Reviewed wear time and check skin upon removal of patch wiht verbalized understanding.    Personal Factors and Comorbidities  Time since onset of injury/illness/exacerbation    Examination-Activity Limitations  Stand;Squat;Stairs;Locomotion Level    Examination-Participation Restrictions  Community Activity;Other;Yard Work    Public affairs consultant  Low    Rehab Potential  Good    PT Frequency  2x / week    PT Duration  4 weeks    PT Treatment/Interventions  ADLs/Self Care Home Management;Patient/family education;Manual techniques;Iontophoresis 4mg /ml Dexamethasone;Passive range of motion;Balance training;Therapeutic exercise     PT Next Visit Plan  Reassess next session.  Continue ionto for 3 consecutive treatments.    PT Home Exercise Plan  gastroc stretch, Single leg stance; eccentric lowering Rt heel raise       Patient will benefit from skilled therapeutic intervention in order to improve the following deficits and impairments:  Abnormal gait, Decreased activity tolerance, Decreased balance, Increased edema, Decreased strength, Decreased range of motion, Increased fascial restricitons, Pain  Visit Diagnosis: Other symptoms and signs involving the musculoskeletal system  Difficulty in walking, not elsewhere classified  Pain in right ankle and joints of right foot     Problem List Patient Active Problem List   Diagnosis Date Noted  . Hx of colonic polyps 08/07/2018  . Barrett's esophagus without dysplasia 05/10/2016  . Incisional hernia 02/08/2013  . GIST (gastrointestinal stromal tumor) of small bowel, malignant ([place]) 07/04/2011  . Status post small bowel resection 06/22/2011  . Abdominal mass 06/19/2011  . Essential hypertension 06/19/2011  . GI bleeding 06/17/2011  . Acute blood loss anemia 06/17/2011  . Syncope 06/17/2011  . Iron deficiency anemia 04/19/2011  . Schatzki's ring 01/03/2011  . Colon adenoma 01/03/2011  . Family hx of colon cancer 01/03/2011  . HELICOBACTER PYLORI GASTRITIS 12/22/2009  . BARRETTS ESOPHAGUS 12/22/2009  [address] LPTA; [place]  Aldona Lento 04/16/2019, [place], [place], [address] Phone: [phone]   Fax:  [phone]  Name: David Weiss MRN: [id number] Date of Birth: September 23, 1945

## 2019-04-18 ENCOUNTER — Other Ambulatory Visit: Payer: Self-pay

## 2019-04-18 ENCOUNTER — Encounter (HOSPITAL_COMMUNITY): Payer: Self-pay | Admitting: Physical Therapy

## 2019-04-18 ENCOUNTER — Ambulatory Visit (HOSPITAL_COMMUNITY): Payer: Medicare Other | Admitting: Physical Therapy

## 2019-04-18 DIAGNOSIS — M25571 Pain in right ankle and joints of right foot: Secondary | ICD-10-CM | POA: Diagnosis not present

## 2019-04-18 DIAGNOSIS — R29898 Other symptoms and signs involving the musculoskeletal system: Secondary | ICD-10-CM

## 2019-04-18 DIAGNOSIS — R262 Difficulty in walking, not elsewhere classified: Secondary | ICD-10-CM

## 2019-04-18 NOTE — Therapy (Signed)
[place] [address] [place], [place], [address] Phone: [phone]   Fax:  [phone]  Physical Therapy Treatment / Progress Note  Patient Details  Name: David Weiss MRN: [id number] Date of Birth: 26-Feb-1946 Referring Provider (PT): Landis Martins   Encounter Date: 04/18/2019   Progress Note Reporting Period 03/26/19 to 04/18/19  See note below for Objective Data and Assessment of Progress/Goals.       PT End of Session - 04/18/19 0951    Visit Number  8    Number of Visits  14    Date for PT Re-Evaluation  05/09/19    Authorization Type  medicare    Authorization - Visit Number  0    Authorization - Number of Visits  6    PT Start Time  0815    PT Stop Time  0905    PT Time Calculation (min)  50 min    Activity Tolerance  Patient tolerated treatment well    Behavior During Therapy  [facility] for tasks assessed/performed       Past Medical History:  Diagnosis Date  . Anemia   . Barrett's esophagus 5/11   EGD Dr. Phillips Climes Barrett's, multiple antral/bulbar erosins, D2 erosion sec NSAIDs  . Diverticulosis 5/11   Colonoscopy Dr Gala Romney  . Family hx of colon cancer   . GERD (gastroesophageal reflux disease)   . Helicobacter pylori gastritis 5/11  . HTN (hypertension)   . Hx of adenomatous colonic polyps 8/10   Colonoscopy Dr. Gala Romney  . Hyperlipemia   . Schatzki's ring 5/11   46F dilator    Past Surgical History:  Procedure Laterality Date  . BIOPSY N/A 08/30/2016   Procedure: BIOPSY;  Surgeon: Rogene Houston, MD;  [facility];  Service: Endoscopy;  Laterality: N/A;  esophageal  . CAPSULE ENDOSCOPY OF THE SMALL BOWEL  02/17/2011      . COLONOSCOPY  05/19/2011   Procedure: COLONOSCOPY;  Surgeon: Daneil Dolin, MD;  [facility];  Service: Endoscopy;  Laterality: N/A;  8:30  . COLONOSCOPY N/A 08/21/2013   Procedure: COLONOSCOPY;  Surgeon: Rogene Houston, MD;  [facility];   Service: Endoscopy;  Laterality: N/A;  930  . COLONOSCOPY N/A 02/26/2019   Procedure: COLONOSCOPY;  Surgeon: Rogene Houston, MD;  [facility];  Service: Endoscopy;  Laterality: N/A;  9:30-rescheduled to 5/20 @ 11:15 per Lelon Frohlich  . EAR CYST EXCISION N/A 03/25/2014   Procedure: EXCISION OF NEOPLASM NECK;  Surgeon: Jamesetta So, MD;  [facility]: [facility];  Service: General;  Laterality: N/A;  . ESOPHAGOGASTRODUODENOSCOPY  02/13/2011   Procedure: ESOPHAGOGASTRODUODENOSCOPY (EGD);  Surgeon: Daneil Dolin, MD;  [facility];  Service: Endoscopy;  Laterality: N/A;  . ESOPHAGOGASTRODUODENOSCOPY N/A 08/30/2016   Procedure: ESOPHAGOGASTRODUODENOSCOPY (EGD);  Surgeon: Rogene Houston, MD;  [facility];  Service: Endoscopy;  Laterality: N/A;  730  . excisional hernia  01/2013  . GIVENS CAPSULE STUDY  02/15/2011   Procedure: GIVENS CAPSULE STUDY;  Surgeon: Daneil Dolin, MD;  [facility];  Service: Endoscopy;  Laterality: N/A;  . POLYPECTOMY  02/26/2019   Procedure: POLYPECTOMY;  Surgeon: Rogene Houston, MD;  [facility];  Service: Endoscopy;;  colon  . small intestine partial removal  06/2011   due to malignant tumor  . VASECTOMY      There were no vitals filed for this visit.  Subjective Assessment -  04/18/19 0818    Subjective  Patient reported that he feels like he's made progress in therapy. Reporting no pain now. Reported being able to walk 1.5 miles currently, but not necessarily 2.5 miles.    Pertinent History  Haglunds deformit B; Cancer remote, hx of eczuma on Rt heel    Patient Stated Goals  To be able to walk    Currently in Pain?  No/denies         [facility] PT Assessment - 04/18/19 0001      Assessment   Medical Diagnosis  Rt achilles tendonitis    Referring Provider (PT)  Landis Martins      Prior Function   Level of Independence  Independent      Cognition   Overall Cognitive Status  Within Functional Limits for tasks assessed       Observation/Other Assessments   Focus on Therapeutic Outcomes (FOTO)   37% limited   was 51% limited     Single Leg Stance   Comments  RT: 60''      AROM   Right Ankle Dorsiflexion  4   was 4   Right Ankle Plantar Flexion  65   was 75   Right Ankle Inversion  20   was 18   Right Ankle Eversion  15   was 12   Left Ankle Dorsiflexion  6   was 8   Left Ankle Plantar Flexion  62   was 73   Left Ankle Inversion  25   was 25   Left Ankle Eversion  12   was 12     Strength   Right Ankle Plantar Flexion  4+/5    Right Ankle Inversion  5/5    Right Ankle Eversion  5/5    Left Ankle Dorsiflexion  5/5    Left Ankle Plantar Flexion  5/5    Left Ankle Inversion  5/5    Left Ankle Eversion  5/5      Palpation   Palpation comment  very tight achilles and gastroc mm                    [facility] Adult PT Treatment/Exercise - 04/18/19 0001      Modalities   Modalities  Iontophoresis      Iontophoresis   Type of Iontophoresis  Dexamethasone    Location  heel/achilles tendon    Dose  4/6    Time  Take home patch, instructed to remove in 6 hours or if experiencing any adverse affects      Ankle Exercises: Stretches   Plantar Fascia Stretch  2 reps;30 seconds    Slant Board Stretch  3 reps;30 seconds      Ankle Exercises: Standing   Heel Raises  Both;Right;10 reps;3 seconds   2 sets   Heel Raises Limitations  up with both, down Rt only 3" holds    Other Standing Ankle Exercises  Bil heel raise on 4" step with Rt eccentric lowering, 2x 15 reps             PT Education - 04/18/19 0951    Education Details  Discussed continuing POC, performing stretching in the morning rather than afternoon.    Person(s) Educated  Patient    Methods  Explanation    Comprehension  Verbalized understanding       PT Short Term Goals - 04/18/19 1407      PT SHORT TERM GOAL #1   Title  PT  to be I in HEP to decrease pain level in Rt heel to allow pt to be able to stand  for five minues without shifting wt to be able to complete self grooming tasks in comfort.    Time  2    Period  Weeks    Status  Achieved    Target Date  04/09/19      PT SHORT TERM GOAL #2   Title  PT to be able to walk 1.5 miles without increased pain in RT heel/achilles.    Time  2    Period  Weeks    Status  Achieved      PT SHORT TERM GOAL #3   Title  PT to be able to single leg stance for 40 seconds on his RT LE to demonstrate improved stability to decrease pain with walking activity.    Time  2    Period  Weeks    Status  Achieved        PT Long Term Goals - 04/18/19 1409      PT LONG TERM GOAL #1   Title  PT to be able to stand with equal weight bearing for ten minutes or more to allow pt to be able to stand comfortably for socialization.    Time  4    Period  Weeks    Status  Achieved      PT LONG TERM GOAL #2   Title  PT to be able to walk for 2.5 miles without experiencing increased pain to return to normal walking with his dog.    Time  3    Period  Weeks    Status  On-going    Target Date  05/09/19      PT LONG TERM GOAL #3   Title  PT to be able to stand on his RT leg for 60 seconds for normal stabilty to allow pt to be able to walk on uneven ground in comfort.    Period  Weeks    Status  Achieved      PT LONG TERM GOAL #4   Title  Patient will report ability to perform sit to stand transfer in the morning with minimal to no difficulty.    Baseline  04/18/19: Patient reported moderate difficulty with this.    Time  3    Period  Weeks    Status  New    Target Date  05/09/19            Plan - 04/18/19 1415    Clinical Impression Statement  This session performed re-assessment of patient's progress towards goals. Patient achieved all short term goals. Patient achieved 2 out of 4 long term goals. Patient has demonstrated improvements in functional mobility, balance, and decreased reports of pain. Patient is continuing to have difficulty with pain  particularly in the morning, with sit to stand transfers, as well as with performing his baseline level of ambulation. Did educate patient to perform calf stretches in the morning initially as well as throughout the day. Patient had previously ambulated 6 miles daily. Patient would benefit from continued skilled physical therapy to continue progressing towards prior level of function and improving overall quality of life. Plan to extend patient for 3 additional weeks twice a week as needed.    Personal Factors and Comorbidities  Time since onset of injury/illness/exacerbation    Examination-Activity Limitations  Stand;Squat;Stairs;Locomotion Level    Examination-Participation Restrictions  Community Activity;Other;[employer]alla [employer] Work  Stability/Clinical Decision Making  Evolving/Moderate complexity    Rehab Potential  Good    PT Frequency  2x / week    PT Duration  3 weeks    PT Treatment/Interventions  ADLs/Self Care Home Management;Patient/family education;Manual techniques;Iontophoresis 4mg /ml Dexamethasone;Passive range of motion;Balance training;Therapeutic exercise    PT Next Visit Plan  Continue eccentric work focusing more on endurance. Consider barefoot assessment and performance of exercises. Consider crossfriction massage to achilles tendon in addition to continuing joint mobilizations as well as soft tissue mobilization to soleus and gastroc. Continue ionto for 2 consecutive treatments.    PT Home Exercise Plan  gastroc stretch, Single leg stance; eccentric lowering Rt heel raise    Consulted and Agree with Plan of Care  Patient       Patient will benefit from skilled therapeutic intervention in order to improve the following deficits and impairments:  Abnormal gait, Decreased activity tolerance, Decreased balance, Increased edema, Decreased strength, Decreased range of motion, Increased fascial restricitons, Pain  Visit Diagnosis: Pain in right ankle and joints of right foot  Other symptoms  and signs involving the musculoskeletal system  Difficulty in walking, not elsewhere classified     Problem List Patient Active Problem List   Diagnosis Date Noted  . Hx of colonic polyps 08/07/2018  . Barrett's esophagus without dysplasia 05/10/2016  . Incisional hernia 02/08/2013  . GIST (gastrointestinal stromal tumor) of small bowel, malignant ([place]) 07/04/2011  . Status post small bowel resection 06/22/2011  . Abdominal mass 06/19/2011  . Essential hypertension 06/19/2011  . GI bleeding 06/17/2011  . Acute blood loss anemia 06/17/2011  . Syncope 06/17/2011  . Iron deficiency anemia 04/19/2011  . Schatzki's ring 01/03/2011  . Colon adenoma 01/03/2011  . Family hx of colon cancer 01/03/2011  . HELICOBACTER PYLORI GASTRITIS 12/22/2009  . BARRETTS ESOPHAGUS 12/22/2009   Clarene Critchley PT, DPT 2:20 PM, 04/18/19 [place], [place], [address] Phone: [phone]   Fax:  [phone]  Name: David Weiss MRN: [id number] Date of Birth: 05-21-1946

## 2019-04-23 ENCOUNTER — Ambulatory Visit (HOSPITAL_COMMUNITY): Payer: Medicare Other | Admitting: Physical Therapy

## 2019-04-23 ENCOUNTER — Encounter (HOSPITAL_COMMUNITY): Payer: Self-pay | Admitting: Physical Therapy

## 2019-04-23 ENCOUNTER — Other Ambulatory Visit: Payer: Self-pay

## 2019-04-23 DIAGNOSIS — R262 Difficulty in walking, not elsewhere classified: Secondary | ICD-10-CM

## 2019-04-23 DIAGNOSIS — R29898 Other symptoms and signs involving the musculoskeletal system: Secondary | ICD-10-CM

## 2019-04-23 DIAGNOSIS — M25571 Pain in right ankle and joints of right foot: Secondary | ICD-10-CM | POA: Diagnosis not present

## 2019-04-23 NOTE — Therapy (Signed)
[place], [place], [address] Phone: [phone]   Fax:  [phone]  Physical Therapy Treatment  Patient Details  Name: David Weiss MRN: [id number] Date of Birth: 1945/10/30 Referring Provider (PT): Landis Martins   Encounter Date: 04/23/2019  PT End of Session - 04/23/19 0820    Visit Number  9    Number of Visits  14    Date for PT Re-Evaluation  05/09/19    Authorization Type  medicare    Authorization - Visit Number  1    Authorization - Number of Visits  6    PT Start Time  0817    PT Stop Time  0855    PT Time Calculation (min)  38 min    Activity Tolerance  Patient tolerated treatment well    Behavior During Therapy  [facility] for tasks assessed/performed       Past Medical History:  Diagnosis Date  . Anemia   . Barrett's esophagus 5/11   EGD Dr. Phillips Climes Barrett's, multiple antral/bulbar erosins, D2 erosion sec NSAIDs  . Diverticulosis 5/11   Colonoscopy Dr Gala Romney  . Family hx of colon cancer   . GERD (gastroesophageal reflux disease)   . Helicobacter pylori gastritis 5/11  . HTN (hypertension)   . Hx of adenomatous colonic polyps 8/10   Colonoscopy Dr. Gala Romney  . Hyperlipemia   . Schatzki's ring 5/11   38F dilator    Past Surgical History:  Procedure Laterality Date  . BIOPSY N/A 08/30/2016   Procedure: BIOPSY;  Surgeon: Rogene Houston, MD;  [facility];  Service: Endoscopy;  Laterality: N/A;  esophageal  . CAPSULE ENDOSCOPY OF THE SMALL BOWEL  02/17/2011      . COLONOSCOPY  05/19/2011   Procedure: COLONOSCOPY;  Surgeon: Daneil Dolin, MD;  [facility];  Service: Endoscopy;  Laterality: N/A;  8:30  . COLONOSCOPY N/A 08/21/2013   Procedure: COLONOSCOPY;  Surgeon: Rogene Houston, MD;  [facility];  Service: Endoscopy;  Laterality: N/A;  930  . COLONOSCOPY N/A 02/26/2019   Procedure: COLONOSCOPY;  Surgeon: Rogene Houston, MD;  [facility];   Service: Endoscopy;  Laterality: N/A;  9:30-rescheduled to 5/20 @ 11:15 per Lelon Frohlich  . EAR CYST EXCISION N/A 03/25/2014   Procedure: EXCISION OF NEOPLASM NECK;  Surgeon: Jamesetta So, MD;  Location: [facility];  Service: General;  Laterality: N/A;  . ESOPHAGOGASTRODUODENOSCOPY  02/13/2011   Procedure: ESOPHAGOGASTRODUODENOSCOPY (EGD);  Surgeon: Daneil Dolin, MD;  [facility];  Service: Endoscopy;  Laterality: N/A;  . ESOPHAGOGASTRODUODENOSCOPY N/A 08/30/2016   Procedure: ESOPHAGOGASTRODUODENOSCOPY (EGD);  Surgeon: Rogene Houston, MD;  [facility];  Service: Endoscopy;  Laterality: N/A;  730  . excisional hernia  01/2013  . GIVENS CAPSULE STUDY  02/15/2011   Procedure: GIVENS CAPSULE STUDY;  Surgeon: Daneil Dolin, MD;  [facility];  Service: Endoscopy;  Laterality: N/A;  . POLYPECTOMY  02/26/2019   Procedure: POLYPECTOMY;  Surgeon: Rogene Houston, MD;  [facility];  Service: Endoscopy;;  colon  . small intestine partial removal  06/2011   due to malignant tumor  . VASECTOMY      There were no vitals filed for this visit.  Subjective Assessment - 04/23/19 0819    Subjective  Patient reported pain which he rates as a 4/10. He said he walked for 2.5 miles and in the following  days had increased pain.    Pertinent History  Haglunds deformit B; Cancer remote, hx of eczuma on Rt heel    Patient Stated Goals  To be able to walk    Currently in Pain?  Yes    Pain Score  4     Pain Location  Ankle    Pain Orientation  Right    Pain Descriptors / Indicators  Aching    Pain Type  Chronic pain    Pain Onset  More than a month ago         [facility] PT Assessment - 04/23/19 0001      Palpation   Palpation comment  Hypomobility of left foot joints      Ambulation/Gait   Gait Comments  Barefoot ambulation demonstrating decreased great toe extension                   [facility] Adult PT Treatment/Exercise - 04/23/19 0001      Modalities    Modalities  Iontophoresis      Iontophoresis   Type of Iontophoresis  Dexamethasone    Location  heel/achilles tendon    Dose  5/6    Time  Take home patch, instructed to remove in 6 hours or if experiencing any adverse affects      Manual Therapy   Manual Therapy  Joint mobilization;Soft tissue mobilization;Passive ROM;Myofascial release    Manual therapy comments  done seperate from all other aspects of treatment    Joint Mobilization  Metatarsal glides 10-15 between each joint     Soft tissue mobilization  STM to Rt gastroc soleus complex in prone    Myofascial Release  Cross friction massage to right achilles tendon ankle in 20 degrees PF    Passive ROM  Great toe extension DF/PF      Ankle Exercises: Stretches   Plantar Fascia Stretch  3 reps;30 seconds   on 4'' step   Slant Board Stretch  3 reps;30 seconds      Ankle Exercises: Standing   Heel Raises  Both;Right;10 reps;3 seconds   2 sets   Heel Raises Limitations  up with both, down Rt only 3" holds    Balance Beam  Foam balance beam x 4 with intermittent UE assistance    Other Standing Ankle Exercises  Bil heel raise on 4" step with Rt eccentric lowering, 2x 15 reps             PT Education - 04/23/19 1606    Education Details  Discussed plan to focus on improving patient's foot joint mobility and great toe mobility to improve mechanics with ambulation.    Person(s) Educated  Patient    Methods  Explanation    Comprehension  Verbalized understanding       PT Short Term Goals - 04/18/19 1407      PT SHORT TERM GOAL #1   Title  PT to be I in HEP to decrease pain level in Rt heel to allow pt to be able to stand for five minues without shifting wt to be able to complete self grooming tasks in comfort.    Time  2    Period  Weeks    Status  Achieved    Target Date  04/09/19      PT SHORT TERM GOAL #2   Title  PT to be able to walk 1.5 miles without increased pain in RT heel/achilles.    Time  2  Period   Weeks    Status  Achieved      PT SHORT TERM GOAL #3   Title  PT to be able to single leg stance for 40 seconds on his RT LE to demonstrate improved stability to decrease pain with walking activity.    Time  2    Period  Weeks    Status  Achieved        PT Long Term Goals - 04/18/19 1409      PT LONG TERM GOAL #1   Title  PT to be able to stand with equal weight bearing for ten minutes or more to allow pt to be able to stand comfortably for socialization.    Time  4    Period  Weeks    Status  Achieved      PT LONG TERM GOAL #2   Title  PT to be able to walk for 2.5 miles without experiencing increased pain to return to normal walking with his dog.    Time  3    Period  Weeks    Status  On-going    Target Date  05/09/19      PT LONG TERM GOAL #3   Title  PT to be able to stand on his RT leg for 60 seconds for normal stabilty to allow pt to be able to walk on uneven ground in comfort.    Period  Weeks    Status  Achieved      PT LONG TERM GOAL #4   Title  Patient will report ability to perform sit to stand transfer in the morning with minimal to no difficulty.    Baseline  04/18/19: Patient reported moderate difficulty with this.    Time  3    Period  Weeks    Status  New    Target Date  05/09/19            Plan - 04/23/19 1610    Clinical Impression Statement  Focused on manual this session introducing cross friction massage to patient's achilles tendon as well as adding metatarsal glides, and PROM of great toe extension and ankle PF/DF. This session also added tandem ambulation on the foam beam with intermittent use of upper extremities for balance and noted compensation with ER of feet until cued to correct. Plan to continue progressing foot/ankle mobility exercises to improve overall mechanics with gait and decrease patient's pain. Patient would benefit from continued skilled physical therapy in order to continue progressing towards PLOF.    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 Add great toe extension stretches to patient's HEP next session.  Continue ionto for 1 treatment. F/U on effects of manual therapy last session and continue if beneficial adding additional joint mobilizaion and distraction techniques.    PT Home Exercise Plan  gastroc stretch, Single leg stance; eccentric lowering Rt heel raise    Consulted and Agree with Plan of Care  Patient  Patient will benefit from skilled therapeutic intervention in order to improve the following deficits and impairments:  Abnormal gait, Decreased activity tolerance, Decreased balance, Increased edema, Decreased strength, Decreased range of motion, Increased fascial restricitons, Pain  Visit Diagnosis: Pain in right ankle and joints of right foot  Other symptoms and signs involving the musculoskeletal system  Difficulty in walking, not elsewhere classified     Problem List Patient Active Problem List   Diagnosis Date Noted  . Hx of colonic polyps 08/07/2018  . Barrett's esophagus without dysplasia 05/10/2016  . Incisional hernia 02/08/2013  . GIST (gastrointestinal stromal tumor) of small bowel, malignant ([place]) 07/04/2011  . Status post small bowel resection 06/22/2011  . Abdominal mass 06/19/2011  . Essential hypertension 06/19/2011  . GI bleeding 06/17/2011  . Acute blood loss  anemia 06/17/2011  . Syncope 06/17/2011  . Iron deficiency anemia 04/19/2011  . Schatzki's ring 01/03/2011  . Colon adenoma 01/03/2011  . Family hx of colon cancer 01/03/2011  . HELICOBACTER PYLORI GASTRITIS 12/22/2009  . BARRETTS ESOPHAGUS 12/22/2009   Clarene Critchley PT, DPT 4:14 PM, 04/23/19 [place] [address] [place], [place], [address] Phone: [phone]   Fax:  [phone]  Name: David Weiss MRN: [id number] Date of Birth: 1946-03-10

## 2019-04-25 ENCOUNTER — Encounter (HOSPITAL_COMMUNITY): Payer: Self-pay | Admitting: Physical Therapy

## 2019-04-25 ENCOUNTER — Ambulatory Visit (HOSPITAL_COMMUNITY): Payer: Medicare Other | Admitting: Physical Therapy

## 2019-04-25 ENCOUNTER — Other Ambulatory Visit: Payer: Self-pay

## 2019-04-25 DIAGNOSIS — M25571 Pain in right ankle and joints of right foot: Secondary | ICD-10-CM

## 2019-04-25 DIAGNOSIS — R262 Difficulty in walking, not elsewhere classified: Secondary | ICD-10-CM

## 2019-04-25 DIAGNOSIS — R29898 Other symptoms and signs involving the musculoskeletal system: Secondary | ICD-10-CM

## 2019-04-25 NOTE — Therapy (Signed)
[place], [place], [address] Phone: [phone]   Fax:  [phone]  Physical Therapy Treatment  Patient Details  Name: David Weiss MRN: [id number] Date of Birth: August 05, 1945 Referring Provider (PT): Landis Martins   Encounter Date: 04/25/2019  PT End of Session - 04/25/19 [id number]    Visit Number  10    Number of Visits  14    Date for PT Re-Evaluation  05/09/19    Authorization Type  medicare    Authorization - Visit Number  2    Authorization - Number of Visits  6    PT Start Time  0817    PT Stop Time  0857    PT Time Calculation (min)  40 min    Activity Tolerance  Patient tolerated treatment well    Behavior During Therapy  [facility] for tasks assessed/performed       Past Medical History:  Diagnosis Date  . Anemia   . Barrett's esophagus 5/11   EGD Dr. Phillips Climes Barrett's, multiple antral/bulbar erosins, D2 erosion sec NSAIDs  . Diverticulosis 5/11   Colonoscopy Dr Gala Romney  . Family hx of colon cancer   . GERD (gastroesophageal reflux disease)   . Helicobacter pylori gastritis 5/11  . HTN (hypertension)   . Hx of adenomatous colonic polyps 8/10   Colonoscopy Dr. Gala Romney  . Hyperlipemia   . Schatzki's ring 5/11   17F dilator    Past Surgical History:  Procedure Laterality Date  . BIOPSY N/A 08/30/2016   Procedure: BIOPSY;  Surgeon: Rogene Houston, MD;  [facility];  Service: Endoscopy;  Laterality: N/A;  esophageal  . CAPSULE ENDOSCOPY OF THE SMALL BOWEL  02/17/2011      . COLONOSCOPY  05/19/2011   Procedure: COLONOSCOPY;  Surgeon: Daneil Dolin, MD;  [facility];  Service: Endoscopy;  Laterality: N/A;  8:30  . COLONOSCOPY N/A 08/21/2013   Procedure: COLONOSCOPY;  Surgeon: Rogene Houston, MD;  [facility];  Service: Endoscopy;  Laterality: N/A;  930  . COLONOSCOPY N/A 02/26/2019   Procedure: COLONOSCOPY;  Surgeon: Rogene Houston, MD;  [facility];  Service: Endoscopy;  Laterality: N/A;  9:30-rescheduled to 5/20 @ 11:15 per Lelon Frohlich  . EAR CYST EXCISION N/A 03/25/2014   Procedure: EXCISION OF NEOPLASM NECK;  Surgeon: Jamesetta So, MD;  [facility]: [facility];  Service: General;  Laterality: N/A;  . ESOPHAGOGASTRODUODENOSCOPY  02/13/2011   Procedure: ESOPHAGOGASTRODUODENOSCOPY (EGD);  Surgeon: Daneil Dolin, MD;  [facility];  Service: Endoscopy;  Laterality: N/A;  . ESOPHAGOGASTRODUODENOSCOPY N/A 08/30/2016   Procedure: ESOPHAGOGASTRODUODENOSCOPY (EGD);  Surgeon: Rogene Houston, MD;  [facility];  Service: Endoscopy;  Laterality: N/A;  730  . excisional hernia  01/2013  . GIVENS CAPSULE STUDY  02/15/2011   Procedure: GIVENS CAPSULE STUDY;  Surgeon: Daneil Dolin, MD;  [facility];  Service: Endoscopy;  Laterality: N/A;  . POLYPECTOMY  02/26/2019   Procedure: POLYPECTOMY;  Surgeon: Rogene Houston, MD;  [facility];  Service: Endoscopy;;  colon  . small intestine partial removal  06/2011   due to malignant tumor  . VASECTOMY      There were no vitals filed for this visit.  Subjective Assessment - 04/25/19 0820    Subjective  Patient reported that he is having 3/10.    Pertinent History  Haglunds deformit B; Cancer remote, hx of  eczuma on Rt heel    Patient Stated Goals  To be able to walk    Currently in Pain?  Yes    Pain Score  3     Pain Location  Ankle    Pain Orientation  Right    Pain Descriptors / Indicators  Aching    Pain Type  Chronic pain    Pain Onset  More than a month ago                       [facility] Adult PT Treatment/Exercise - 04/25/19 0001      Modalities   Modalities  Iontophoresis      Iontophoresis   Type of Iontophoresis  Dexamethasone    Location  heel/achilles tendon    Dose  6/6    Time  Take home patch, instructed to remove in 6 hours or if experiencing any adverse affects      Manual Therapy   Manual Therapy  Joint mobilization;Soft  tissue mobilization;Passive ROM;Myofascial release    Manual therapy comments  done seperate from all other aspects of treatment    Joint Mobilization  TC joint distraction grade IV 4x20-30 seconds    Soft tissue mobilization  STM to Rt gastroc soleus complex in prone    Myofascial Release  Cross friction massage to right achilles tendon ankle in 20 degrees PF      Ankle Exercises: Stretches   Plantar Fascia Stretch  3 reps;30 seconds   on 4'' step   Slant Board Stretch  3 reps;30 seconds      Ankle Exercises: Standing   Heel Raises  Both;Right;10 reps;3 seconds   2 sets   Heel Raises Limitations  up with both, down Rt only 3" holds    Balance Beam  Foam balance beam x 4 with intermittent UE assistance    Other Standing Ankle Exercises  Bil heel raise on 4" step with Rt eccentric lowering, 2x 15 reps               PT Short Term Goals - 04/18/19 1407      PT SHORT TERM GOAL #1   Title  PT to be I in HEP to decrease pain level in Rt heel to allow pt to be able to stand for five minues without shifting wt to be able to complete self grooming tasks in comfort.    Time  2    Period  Weeks    Status  Achieved    Target Date  04/09/19      PT SHORT TERM GOAL #2   Title  PT to be able to walk 1.5 miles without increased pain in RT heel/achilles.    Time  2    Period  Weeks    Status  Achieved      PT SHORT TERM GOAL #3   Title  PT to be able to single leg stance for 40 seconds on his RT LE to demonstrate improved stability to decrease pain with walking activity.    Time  2    Period  Weeks    Status  Achieved        PT Long Term Goals - 04/18/19 1409      PT LONG TERM GOAL #1   Title  PT to be able to stand with equal weight bearing for ten minutes or more to allow pt to be able to stand comfortably for socialization.    Time  4    Period  Weeks    Status  Achieved      PT LONG TERM GOAL #2   Title  PT to be able to walk for 2.5 miles without experiencing  increased pain to return to normal walking with his dog.    Time  3    Period  Weeks    Status  On-going    Target Date  05/09/19      PT LONG TERM GOAL #3   Title  PT to be able to stand on his RT leg for 60 seconds for normal stabilty to allow pt to be able to walk on uneven ground in comfort.    Period  Weeks    Status  Achieved      PT LONG TERM GOAL #4   Title  Patient will report ability to perform sit to stand transfer in the morning with minimal to no difficulty.    Baseline  04/18/19: Patient reported moderate difficulty with this.    Time  3    Period  Weeks    Status  New    Target Date  05/09/19            Plan - 04/25/19 [id number]    Clinical Impression Statement  Continued with focus on ankle and foot strengthening with manual therapy to improve mobility and decrease pain. Ended session with talocrural joint mobilization with patient reported reduced his pain symptoms to a 0/10. Completed last take home patch of ionto this session. Patient would benefit from continued skilled physical therapy in order to continue progressing towards functional goals.    Personal Factors and Comorbidities  Time since onset of injury/illness/exacerbation    Examination-Activity Limitations  Stand;Squat;Stairs;Locomotion Level    Examination-Participation Restrictions  Community Activity;Other;Yard Work    [employer]  Evolving/Moderate complexity    Rehab Potential  Good    PT Frequency  2x / week    PT Duration  3 weeks    PT Treatment/Interventions  ADLs/Self Care Home Management;Patient/family education;Manual techniques;Iontophoresis 4mg /ml Dexamethasone;Passive range of motion;Balance training;Therapeutic exercise    PT Next Visit Plan  Continue eccentric work focusing more on endurance. F/U on effects of manual therapy last session and continue if beneficial adding additional joint mobilizaion.    PT Home Exercise Plan  gastroc stretch, Single leg stance;  eccentric lowering Rt heel raise; 04/25/19: Great toe lifts 15x daily    Consulted and Agree with Plan of Care  Patient       Patient will benefit from skilled therapeutic intervention in order to improve the following deficits and impairments:  Abnormal gait, Decreased activity tolerance, Decreased balance, Increased edema, Decreased strength, Decreased range of motion, Increased fascial restricitons, Pain  Visit Diagnosis: Pain in right ankle and joints of right foot  Other symptoms and signs involving the musculoskeletal system  Difficulty in walking, not elsewhere classified     Problem List Patient Active Problem List   Diagnosis Date Noted  . Hx of colonic polyps 08/07/2018  . Barrett's esophagus without dysplasia 05/10/2016  . Incisional hernia 02/08/2013  . GIST (gastrointestinal stromal tumor) of small bowel, malignant ([place]) 07/04/2011  . Status post small bowel resection 06/22/2011  . Abdominal mass 06/19/2011  . Essential hypertension 06/19/2011  . GI bleeding 06/17/2011  . Acute blood loss anemia 06/17/2011  . Syncope 06/17/2011  . Iron deficiency anemia 04/19/2011  . Schatzki's ring 01/03/2011  . Colon adenoma 01/03/2011  . Family hx of colon  cancer 01/03/2011  . HELICOBACTER PYLORI GASTRITIS 12/22/2009  . BARRETTS ESOPHAGUS 12/22/2009   Clarene Critchley PT, DPT 9:30 AM, 04/25/19 [place] [address] [place], [place], [address] Phone: [phone]   Fax:  [phone]  Name: David Weiss MRN: [id number] Date of Birth: October 12, 1945

## 2019-04-30 ENCOUNTER — Other Ambulatory Visit: Payer: Self-pay

## 2019-04-30 ENCOUNTER — Encounter (HOSPITAL_COMMUNITY): Payer: Self-pay

## 2019-04-30 ENCOUNTER — Ambulatory Visit (HOSPITAL_COMMUNITY): Payer: Medicare Other

## 2019-04-30 DIAGNOSIS — R29898 Other symptoms and signs involving the musculoskeletal system: Secondary | ICD-10-CM

## 2019-04-30 DIAGNOSIS — M25571 Pain in right ankle and joints of right foot: Secondary | ICD-10-CM | POA: Diagnosis not present

## 2019-04-30 DIAGNOSIS — R262 Difficulty in walking, not elsewhere classified: Secondary | ICD-10-CM | POA: Diagnosis not present

## 2019-04-30 NOTE — Therapy (Signed)
[place], [place], [address] Phone: [phone]   Fax:  [phone]  Physical Therapy Treatment  Patient Details  Name: David Weiss MRN: [id number] Date of Birth: 03/03/1946 Referring Provider (PT): Landis Martins   Encounter Date: 04/30/2019  PT End of Session - 04/30/19 0859    Visit Number  11    Number of Visits  14    Date for PT Re-Evaluation  05/09/19    Authorization Type  medicare    Authorization - Visit Number  3    Authorization - Number of Visits  6    PT Start Time  0900    PT Stop Time  0940    PT Time Calculation (min)  40 min    Activity Tolerance  Patient tolerated treatment well    Behavior During Therapy  [facility] for tasks assessed/performed       Past Medical History:  Diagnosis Date  . Anemia   . Barrett's esophagus 5/11   EGD Dr. Phillips Climes Barrett's, multiple antral/bulbar erosins, D2 erosion sec NSAIDs  . Diverticulosis 5/11   Colonoscopy Dr Gala Romney  . Family hx of colon cancer   . GERD (gastroesophageal reflux disease)   . Helicobacter pylori gastritis 5/11  . HTN (hypertension)   . Hx of adenomatous colonic polyps 8/10   Colonoscopy Dr. Gala Romney  . Hyperlipemia   . Schatzki's ring 5/11   88F dilator    Past Surgical History:  Procedure Laterality Date  . BIOPSY N/A 08/30/2016   Procedure: BIOPSY;  Surgeon: Rogene Houston, MD;  [facility];  Service: Endoscopy;  Laterality: N/A;  esophageal  . CAPSULE ENDOSCOPY OF THE SMALL BOWEL  02/17/2011      . COLONOSCOPY  05/19/2011   Procedure: COLONOSCOPY;  Surgeon: Daneil Dolin, MD;  [facility];  Service: Endoscopy;  Laterality: N/A;  8:30  . COLONOSCOPY N/A 08/21/2013   Procedure: COLONOSCOPY;  Surgeon: Rogene Houston, MD;  [facility];  Service: Endoscopy;  Laterality: N/A;  930  . COLONOSCOPY N/A 02/26/2019   Procedure: COLONOSCOPY;  Surgeon: Rogene Houston, MD;  [facility];  Service: Endoscopy;  Laterality: N/A;  9:30-rescheduled to 5/20 @ 11:15 per Lelon Frohlich  . EAR CYST EXCISION N/A 03/25/2014   Procedure: EXCISION OF NEOPLASM NECK;  Surgeon: Jamesetta So, MD;  [facility]: [facility];  Service: General;  Laterality: N/A;  . ESOPHAGOGASTRODUODENOSCOPY  02/13/2011   Procedure: ESOPHAGOGASTRODUODENOSCOPY (EGD);  Surgeon: Daneil Dolin, MD;  [facility];  Service: Endoscopy;  Laterality: N/A;  . ESOPHAGOGASTRODUODENOSCOPY N/A 08/30/2016   Procedure: ESOPHAGOGASTRODUODENOSCOPY (EGD);  Surgeon: Rogene Houston, MD;  [facility];  Service: Endoscopy;  Laterality: N/A;  730  . excisional hernia  01/2013  . GIVENS CAPSULE STUDY  02/15/2011   Procedure: GIVENS CAPSULE STUDY;  Surgeon: Daneil Dolin, MD;  [facility];  Service: Endoscopy;  Laterality: N/A;  . POLYPECTOMY  02/26/2019   Procedure: POLYPECTOMY;  Surgeon: Rogene Houston, MD;  [facility];  Service: Endoscopy;;  colon  . small intestine partial removal  06/2011   due to malignant tumor  . VASECTOMY      There were no vitals filed for this visit.  Subjective Assessment - 04/30/19 0859    Subjective  Pt reports being a little sore after manual therapy last time. Pt reports his R achilles still hurts in the  morning and late in the afternoon.    Pertinent History  Haglunds deformit B; Cancer remote, hx of eczuma on Rt heel    Patient Stated Goals  To be able to walk    Currently in Pain?  No/denies    Pain Onset  More than a month ago                       [facility] Adult PT Treatment/Exercise - 04/30/19 0001      Manual Therapy   Manual Therapy  Joint mobilization;Soft tissue mobilization    Manual therapy comments  done seperate from all other aspects of treatment    Joint Mobilization  TC joint distraction, grade I_II      Ankle Exercises: Stretches   Plantar Fascia Stretch  3 reps;30 seconds   on 4" step   Slant Board Stretch  3 reps;30  seconds      Ankle Exercises: Machines for Strengthening   Cybex Leg Press  40# BLE calf raises, 2x15 reps; RLE calf raises, 30#, x15 reps      Ankle Exercises: Standing   Heel Raises Limitations  Bil heel raise on 4" step, R eccentric lowering, 2x15 reps    Other Standing Ankle Exercises  soleus calf raise, 2x15 reps    Other Standing Ankle Exercises  forward lunges, x10 reps BLE             PT Education - 04/30/19 0859    Education Details  Exercicse technique, continue HEP    Person(s) Educated  Patient    Methods  Explanation    Comprehension  Verbalized understanding       PT Short Term Goals - 04/18/19 1407      PT SHORT TERM GOAL #1   Title  PT to be I in HEP to decrease pain level in Rt heel to allow pt to be able to stand for five minues without shifting wt to be able to complete self grooming tasks in comfort.    Time  2    Period  Weeks    Status  Achieved    Target Date  04/09/19      PT SHORT TERM GOAL #2   Title  PT to be able to walk 1.5 miles without increased pain in RT heel/achilles.    Time  2    Period  Weeks    Status  Achieved      PT SHORT TERM GOAL #3   Title  PT to be able to single leg stance for 40 seconds on his RT LE to demonstrate improved stability to decrease pain with walking activity.    Time  2    Period  Weeks    Status  Achieved        PT Long Term Goals - 04/18/19 1409      PT LONG TERM GOAL #1   Title  PT to be able to stand with equal weight bearing for ten minutes or more to allow pt to be able to stand comfortably for socialization.    Time  4    Period  Weeks    Status  Achieved      PT LONG TERM GOAL #2   Title  PT to be able to walk for 2.5 miles without experiencing increased pain to return to normal walking with his dog.    Time  3    Period  Weeks    Status  On-going  Target Date  05/09/19      PT LONG TERM GOAL #3   Title  PT to be able to stand on his RT leg for 60 seconds for normal stabilty to  allow pt to be able to walk on uneven ground in comfort.    Period  Weeks    Status  Achieved      PT LONG TERM GOAL #4   Title  Patient will report ability to perform sit to stand transfer in the morning with minimal to no difficulty.    Baseline  04/18/19: Patient reported moderate difficulty with this.    Time  3    Period  Weeks    Status  New    Target Date  05/09/19            Plan - 04/30/19 0900    Clinical Impression Statement  Pt continues to complain of early morning and late afternoon pain in R Achilles. Added weight training on legpress machine this date. Pt with increased back pain trying to compensate for R calf weakness despite verbal cues. Continued with standing calf raises with eccentric lowering. Added soleus calf raises this date without pain complaints, demonstrating muscle weakness per shaking with exercise. Ended with manual STM and general TC joint distraction to reduce pain and improve muscle extensibility. Continue to progress as able.    Personal Factors and Comorbidities  Time since onset of injury/illness/exacerbation    Examination-Activity Limitations  Stand;Squat;Stairs;Locomotion Level    Examination-Participation Restrictions  Community Activity;Other;Yard Work    [employer] officer  Evolving/Moderate complexity    Rehab Potential  Good    PT Frequency  2x / week    PT Duration  3 weeks    PT Treatment/Interventions  ADLs/Self Care Home Management;Patient/family education;Manual techniques;Iontophoresis 4mg /ml Dexamethasone;Passive range of motion;Balance training;Therapeutic exercise    PT Next Visit Plan  Continue eccentric work, adding weight. Continue manual therapy PRN.    PT Home Exercise Plan  gastroc stretch, Single leg stance; eccentric lowering Rt heel raise; 04/25/19: Great toe lifts 15x daily    Consulted and Agree with Plan of Care  Patient       Patient will benefit from skilled therapeutic intervention in order to  improve the following deficits and impairments:  Abnormal gait, Decreased activity tolerance, Decreased balance, Increased edema, Decreased strength, Decreased range of motion, Increased fascial restricitons, Pain  Visit Diagnosis: Pain in right ankle and joints of right foot  Other symptoms and signs involving the musculoskeletal system  Difficulty in walking, not elsewhere classified     Problem List Patient Active Problem List   Diagnosis Date Noted  . Hx of colonic polyps 08/07/2018  . Barrett's esophagus without dysplasia 05/10/2016  . Incisional hernia 02/08/2013  . GIST (gastrointestinal stromal tumor) of small bowel, malignant ([place]) 07/04/2011  . Status post small bowel resection 06/22/2011  . Abdominal mass 06/19/2011  . Essential hypertension 06/19/2011  . GI bleeding 06/17/2011  . Acute blood loss anemia 06/17/2011  . Syncope 06/17/2011  . Iron deficiency anemia 04/19/2011  . Schatzki's ring 01/03/2011  . Colon adenoma 01/03/2011  . Family hx of colon cancer 01/03/2011  . HELICOBACTER PYLORI GASTRITIS 12/22/2009  . BARRETTS ESOPHAGUS 12/22/2009    Talbot Grumbling PT, DPT 04/30/19, 9:47 AM [facility] [address] [place], [place], [address] Phone: [phone]   Fax:  [phone]  Name: David Weiss MRN: [id number] Date of Birth: 1946/03/06

## 2019-05-02 ENCOUNTER — Other Ambulatory Visit: Payer: Self-pay

## 2019-05-02 ENCOUNTER — Ambulatory Visit (HOSPITAL_COMMUNITY): Payer: Medicare Other | Attending: Sports Medicine

## 2019-05-02 ENCOUNTER — Encounter (HOSPITAL_COMMUNITY): Payer: Self-pay

## 2019-05-02 DIAGNOSIS — M25571 Pain in right ankle and joints of right foot: Secondary | ICD-10-CM | POA: Insufficient documentation

## 2019-05-02 DIAGNOSIS — R262 Difficulty in walking, not elsewhere classified: Secondary | ICD-10-CM | POA: Insufficient documentation

## 2019-05-02 DIAGNOSIS — R29898 Other symptoms and signs involving the musculoskeletal system: Secondary | ICD-10-CM

## 2019-05-02 NOTE — Therapy (Signed)
[facility] [address] [place], [place], [address] Phone: [phone]   Fax:  [phone]   PHYSICAL THERAPY DISCHARGE SUMMARY  Visits from Start of Care: 12  Current functional level related to goals / functional outcomes: See below   Remaining deficits: See below   Education / Equipment: HEP  Plan: Patient agrees to discharge.  Patient goals were met. Patient is being discharged due to being pleased with the current functional level.  ?????       Physical Therapy Treatment  Patient Details  Name: David Weiss MRN: [id number] Date of Birth: [birth year] Referring Provider (PT): Landis Martins   Encounter Date: 05/02/2019  PT End of Session - 05/02/19 0950    Visit Number  12    Number of Visits  14    Date for PT Re-Evaluation  05/09/19    Authorization Type  medicare    Authorization - Visit Number  4    Authorization - Number of Visits  6    PT Start Time  0945    PT Stop Time  0958    PT Time Calculation (min)  13 min    Activity Tolerance  Patient tolerated treatment well    Behavior During Therapy  [facility] for tasks assessed/performed       Past Medical History:  Diagnosis Date  . Anemia   . Barrett's esophagus 5/11   EGD Dr. Phillips Climes Barrett's, multiple antral/bulbar erosins, D2 erosion sec NSAIDs  . Diverticulosis 5/11   Colonoscopy Dr Gala Romney  . Family hx of colon cancer   . GERD (gastroesophageal reflux disease)   . Helicobacter pylori gastritis 5/11  . HTN (hypertension)   . Hx of adenomatous colonic polyps 8/10   Colonoscopy Dr. Gala Romney  . Hyperlipemia   . Schatzki's ring 5/11   91F dilator    Past Surgical History:  Procedure Laterality Date  . BIOPSY N/A 08/30/2016   Procedure: BIOPSY;  Surgeon: Rogene Houston, MD;  [facility];  Service: Endoscopy;  Laterality: N/A;  esophageal  . CAPSULE ENDOSCOPY OF THE SMALL BOWEL  02/17/2011      . COLONOSCOPY  05/19/2011   Procedure:  COLONOSCOPY;  Surgeon: Daneil Dolin, MD;  [facility];  Service: Endoscopy;  Laterality: N/A;  8:30  . COLONOSCOPY N/A 08/21/2013   Procedure: COLONOSCOPY;  Surgeon: Rogene Houston, MD;  [facility];  Service: Endoscopy;  Laterality: N/A;  930  . COLONOSCOPY N/A 02/26/2019   Procedure: COLONOSCOPY;  Surgeon: Rogene Houston, MD;  [facility];  Service: Endoscopy;  Laterality: N/A;  9:30-rescheduled to 5/20 @ 11:15 per Lelon Frohlich  . EAR CYST EXCISION N/A 03/25/2014   Procedure: EXCISION OF NEOPLASM NECK;  Surgeon: Jamesetta So, MD;  Location: [facility];  Service: General;  Laterality: N/A;  . ESOPHAGOGASTRODUODENOSCOPY  02/13/2011   Procedure: ESOPHAGOGASTRODUODENOSCOPY (EGD);  Surgeon: Daneil Dolin, MD;  [facility];  Service: Endoscopy;  Laterality: N/A;  . ESOPHAGOGASTRODUODENOSCOPY N/A 08/30/2016   Procedure: ESOPHAGOGASTRODUODENOSCOPY (EGD);  Surgeon: Rogene Houston, MD;  [facility];  Service: Endoscopy;  Laterality: N/A;  730  . excisional hernia  01/2013  . GIVENS CAPSULE STUDY  02/15/2011   Procedure: GIVENS CAPSULE STUDY;  Surgeon: Daneil Dolin, MD;  [facility];  Service: Endoscopy;  Laterality: N/A;  . POLYPECTOMY  02/26/2019   Procedure: POLYPECTOMY;  Surgeon: Rogene Houston, MD;  [facility];  Service: Endoscopy;;  colon  . small intestine partial removal  06/2011   due to malignant tumor  . VASECTOMY      There were no vitals filed for this visit.  Subjective Assessment - 05/02/19 0949    Subjective  Pt reports feeling good, pain-free for past 2 days. Pt reports playing golf without difficulty yesterday. Pt reports significant improvement and requests to d/c today.    Pertinent History  Haglunds deformit B; Cancer remote, hx of eczuma on Rt heel    Patient Stated Goals  To be able to walk    Currently in Pain?  No/denies    Pain Onset  More than a month ago         [facility] PT Assessment -  05/02/19 0001      Assessment   Medical Diagnosis  Rt achilles tendonitis    Referring Provider (PT)  Landis Martins      Prior Function   Level of Independence  Independent      Cognition   Overall Cognitive Status  Within Functional Limits for tasks assessed      Observation/Other Assessments   Focus on Therapeutic Outcomes (FOTO)   27% limited   was 37% limited              PT Education - 05/02/19 0949    Education Details  d/c to HEP    Person(s) Educated  Patient    Methods  Explanation    Comprehension  Verbalized understanding       PT Short Term Goals - 05/02/19 0954      PT SHORT TERM GOAL #1   Title  PT to be I in HEP to decrease pain level in Rt heel to allow pt to be able to stand for five minues without shifting wt to be able to complete self grooming tasks in comfort.    Time  2    Period  Weeks    Status  Achieved    Target Date  04/09/19      PT SHORT TERM GOAL #2   Title  PT to be able to walk 1.5 miles without increased pain in RT heel/achilles.    Time  2    Period  Weeks    Status  Achieved      PT SHORT TERM GOAL #3   Title  PT to be able to single leg stance for 40 seconds on his RT LE to demonstrate improved stability to decrease pain with walking activity.    Time  2    Period  Weeks    Status  Achieved        PT Long Term Goals - 05/02/19 0954      PT LONG TERM GOAL #1   Title  PT to be able to stand with equal weight bearing for ten minutes or more to allow pt to be able to stand comfortably for socialization.    Time  4    Period  Weeks    Status  Achieved      PT LONG TERM GOAL #2   Title  PT to be able to walk for 2.5 miles without experiencing increased pain to return to normal walking with his dog.    Baseline  10/2: pt reports able to complete, soreness experienced after    Time  3    Period  Weeks    Status  Achieved      PT LONG TERM  GOAL #3   Title  PT to be able to stand on his RT leg for 60 seconds for  normal stabilty to allow pt to be able to walk on uneven ground in comfort.    Period  Weeks    Status  Achieved      PT LONG TERM GOAL #4   Title  Patient will report ability to perform sit to stand transfer in the morning with minimal to no difficulty.    Baseline  04/18/19: Patient reported moderate difficulty with this; 10/2: pt reports no difficulty    Time  3    Period  Weeks    Status  Achieved            Plan - 05/02/19 0950    Clinical Impression Statement  Pt motivated to d/c this date due to pain-free with activity and independent with HEP. Pt has met all STG and LTGs at reassessment this date. Pt self reports no pain with golfing, walking for exercise, and no pain or difficulty around the house with activities. Pt reports continues to experience some soreness, but wearing tennis shoes has made a significant improvement in his pain, as well as the dexamethasone. Verbally reviewed HEP, educated pt on activity within tolerance and increasing as able and appropriate tennis shoe wear with activities. Pt educated to get new referral if needs arise in the future and verbalized understanding.    Personal Factors and Comorbidities  Time since onset of injury/illness/exacerbation    Examination-Activity Limitations  Stand;Squat;Stairs;Locomotion Level    Examination-Participation Restrictions  Community Activity;Other;Yard Work    [employer]chant [employer] officer  Evolving/Moderate complexity    Rehab Potential  Good    PT Frequency  2x / week    PT Duration  3 weeks    PT Treatment/Interventions  ADLs/Self Care Home Management;Patient/family education;Manual techniques;Iontophoresis 51m/ml Dexamethasone;Passive range of motion;Balance training;Therapeutic exercise    PT Next Visit Plan  d/c to HEP    PT Home Exercise Plan  gastroc stretch, Single leg stance; eccentric lowering Rt heel raise; 04/25/19: Great toe lifts 15x daily    Consulted and Agree with Plan of Care  Patient        Patient will benefit from skilled therapeutic intervention in order to improve the following deficits and impairments:  Abnormal gait, Decreased activity tolerance, Decreased balance, Increased edema, Decreased strength, Decreased range of motion, Increased fascial restricitons, Pain  Visit Diagnosis: Pain in right ankle and joints of right foot  Other symptoms and signs involving the musculoskeletal system  Difficulty in walking, not elsewhere classified     Problem List Patient Active Problem List   Diagnosis Date Noted  . Hx of colonic polyps 08/07/2018  . Barrett's esophagus without dysplasia 05/10/2016  . Incisional hernia 02/08/2013  . 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 Status post small bowel resection 06/22/2011  . Abdominal mass 06/19/2011  . Essential hypertension 06/19/2011  . GI bleeding 06/17/2011  . Acute blood loss anemia 06/17/2011  . Syncope 06/17/2011  . Iron deficiency anemia 04/19/2011  . Schatzki's ring 01/03/2011  . Colon adenoma 01/03/2011  . Family hx of colon cancer 01/03/2011  . HELICOBACTER PYLORI GASTRITIS 12/22/2009  . BARRETTS ESOPHAGUS 12/22/2009     TTalbot GrumblingPT, DPT 05/02/19, 10:07 AM [address] [address]Phone: [phone]  Fax:  [phone] Name: MMOSIAH BASTINMRN: [id number] of  Birth: [birth year]

## 2019-05-06 ENCOUNTER — Encounter (HOSPITAL_COMMUNITY): Payer: Self-pay

## 2019-05-07 ENCOUNTER — Encounter (HOSPITAL_COMMUNITY): Payer: Self-pay

## 2019-05-07 DIAGNOSIS — Z125 Encounter for screening for malignant neoplasm of prostate: Secondary | ICD-10-CM | POA: Diagnosis not present

## 2019-05-07 DIAGNOSIS — I10 Essential (primary) hypertension: Secondary | ICD-10-CM | POA: Diagnosis not present

## 2019-05-07 DIAGNOSIS — Z79899 Other long term (current) drug therapy: Secondary | ICD-10-CM | POA: Diagnosis not present

## 2019-05-07 DIAGNOSIS — G4700 Insomnia, unspecified: Secondary | ICD-10-CM | POA: Diagnosis not present

## 2019-05-09 ENCOUNTER — Encounter (HOSPITAL_COMMUNITY): Payer: Self-pay

## 2019-05-14 DIAGNOSIS — Z23 Encounter for immunization: Secondary | ICD-10-CM | POA: Diagnosis not present

## 2019-05-14 DIAGNOSIS — I10 Essential (primary) hypertension: Secondary | ICD-10-CM | POA: Diagnosis not present

## 2019-05-14 DIAGNOSIS — E785 Hyperlipidemia, unspecified: Secondary | ICD-10-CM | POA: Diagnosis not present

## 2019-05-27 ENCOUNTER — Other Ambulatory Visit: Payer: Self-pay

## 2019-05-27 DIAGNOSIS — Z20822 Contact with and (suspected) exposure to covid-19: Secondary | ICD-10-CM

## 2019-05-28 LAB — NOVEL CORONAVIRUS, NAA: SARS-CoV-2, NAA: NOT DETECTED

## 2019-06-17 ENCOUNTER — Other Ambulatory Visit: Payer: Self-pay | Admitting: *Deleted

## 2019-06-17 DIAGNOSIS — Z20822 Contact with and (suspected) exposure to covid-19: Secondary | ICD-10-CM

## 2019-06-17 DIAGNOSIS — Z20828 Contact with and (suspected) exposure to other viral communicable diseases: Secondary | ICD-10-CM | POA: Diagnosis not present

## 2019-06-23 LAB — NOVEL CORONAVIRUS, NAA: SARS-CoV-2, NAA: NOT DETECTED

## 2019-08-27 DIAGNOSIS — M84374D Stress fracture, right foot, subsequent encounter for fracture with routine healing: Secondary | ICD-10-CM | POA: Diagnosis not present

## 2019-08-27 DIAGNOSIS — M9261 Juvenile osteochondrosis of tarsus, right ankle: Secondary | ICD-10-CM | POA: Diagnosis not present

## 2019-08-27 DIAGNOSIS — M79671 Pain in right foot: Secondary | ICD-10-CM | POA: Diagnosis not present

## 2019-08-27 DIAGNOSIS — M9262 Juvenile osteochondrosis of tarsus, left ankle: Secondary | ICD-10-CM | POA: Diagnosis not present

## 2019-09-02 DIAGNOSIS — Z23 Encounter for immunization: Secondary | ICD-10-CM | POA: Diagnosis not present

## 2019-09-23 DIAGNOSIS — Z23 Encounter for immunization: Secondary | ICD-10-CM | POA: Diagnosis not present

## 2019-10-22 DIAGNOSIS — M9261 Juvenile osteochondrosis of tarsus, right ankle: Secondary | ICD-10-CM | POA: Diagnosis not present

## 2019-10-22 DIAGNOSIS — M9262 Juvenile osteochondrosis of tarsus, left ankle: Secondary | ICD-10-CM | POA: Diagnosis not present

## 2019-11-12 DIAGNOSIS — I10 Essential (primary) hypertension: Secondary | ICD-10-CM | POA: Diagnosis not present

## 2019-11-12 DIAGNOSIS — G4700 Insomnia, unspecified: Secondary | ICD-10-CM | POA: Diagnosis not present

## 2019-11-18 DIAGNOSIS — H527 Unspecified disorder of refraction: Secondary | ICD-10-CM | POA: Diagnosis not present

## 2019-11-18 DIAGNOSIS — Z9889 Other specified postprocedural states: Secondary | ICD-10-CM | POA: Diagnosis not present

## 2019-11-18 DIAGNOSIS — H25813 Combined forms of age-related cataract, bilateral: Secondary | ICD-10-CM | POA: Diagnosis not present

## 2020-01-10 ENCOUNTER — Telehealth: Payer: Self-pay

## 2020-01-10 ENCOUNTER — Ambulatory Visit
Admission: EM | Admit: 2020-01-10 | Discharge: 2020-01-10 | Disposition: A | Discharge status: Home / Self Care | Payer: Medicare Other | Attending: Emergency Medicine | Admitting: Emergency Medicine

## 2020-01-10 ENCOUNTER — Other Ambulatory Visit: Payer: Self-pay

## 2020-01-10 DIAGNOSIS — S01512A Laceration without foreign body of oral cavity, initial encounter: Secondary | ICD-10-CM

## 2020-01-10 MED ORDER — CLINDAMYCIN HCL 300 MG PO CAPS: 300.0000 mg | ORAL_CAPSULE | Freq: Three times a day (TID) | ORAL | 0 refills | Status: DC

## 2020-01-10 MED ORDER — CLINDAMYCIN HCL 300 MG PO CAPS: 300.0000 mg | ORAL_CAPSULE | Freq: Three times a day (TID) | ORAL | 0 refills | Status: AC

## 2020-01-10 NOTE — ED Triage Notes (Signed)
Pt presents with laceration to tongue that happened yesterday

## 2020-01-10 NOTE — Discharge Instructions (Signed)
Rinse mouth after meals with mouthwash, or salt water Prescription for antibiotic given.  Fill if you experience swelling, redness, pain, etc... Use OTC tylenol for pain relief Follow up with PCP as needed Return or go to the ED if you have any new or worsening symptoms such as increased pain, redness, swelling, drainage, discharge, etc..

## 2020-01-10 NOTE — ED Provider Notes (Signed)
[facility]   [id number] 01/10/20 Arrival Time: 0932  CC: LACERATION  SUBJECTIVE:  David Weiss is a 74 y.o. male who presents with a laceration that occurred last night.  Symptoms began after biting tongue.  Bleeding controlled.  Took aspirin last night with relief.  Denies similar symptoms in the past.  Denies fever, chills, nausea, vomiting, redness, swelling, purulent drainage, dyspnea, dysphagia  ROS: As per HPI.  All other pertinent ROS negative.     Past Medical History:  Diagnosis Date  . Anemia   . Barrett's esophagus 5/11   EGD Dr. Phillips Climes Barrett's, multiple antral/bulbar erosins, D2 erosion sec NSAIDs  . Diverticulosis 5/11   Colonoscopy Dr Gala Romney  . Family hx of colon cancer   . GERD (gastroesophageal reflux disease)   . Helicobacter pylori gastritis 5/11  . HTN (hypertension)   . Hx of adenomatous colonic polyps 8/10   Colonoscopy Dr. Gala Romney  . Hyperlipemia   . Schatzki's ring 5/11   75F dilator   Past Surgical History:  Procedure Laterality Date  . BIOPSY N/A 08/30/2016   Procedure: BIOPSY;  Surgeon: Rogene Houston, MD;  [facility];  Service: Endoscopy;  Laterality: N/A;  esophageal  . CAPSULE ENDOSCOPY OF THE SMALL BOWEL  02/17/2011      . COLONOSCOPY  05/19/2011   Procedure: COLONOSCOPY;  Surgeon: Daneil Dolin, MD;  [facility];  Service: Endoscopy;  Laterality: N/A;  8:30  . COLONOSCOPY N/A 08/21/2013   Procedure: COLONOSCOPY;  Surgeon: Rogene Houston, MD;  [facility];  Service: Endoscopy;  Laterality: N/A;  930  . COLONOSCOPY N/A 02/26/2019   Procedure: COLONOSCOPY;  Surgeon: Rogene Houston, MD;  [facility];  Service: Endoscopy;  Laterality: N/A;  9:30-rescheduled to 5/20 @ 11:15 per Lelon Frohlich  . EAR CYST EXCISION N/A 03/25/2014   Procedure: EXCISION OF NEOPLASM NECK;  Surgeon: Jamesetta So, MD;  [facility]: [facility];  Service: General;  Laterality: N/A;  . ESOPHAGOGASTRODUODENOSCOPY   02/13/2011   Procedure: ESOPHAGOGASTRODUODENOSCOPY (EGD);  Surgeon: Daneil Dolin, MD;  [facility];  Service: Endoscopy;  Laterality: N/A;  . ESOPHAGOGASTRODUODENOSCOPY N/A 08/30/2016   Procedure: ESOPHAGOGASTRODUODENOSCOPY (EGD);  Surgeon: Rogene Houston, MD;  [facility];  Service: Endoscopy;  Laterality: N/A;  730  . excisional hernia  01/2013  . GIVENS CAPSULE STUDY  02/15/2011   Procedure: GIVENS CAPSULE STUDY;  Surgeon: Daneil Dolin, MD;  [facility];  Service: Endoscopy;  Laterality: N/A;  . POLYPECTOMY  02/26/2019   Procedure: POLYPECTOMY;  Surgeon: Rogene Houston, MD;  [facility];  Service: Endoscopy;;  colon  . small intestine partial removal  06/2011   due to malignant tumor  . VASECTOMY     Allergies  Allergen Reactions  . Penicillins Itching    Did it involve swelling of the face/tongue/throat, SOB, or low BP? Unknown Did it involve sudden or severe rash/hives, skin peeling, or any reaction on the inside of your mouth or nose? Unknown Did you need to seek medical attention at a hospital or doctor's office? Unknown When did it last happen?childhood reaction  If all above answers are "NO", may proceed with cephalosporin use.   . Pantoprazole Sodium Itching and Rash   No current facility-administered medications on file prior to encounter.   Current Outpatient Medications on File Prior to Encounter  Medication Sig Dispense Refill  . amLODipine (NORVASC) 5 MG tablet Take  5 mg by mouth daily.      Marland Kitchen atorvastatin (LIPITOR) 10 MG tablet Take 10 mg by mouth daily.      . benazepril (LOTENSIN) 10 MG tablet Take 10 mg by mouth daily.      . Cholecalciferol (D3 VITAMIN PO) Take 5,000 Units by mouth daily.     . Methylcellulose, Laxative, (FIBER THERAPY PO) Take 1 tablet by mouth daily.    . Multiple Vitamin (MULTIVITAMIN WITH MINERALS) TABS tablet Take 1 tablet by mouth daily.    . Omega-3 Fatty Acids (OMEGA-3 FISH OIL PO) Take  500 mg by mouth daily.     Marland Kitchen omeprazole (PRILOSEC) 20 MG capsule Take 20 mg by mouth daily.    Marland Kitchen zolpidem (AMBIEN) 10 MG tablet Take 10 mg by mouth at bedtime as needed for sleep.      Social History   Socioeconomic History  . Marital status: Married    Spouse name: Not on file  . Number of children: 2  . Years of education: Not on file  . Highest education level: Not on file  Occupational History  . Occupation: retired    Comment: Press photographer  Tobacco Use  . Smoking status: Former Smoker    Packs/day: 1.50    Years: 30.00    Pack years: 45.00    Types: Cigarettes    Quit date: 03/18/1981    Years since quitting: 38.8  . Smokeless tobacco: Never Used  . Tobacco comment: quit 1982  Vaping Use  . Vaping Use: Never used  Substance and Sexual Activity  . Alcohol use: Yes    Comment: two drinks a day bourbon  . Drug use: No  . Sexual activity: Not on file  Other Topics Concern  . Not on file  Social History Narrative   Lives w/ wife   Social Determinants of Health   Financial Resource Strain:   . Difficulty of Paying Living Expenses:   Food Insecurity:   . Worried About Charity fundraiser in the Last Year:   . Arboriculturist in the Last Year:   Transportation Needs:   . Film/video editor (Medical):   Marland Kitchen Lack of Transportation (Non-Medical):   Physical Activity:   . Days of Exercise per Week:   . Minutes of Exercise per Session:   Stress:   . Feeling of Stress :   Social Connections:   . Frequency of Communication with Friends and Family:   . Frequency of Social Gatherings with Friends and Family:   . Attends Religious Services:   . Active Member of Clubs or Organizations:   . Attends Archivist Meetings:   Marland Kitchen Marital Status:   Intimate Partner Violence:   . Fear of Current or Ex-Partner:   . Emotionally Abused:   Marland Kitchen Physically Abused:   . Sexually Abused:    Family History  Problem Relation Age of Onset  . Diabetes Mother   . Heart failure Mother    . Prostate cancer Father 87  . Colon cancer Brother 31     OBJECTIVE:  Vitals:   01/10/20 0947  BP: 123/68  Pulse: 64  Resp: 20  Temp: 98.1 F (36.7 C)  SpO2: 95%    General appearance: alert; no distress HENT: NCAT; nares patent; oropharynx, superficial laceration apx 1 cm to tip of tongue, not through-and-through, no bleeding, no discharge, no swelling, no erythema Psychological: alert and cooperative; normal mood and affect  ASSESSMENT & PLAN:  1. Tongue  laceration, initial encounter     Meds ordered this encounter  Medications  . DISCONTD: clindamycin (CLEOCIN) 300 MG capsule    Sig: Take 1 capsule (300 mg total) by mouth 3 (three) times daily for 10 days.    Dispense:  30 capsule    Refill:  0    Order Specific Question:   Supervising Provider    Answer:   Raylene Everts [[id number]]   Rinse mouth after meals with mouthwash, or salt water Prescription for antibiotic given.  Fill if you experience swelling, redness, pain, etc... Use OTC tylenol for pain relief Follow up with PCP as needed Return or go to the ED if you have any new or worsening symptoms such as increased pain, redness, swelling, drainage, discharge, etc..     Reviewed expectations re: course of current medical issues. Questions answered. Outlined signs and symptoms indicating need for more acute intervention. Patient verbalized understanding. After Visit Summary given.   Lestine Box, PA-C 01/10/20 1034

## 2020-02-23 DIAGNOSIS — L2089 Other atopic dermatitis: Secondary | ICD-10-CM | POA: Diagnosis not present

## 2020-02-23 DIAGNOSIS — L821 Other seborrheic keratosis: Secondary | ICD-10-CM | POA: Diagnosis not present

## 2020-03-01 ENCOUNTER — Other Ambulatory Visit: Payer: Self-pay

## 2020-03-01 ENCOUNTER — Ambulatory Visit
Admission: EM | Admit: 2020-03-01 | Discharge: 2020-03-01 | Disposition: A | Discharge status: Home / Self Care | Payer: Medicare Other | Attending: Emergency Medicine | Admitting: Emergency Medicine

## 2020-03-01 DIAGNOSIS — Z20822 Contact with and (suspected) exposure to covid-19: Secondary | ICD-10-CM | POA: Diagnosis not present

## 2020-03-01 NOTE — Discharge Instructions (Signed)

## 2020-03-01 NOTE — ED Triage Notes (Signed)
Pt presents for COVID testing after exposure 4 days ago. Denies any symptoms.

## 2020-03-01 NOTE — ED Provider Notes (Signed)
David Weiss   [id number] 03/01/20 Arrival Time: 1600   CC: COVID exposure   SUBJECTIVE: History from: patient.  David Weiss is a 74 y.o. male who presents for COVID testing.  COVID exposure 4 days ago.  Denies aggravating or alleviating symptoms.  Denies previous COVID infection.   Denies fever, chills, fatigue, nasal congestion, rhinorrhea, sore throat, cough, SOB, wheezing, chest pain, nausea, vomiting, changes in bowel or bladder habits.     Had COVID vaccine  ROS: As per HPI.  All other pertinent ROS negative.     Past Medical History:  Diagnosis Date  . Anemia   . Barrett's esophagus 5/11   EGD Dr. Phillips Climes Barrett's, multiple antral/bulbar erosins, D2 erosion sec NSAIDs  . Diverticulosis 5/11   Colonoscopy Dr Gala Romney  . Family hx of colon cancer   . GERD (gastroesophageal reflux disease)   . Helicobacter pylori gastritis 5/11  . HTN (hypertension)   . Hx of adenomatous colonic polyps 8/10   Colonoscopy Dr. Gala Romney  . Hyperlipemia   . Schatzki's ring 5/11   78F dilator   Past Surgical History:  Procedure Laterality Date  . BIOPSY N/A 08/30/2016   Procedure: BIOPSY;  Surgeon: Rogene Houston, MD;  [facility]: [facility];  Service: Endoscopy;  Laterality: N/A;  esophageal  . CAPSULE ENDOSCOPY OF THE SMALL BOWEL  02/17/2011      . COLONOSCOPY  05/19/2011   Procedure: COLONOSCOPY;  Surgeon: Daneil Dolin, MD;  Location: [facility];  Service: Endoscopy;  Laterality: N/A;  8:30  . COLONOSCOPY N/A 08/21/2013   Procedure: COLONOSCOPY;  Surgeon: Rogene Houston, MD;  [facility]: [facility];  Service: Endoscopy;  Laterality: N/A;  930  . COLONOSCOPY N/A 02/26/2019   Procedure: COLONOSCOPY;  Surgeon: Rogene Houston, MD;  [facility]: [facility];  Service: Endoscopy;  Laterality: N/A;  9:30-rescheduled to 5/20 @ 11:15 per Lelon Frohlich  . EAR CYST EXCISION N/A 03/25/2014   Procedure: EXCISION OF NEOPLASM NECK;  Surgeon: Jamesetta So, MD;  Location: [facility];  Service:  General;  Laterality: N/A;  . ESOPHAGOGASTRODUODENOSCOPY  02/13/2011   Procedure: ESOPHAGOGASTRODUODENOSCOPY (EGD);  Surgeon: Daneil Dolin, MD;  [facility]: [facility];  Service: Endoscopy;  Laterality: N/A;  . ESOPHAGOGASTRODUODENOSCOPY N/A 08/30/2016   Procedure: ESOPHAGOGASTRODUODENOSCOPY (EGD);  Surgeon: Rogene Houston, MD;  [facility]: [facility];  Service: Endoscopy;  Laterality: N/A;  730  . excisional hernia  01/2013  . GIVENS CAPSULE STUDY  02/15/2011   Procedure: GIVENS CAPSULE STUDY;  Surgeon: Daneil Dolin, MD;  [facility]: [facility];  Service: Endoscopy;  Laterality: N/A;  . POLYPECTOMY  02/26/2019   Procedure: POLYPECTOMY;  Surgeon: Rogene Houston, MD;  [facility]: [facility];  Service: Endoscopy;;  colon  . small intestine partial removal  06/2011   due to malignant tumor  . VASECTOMY     Allergies  Allergen Reactions  . Penicillins Itching    Did it involve swelling of the face/tongue/throat, SOB, or low BP? Unknown Did it involve sudden or severe rash/hives, skin peeling, or any reaction on the inside of your mouth or nose? Unknown Did you need to seek medical attention at a hospital or doctor's office? Unknown When did it last happen?childhood reaction  If all above answers are "NO", may proceed with cephalosporin use.   . Pantoprazole Sodium Itching and Rash   No current facility-administered medications on file prior to encounter.   Current Outpatient Medications on File Prior  to Encounter  Medication Sig Dispense Refill  . amLODipine (NORVASC) 5 MG tablet Take 5 mg by mouth daily.      Marland Kitchen atorvastatin (LIPITOR) 10 MG tablet Take 10 mg by mouth daily.      . benazepril (LOTENSIN) 10 MG tablet Take 10 mg by mouth daily.      . Cholecalciferol (D3 VITAMIN PO) Take 5,000 Units by mouth daily.     . Methylcellulose, Laxative, (FIBER THERAPY PO) Take 1 tablet by mouth daily.    . Multiple Vitamin (MULTIVITAMIN WITH MINERALS) TABS tablet Take 1 tablet by mouth  daily.    . Omega-3 Fatty Acids (OMEGA-3 FISH OIL PO) Take 500 mg by mouth daily.     Marland Kitchen omeprazole (PRILOSEC) 20 MG capsule Take 20 mg by mouth daily.    Marland Kitchen zolpidem (AMBIEN) 10 MG tablet Take 10 mg by mouth at bedtime as needed for sleep.      Social History   Socioeconomic History  . Marital status: Married    Spouse name: Not on file  . Number of children: 2  . Years of education: Not on file  . Highest education level: Not on file  Occupational History  . Occupation: retired    Comment: Press photographer  Tobacco Use  . Smoking status: Former Smoker    Packs/day: 1.50    Years: 30.00    Pack years: 45.00    Types: Cigarettes    Quit date: 03/18/1981    Years since quitting: 38.9  . Smokeless tobacco: Never Used  . Tobacco comment: quit 1982  Vaping Use  . Vaping Use: Never used  Substance and Sexual Activity  . Alcohol use: Yes    Comment: two drinks a day bourbon  . Drug use: No  . Sexual activity: Not on file  Other Topics Concern  . Not on file  Social History Narrative   Lives w/ wife   Social Determinants of Health   Financial Resource Strain:   . Difficulty of Paying Living Expenses:   Food Insecurity:   . Worried About Charity fundraiser in the Last Year:   . Arboriculturist in the Last Year:   Transportation Needs:   . Film/video editor (Medical):   Marland Kitchen Lack of Transportation (Non-Medical):   Physical Activity:   . Days of Exercise per Week:   . Minutes of Exercise per Session:   Stress:   . Feeling of Stress :   Social Connections:   . Frequency of Communication with Friends and Family:   . Frequency of Social Gatherings with Friends and Family:   . Attends Religious Services:   . Active Member of Clubs or Organizations:   . Attends Archivist Meetings:   Marland Kitchen Marital Status:   Intimate Partner Violence:   . Fear of Current or Ex-Partner:   . Emotionally Abused:   Marland Kitchen Physically Abused:   . Sexually Abused:    Family History  Problem Relation  Age of Onset  . Diabetes Mother   . Heart failure Mother   . Prostate cancer Father 26  . Colon cancer Brother 48    OBJECTIVE:  Vitals:   03/01/20 1612  BP: 138/71  Pulse: 62  Resp: 18  Temp: 98.6 F (37 C)  SpO2: 94%     General appearance: alert; well-appearing, nontoxic; speaking in full sentences and tolerating own secretions HEENT: NCAT; Ears: EACs clear; Eyes: PERRL.  EOM grossly intact.Nose: nares patent without rhinorrhea, Throat:  oropharynx clear, tonsils non erythematous or enlarged, uvula midline  Neck: supple without LAD Lungs: unlabored respirations, symmetrical air entry; cough: absent; no respiratory distress; CTAB Heart: regular rate and rhythm.  Skin: warm and dry Psychological: alert and cooperative; normal mood and affect   ASSESSMENT & PLAN:  1. Contact with and (suspected) exposure to covid-19   2. Exposure to COVID-19 virus   3. Encounter for laboratory testing for COVID-19 virus     COVID testing ordered.  It will take between 2-5 days for test results.  Someone will contact you regarding abnormal results.    In the meantime:  If you were to develop symptoms: You should remain isolated in your home for 10 days from symptom onset AND greater than 72 hours after symptoms resolution (absence of fever without the use of fever-reducing medication and improvement in respiratory symptoms), whichever is longer If you have had exposure: you should remain in quarantine for 7 days from exposure.  However, if symptoms develop you must self isolate and return for retesting Get plenty of rest and push fluids Follow up with PCP as needed Call or go to the ED if you have any new symptoms such as fever, cough, shortness of breath, chest tightness, chest pain, turning blue, changes in mental status, etc...   Reviewed expectations re: course of current medical issues. Questions answered. Outlined signs and symptoms indicating need for more acute intervention. Patient  verbalized understanding. After Visit Summary given.         Lestine Box, PA-C 03/01/20 1631

## 2020-03-02 LAB — NOVEL CORONAVIRUS, NAA: SARS-CoV-2, NAA: NOT DETECTED

## 2020-03-02 LAB — SARS-COV-2, NAA 2 DAY TAT

## 2020-04-26 ENCOUNTER — Other Ambulatory Visit: Payer: Self-pay

## 2020-04-26 ENCOUNTER — Other Ambulatory Visit: Payer: Medicare Other

## 2020-04-26 DIAGNOSIS — Z20822 Contact with and (suspected) exposure to covid-19: Secondary | ICD-10-CM

## 2020-04-28 LAB — SPECIMEN STATUS REPORT

## 2020-04-28 LAB — NOVEL CORONAVIRUS, NAA: SARS-CoV-2, NAA: NOT DETECTED

## 2020-04-28 LAB — SARS-COV-2, NAA 2 DAY TAT

## 2020-04-29 DIAGNOSIS — R7301 Impaired fasting glucose: Secondary | ICD-10-CM | POA: Diagnosis not present

## 2020-04-29 DIAGNOSIS — I10 Essential (primary) hypertension: Secondary | ICD-10-CM | POA: Diagnosis not present

## 2020-04-29 DIAGNOSIS — Z125 Encounter for screening for malignant neoplasm of prostate: Secondary | ICD-10-CM | POA: Diagnosis not present

## 2020-04-29 DIAGNOSIS — E785 Hyperlipidemia, unspecified: Secondary | ICD-10-CM | POA: Diagnosis not present

## 2020-04-29 DIAGNOSIS — Z79899 Other long term (current) drug therapy: Secondary | ICD-10-CM | POA: Diagnosis not present

## 2020-05-18 DIAGNOSIS — R454 Irritability and anger: Secondary | ICD-10-CM | POA: Diagnosis not present

## 2020-05-18 DIAGNOSIS — I10 Essential (primary) hypertension: Secondary | ICD-10-CM | POA: Diagnosis not present

## 2020-05-18 DIAGNOSIS — E785 Hyperlipidemia, unspecified: Secondary | ICD-10-CM | POA: Diagnosis not present

## 2020-05-18 DIAGNOSIS — Z23 Encounter for immunization: Secondary | ICD-10-CM | POA: Diagnosis not present

## 2020-05-18 DIAGNOSIS — R001 Bradycardia, unspecified: Secondary | ICD-10-CM | POA: Diagnosis not present

## 2020-06-22 DIAGNOSIS — Z23 Encounter for immunization: Secondary | ICD-10-CM | POA: Diagnosis not present

## 2020-07-20 DIAGNOSIS — C49A0 Gastrointestinal stromal tumor, unspecified site: Secondary | ICD-10-CM | POA: Diagnosis not present

## 2020-07-20 DIAGNOSIS — R454 Irritability and anger: Secondary | ICD-10-CM | POA: Diagnosis not present

## 2020-08-19 DIAGNOSIS — L304 Erythema intertrigo: Secondary | ICD-10-CM | POA: Diagnosis not present

## 2020-08-19 DIAGNOSIS — L308 Other specified dermatitis: Secondary | ICD-10-CM | POA: Diagnosis not present

## 2021-01-01 ENCOUNTER — Encounter: Payer: Self-pay | Admitting: Emergency Medicine

## 2021-01-01 ENCOUNTER — Ambulatory Visit (INDEPENDENT_AMBULATORY_CARE_PROVIDER_SITE_OTHER): Payer: Medicare Other

## 2021-01-01 ENCOUNTER — Ambulatory Visit
Admission: EM | Admit: 2021-01-01 | Discharge: 2021-01-01 | Disposition: A | Discharge status: Home / Self Care | Payer: Medicare Other | Attending: Emergency Medicine | Admitting: Emergency Medicine

## 2021-01-01 ENCOUNTER — Other Ambulatory Visit: Payer: Self-pay

## 2021-01-01 DIAGNOSIS — R0782 Intercostal pain: Secondary | ICD-10-CM | POA: Diagnosis not present

## 2021-01-01 DIAGNOSIS — W11XXXA Fall on and from ladder, initial encounter: Secondary | ICD-10-CM | POA: Diagnosis not present

## 2021-01-01 DIAGNOSIS — M79602 Pain in left arm: Secondary | ICD-10-CM

## 2021-01-01 DIAGNOSIS — R0781 Pleurodynia: Secondary | ICD-10-CM | POA: Diagnosis not present

## 2021-01-01 DIAGNOSIS — M25512 Pain in left shoulder: Secondary | ICD-10-CM | POA: Diagnosis not present

## 2021-01-01 MED ORDER — TRAMADOL HCL 50 MG PO TABS: 50.0000 mg | ORAL_TABLET | Freq: Four times a day (QID) | ORAL | 0 refills | Status: DC | PRN

## 2021-01-01 NOTE — Discharge Instructions (Addendum)
Tramadol was prescribed to take for severe pain Continue to take OTC Tylenol for mild or moderate pain Follow RICE instructions that is attached Follow-up with PCP Return or go to ED if you develop any new or worsening of your symptoms

## 2021-01-01 NOTE — ED Triage Notes (Signed)
Patient states that he fell off a 6 foot ladder today and landed on his left side (under arm).  No loss of consciousness.  Patient did take Tylenol.

## 2021-01-01 NOTE — ED Provider Notes (Signed)
[place]   [id number] 01/01/21 Arrival Time: [place]   Chief Complaint  Patient presents with  . Fall     SUBJECTIVE: History from: patient.  David Weiss is a 75 y.o. male who presented to the urgent care with a complaint of fall from ladder that occurred today.  He reports pain on his left shoulder/ arm developed the symptom after falling from ladder  about 3 feet.  He describes the pain as constant and achy.  He has tried OTC medications without relief.  His symptoms are made worse with ROM.  He denies similar symptoms in the past.  He denies chills, fever, nausea, vomiting, diarrhea  ROS: As per HPI.  All other pertinent ROS negative.     Past Medical History:  Diagnosis Date  . Anemia   . Barrett's esophagus 5/11   EGD Dr. Phillips Climes Barrett's, multiple antral/bulbar erosins, D2 erosion sec NSAIDs  . Diverticulosis 5/11   Colonoscopy Dr Gala Romney  . Family hx of colon cancer   . GERD (gastroesophageal reflux disease)   . Helicobacter pylori gastritis 5/11  . HTN (hypertension)   . Hx of adenomatous colonic polyps 8/10   Colonoscopy Dr. Gala Romney  . Hyperlipemia   . Schatzki's ring 5/11   40F dilator   Past Surgical History:  Procedure Laterality Date  . BIOPSY N/A 08/30/2016   Procedure: BIOPSY;  Surgeon: Rogene Houston, MD;  [facility];  Service: Endoscopy;  Laterality: N/A;  esophageal  . CAPSULE ENDOSCOPY OF THE SMALL BOWEL  02/17/2011      . COLONOSCOPY  05/19/2011   Procedure: COLONOSCOPY;  Surgeon: Daneil Dolin, MD;  [facility];  Service: Endoscopy;  Laterality: N/A;  8:30  . COLONOSCOPY N/A 08/21/2013   Procedure: COLONOSCOPY;  Surgeon: Rogene Houston, MD;  [facility];  Service: Endoscopy;  Laterality: N/A;  930  . COLONOSCOPY N/A 02/26/2019   Procedure: COLONOSCOPY;  Surgeon: Rogene Houston, MD;  [facility];  Service: Endoscopy;  Laterality: N/A;  9:30-rescheduled to 5/20 @ 11:15 per Lelon Frohlich  . EAR  CYST EXCISION N/A 03/25/2014   Procedure: EXCISION OF NEOPLASM NECK;  Surgeon: Jamesetta So, MD;  Location: [facility];  Service: General;  Laterality: N/A;  . ESOPHAGOGASTRODUODENOSCOPY  02/13/2011   Procedure: ESOPHAGOGASTRODUODENOSCOPY (EGD);  Surgeon: Daneil Dolin, MD;  [facility];  Service: Endoscopy;  Laterality: N/A;  . ESOPHAGOGASTRODUODENOSCOPY N/A 08/30/2016   Procedure: ESOPHAGOGASTRODUODENOSCOPY (EGD);  Surgeon: Rogene Houston, MD;  [facility];  Service: Endoscopy;  Laterality: N/A;  730  . excisional hernia  01/2013  . GIVENS CAPSULE STUDY  02/15/2011   Procedure: GIVENS CAPSULE STUDY;  Surgeon: Daneil Dolin, MD;  [facility];  Service: Endoscopy;  Laterality: N/A;  . POLYPECTOMY  02/26/2019   Procedure: POLYPECTOMY;  Surgeon: Rogene Houston, MD;  [facility];  Service: Endoscopy;;  colon  . small intestine partial removal  06/2011   due to malignant tumor  . VASECTOMY     Allergies  Allergen Reactions  . Penicillins Itching    Did it involve swelling of the face/tongue/throat, SOB, or low BP? Unknown Did it involve sudden or severe rash/hives, skin peeling, or any reaction on the inside of your mouth or nose? Unknown Did you need to seek medical attention at a hospital or doctor's office? Unknown When did it last happen?childhood reaction  If all above answers are "NO",  may proceed with cephalosporin use.   . Pantoprazole Sodium Itching and Rash   No current facility-administered medications on file prior to encounter.   Current Outpatient Medications on File Prior to Encounter  Medication Sig Dispense Refill  . amLODipine (NORVASC) 5 MG tablet Take 5 mg by mouth daily.    Marland Kitchen atorvastatin (LIPITOR) 10 MG tablet Take 10 mg by mouth daily.    . benazepril (LOTENSIN) 10 MG tablet Take 10 mg by mouth daily.    . Cholecalciferol (D3 VITAMIN PO) Take 5,000 Units by mouth daily.     Marland Kitchen omeprazole (PRILOSEC) 20 MG capsule Take 20  mg by mouth daily.    Marland Kitchen zolpidem (AMBIEN) 10 MG tablet Take 10 mg by mouth at bedtime as needed for sleep.     . Methylcellulose, Laxative, (FIBER THERAPY PO) Take 1 tablet by mouth daily.    . Multiple Vitamin (MULTIVITAMIN WITH MINERALS) TABS tablet Take 1 tablet by mouth daily.    . Omega-3 Fatty Acids (OMEGA-3 FISH OIL PO) Take 500 mg by mouth daily.      Social History   Socioeconomic History  . Marital status: Married    Spouse name: Not on file  . Number of children: 2  . Years of education: Not on file  . Highest education level: Not on file  Occupational History  . Occupation: retired    Comment: Press photographer  Tobacco Use  . Smoking status: Former Smoker    Packs/day: 1.50    Years: 30.00    Pack years: 45.00    Types: Cigarettes    Quit date: 03/18/1981    Years since quitting: 39.8  . Smokeless tobacco: Never Used  . Tobacco comment: quit 1982  Vaping Use  . Vaping Use: Never used  Substance and Sexual Activity  . Alcohol use: Yes    Comment: two drinks a day bourbon  . Drug use: No  . Sexual activity: Not on file  Other Topics Concern  . Not on file  Social History Narrative   Lives w/ wife   Social Determinants of Health   Financial Resource Strain: Not on file  Food Insecurity: Not on file  Transportation Needs: Not on file  Physical Activity: Not on file  Stress: Not on file  Social Connections: Not on file  Intimate Partner Violence: Not on file   Family History  Problem Relation Age of Onset  . Diabetes Mother   . Heart failure Mother   . Prostate cancer Father 29  . Colon cancer Brother 58    OBJECTIVE:  Vitals:   01/01/21 1356  BP: (!) 157/64  Pulse: (!) 59  Temp: 97.7 F (36.5 C)  TempSrc: Oral  SpO2: 97%     Physical Exam Vitals and nursing note reviewed.  Constitutional:      General: He is not in acute distress.    Appearance: Normal appearance. He is normal weight. He is not ill-appearing, toxic-appearing or diaphoretic.   Cardiovascular:     Rate and Rhythm: Normal rate and regular rhythm.     Pulses: Normal pulses.     Heart sounds: Normal heart sounds. No murmur heard. No friction rub. No gallop.   Pulmonary:     Effort: Pulmonary effort is normal. No respiratory distress.     Breath sounds: Normal breath sounds. No stridor. No wheezing, rhonchi or rales.  Chest:     Chest wall: No tenderness.  Musculoskeletal:     Left upper arm: Tenderness present.  Neurological:     Mental Status: He is alert and oriented to person, place, and time.      LABS:  No results found for this or any previous visit (from the past 24 hour(s)).    DG Ribs Unilateral W/Chest Left  Result Date: 01/01/2021 CLINICAL DATA:  Fall from a ladder with left sided pain. EXAM: LEFT RIBS AND CHEST - 3+ VIEW COMPARISON:  Chest radiograph dated 06/16/2011. FINDINGS: No acute fracture involving the ribs. A chronic appearing fracture of the right sixth rib is noted. There is no evidence of pneumothorax or pleural effusion. Both lungs are clear. Heart size and mediastinal contours are within normal limits. IMPRESSION: No acute fracture of the ribs. A chronic appearing right sixth rib fracture is noted. Electronically Signed   By: Zerita Boers M.D.   On: 01/01/2021 14:20   Left chest X-ray is negative for bony abnormality including fracture or dislocation.  I have reviewed the x-ray myself and the radiologist interpretation.  I am in agreement with the radiologist interpretation.  ASSESSMENT & PLAN:  1. Left arm pain   2. Acute pain of left shoulder   3. Fall from ladder, initial encounter     Meds ordered this encounter  Medications  . traMADol (ULTRAM) 50 MG tablet    Sig: Take 1 tablet (50 mg total) by mouth every 6 (six) hours as needed.    Dispense:  15 tablet    Refill:  0    Discharge instructions  Tramadol was prescribed to take for severe pain Continue to take OTC Tylenol for mild or moderate pain Follow RICE  instructions that is attached Follow-up with PCP Return or go to ED if you develop any new or worsening of your symptoms  Reviewed expectations re: course of current medical issues. Questions answered. Outlined signs and symptoms indicating need for more acute intervention. Patient verbalized understanding. After Visit Summary given.   PDMP reviewed during this encounter.       Emerson Monte, [place] 01/01/21 [phone]

## 2021-01-18 DIAGNOSIS — Z9889 Other specified postprocedural states: Secondary | ICD-10-CM | POA: Diagnosis not present

## 2021-01-18 DIAGNOSIS — H02831 Dermatochalasis of right upper eyelid: Secondary | ICD-10-CM | POA: Diagnosis not present

## 2021-01-18 DIAGNOSIS — H25812 Combined forms of age-related cataract, left eye: Secondary | ICD-10-CM | POA: Diagnosis not present

## 2021-01-18 DIAGNOSIS — H25811 Combined forms of age-related cataract, right eye: Secondary | ICD-10-CM | POA: Diagnosis not present

## 2021-01-20 DIAGNOSIS — I10 Essential (primary) hypertension: Secondary | ICD-10-CM | POA: Diagnosis not present

## 2021-01-20 DIAGNOSIS — R079 Chest pain, unspecified: Secondary | ICD-10-CM | POA: Diagnosis not present

## 2021-01-31 ENCOUNTER — Ambulatory Visit
Admission: EM | Admit: 2021-01-31 | Discharge: 2021-01-31 | Disposition: A | Discharge status: Home / Self Care | Payer: Medicare Other

## 2021-01-31 ENCOUNTER — Other Ambulatory Visit: Payer: Self-pay

## 2021-01-31 DIAGNOSIS — S91332A Puncture wound without foreign body, left foot, initial encounter: Secondary | ICD-10-CM

## 2021-01-31 NOTE — ED Triage Notes (Signed)
Pt presents with small laceration to top of left foot from dropping scissors this morning

## 2021-01-31 NOTE — ED Provider Notes (Addendum)
[facility]    CSN: [id number] Arrival date & time: 01/31/21  0920      History   Chief Complaint Chief Complaint  Patient presents with   Laceration    HPI David Weiss is a 75 y.o. male.   HPI Patient presents today for evaluation of a laceration.  Patient was trimming his mustache and subsequently dropped his scissors which punctured his left foot.  He is up-to-date on his tetanus.  He reports a large amount of bleeding when injury initially occurred however bleeding has resolved since arrival here in clinic.  Patient has full sensation in foot.  However has some trace swelling at site of injury.  Past Medical History:  Diagnosis Date   Anemia    Barrett's esophagus 5/11   EGD Dr. Phillips Climes Barrett's, multiple antral/bulbar erosins, D2 erosion sec NSAIDs   Diverticulosis 5/11   Colonoscopy Dr Gala Romney   Family hx of colon cancer    GERD (gastroesophageal reflux disease)    Helicobacter pylori gastritis 5/11   HTN (hypertension)    Hx of adenomatous colonic polyps 8/10   Colonoscopy Dr. Gala Romney   Hyperlipemia    Schatzki's ring 5/11   21F dilator    Patient Active Problem List   Diagnosis Date Noted   Hx of colonic polyps 08/07/2018   Barrett's esophagus without dysplasia 05/10/2016   Incisional hernia 02/08/2013   GIST (gastrointestinal stromal tumor) of small bowel, malignant ([place]) 07/04/2011   Status post small bowel resection 06/22/2011   Abdominal mass 06/19/2011   Essential hypertension 06/19/2011   GI bleeding 06/17/2011   Acute blood loss anemia 06/17/2011   Syncope 06/17/2011   Iron deficiency anemia 04/19/2011   Schatzki's ring 01/03/2011   Colon adenoma 01/03/2011   Family hx of colon cancer 03/50/0938   HELICOBACTER PYLORI GASTRITIS 12/22/2009   BARRETTS ESOPHAGUS 12/22/2009    Past Surgical History:  Procedure Laterality Date   BIOPSY N/A 08/30/2016   Procedure: BIOPSY;  Surgeon: Rogene Houston, MD;  [facility];   Service: Endoscopy;  Laterality: N/A;  esophageal   CAPSULE ENDOSCOPY OF THE SMALL BOWEL  02/17/2011       COLONOSCOPY  05/19/2011   Procedure: COLONOSCOPY;  Surgeon: Daneil Dolin, MD;  [facility];  Service: Endoscopy;  Laterality: N/A;  8:30   COLONOSCOPY N/A 08/21/2013   Procedure: COLONOSCOPY;  Surgeon: Rogene Houston, MD;  [facility];  Service: Endoscopy;  Laterality: N/A;  930   COLONOSCOPY N/A 02/26/2019   Procedure: COLONOSCOPY;  Surgeon: Rogene Houston, MD;  [facility];  Service: Endoscopy;  Laterality: N/A;  9:30-rescheduled to 5/20 @ 11:15 per Lelon Frohlich   EAR CYST EXCISION N/A 03/25/2014   Procedure: EXCISION OF NEOPLASM NECK;  Surgeon: Jamesetta So, MD;  Location: [facility];  Service: General;  Laterality: N/A;   ESOPHAGOGASTRODUODENOSCOPY  02/13/2011   Procedure: ESOPHAGOGASTRODUODENOSCOPY (EGD);  Surgeon: Daneil Dolin, MD;  [facility];  Service: Endoscopy;  Laterality: N/A;   ESOPHAGOGASTRODUODENOSCOPY N/A 08/30/2016   Procedure: ESOPHAGOGASTRODUODENOSCOPY (EGD);  Surgeon: Rogene Houston, MD;  [facility];  Service: Endoscopy;  Laterality: N/A;  730   excisional hernia  01/2013   GIVENS CAPSULE STUDY  02/15/2011   Procedure: GIVENS CAPSULE STUDY;  Surgeon: Daneil Dolin, MD;  [facility];  Service: Endoscopy;  Laterality: N/A;   POLYPECTOMY  02/26/2019   Procedure: POLYPECTOMY;  Surgeon: Rogene Houston, MD;  [facility];  Service: Endoscopy;;  colon   small intestine partial removal  06/2011   due to malignant tumor   VASECTOMY         Home Medications    Prior to Admission medications   Medication Sig Start Date End Date Taking? Authorizing Provider  amLODipine (NORVASC) 5 MG tablet Take 5 mg by mouth daily.    [provider]  atorvastatin (LIPITOR) 10 MG tablet Take 10 mg by mouth daily.    [provider]  benazepril (LOTENSIN) 10 MG tablet Take 10 mg by mouth daily.     [provider]  Cholecalciferol (D3 VITAMIN PO) Take 5,000 Units by mouth daily.     [provider]  Methylcellulose, Laxative, (FIBER THERAPY PO) Take 1 tablet by mouth daily.    [provider]  Multiple Vitamin (MULTIVITAMIN WITH MINERALS) TABS tablet Take 1 tablet by mouth daily.    [provider]  Omega-3 Fatty Acids (OMEGA-3 FISH OIL PO) Take 500 mg by mouth daily.     [provider]  omeprazole (PRILOSEC) 20 MG capsule Take 20 mg by mouth daily.    [provider]  traMADol (ULTRAM) 50 MG tablet Take 1 tablet (50 mg total) by mouth every 6 (six) hours as needed. 01/01/21   Avegno, Darrelyn Hillock, FNP  zolpidem (AMBIEN) 10 MG tablet Take 10 mg by mouth at bedtime as needed for sleep.  11/13/18   [provider]    Family History Family History  Problem Relation Age of Onset   Diabetes Mother    Heart failure Mother    Prostate cancer Father 23   Colon cancer Brother 32    Social History Social History   Tobacco Use   Smoking status: Former    Packs/day: 1.50    Years: 30.00    Pack years: 45.00    Types: Cigarettes    Quit date: 03/18/1981    Years since quitting: 39.9   Smokeless tobacco: Never   Tobacco comments:    quit 1982  Vaping Use   Vaping Use: Never used  Substance Use Topics   Alcohol use: Yes    Comment: two drinks a day bourbon   Drug use: No     Allergies   Penicillins and Pantoprazole sodium   Review of Systems Review of Systems Pertinent negatives listed in HPI   Physical Exam Triage Vital Signs ED Triage Vitals  Enc Vitals Group     BP 01/31/21 0928 (!) 158/75     Pulse Rate 01/31/21 0928 67     Resp 01/31/21 0928 20     Temp 01/31/21 0928 98 F (36.7 C)     Temp src --      SpO2 01/31/21 0928 95 %     Weight --      Height --      Head Circumference --      Peak Flow --      Pain Score 01/31/21 0927 5     Pain Loc --      Pain Edu? --      Excl. in [place]? --    No  data found.  Updated Vital Signs BP (!) 158/75   Pulse 67   Temp 98 F (36.7 C)   Resp 20   SpO2 95%   Visual Acuity Right Eye Distance:   Left Eye Distance:   Bilateral Distance:    Right Eye Near:   Left  Eye Near:    Bilateral Near:     Physical Exam Constitutional:      General: He is not in acute distress.    Appearance: He is not ill-appearing.  Cardiovascular:     Rate and Rhythm: Normal rate and regular rhythm.  Pulmonary:     Effort: Pulmonary effort is normal.     Breath sounds: Normal breath sounds.  Musculoskeletal:     Left foot: Tenderness present. Normal pulse.       Legs:     Comments: Hematoma present surrounding the surface of the puncture wound. Puncture wound 52mm in diameter  Neurological:     Mental Status: He is alert.    UC Treatments / Results  Labs (all labs ordered are listed, but only abnormal results are displayed) Labs Reviewed - No data to display  EKG   Radiology No results found.  Procedures Procedures (including critical care time)  Medications Ordered in UC Medications - No data to display  Initial Impression / Assessment and Plan / UC Course  I have reviewed the triage vital signs and the nursing notes.  Pertinent labs & imaging results that were available during my care of the patient were reviewed by me and considered in my medical decision making (see chart for details).     Patient with a uncomplicated puncture wound resulting from accidental stab injury after dropping scissors.  Bleeding is well controlled.  Patient has a small hematoma.  Instructed to change dressing twice daily and apply Neosporin to the wound.  Return precautions if symptoms worsen. Final Clinical Impressions(s) / UC Diagnoses   Final diagnoses:  Puncture wound of left foot, initial encounter     Discharge Instructions      Apply Neosporin to puncture wound twice daily and change dressing twice daily.  You may continue to see some  residual bruising beneath the skin as you sustained a hematoma due to the injury to the blood vessel.  This will resolve in time.  Once the puncture wound has completely closed you can discontinue dressings and Neosporin.     ED Prescriptions   None    PDMP not reviewed this encounter.   Scot Jun, FNP 01/31/21 1008    Scot Jun, [place] 01/31/21 1008

## 2021-01-31 NOTE — Discharge Instructions (Addendum)
Apply Neosporin to puncture wound twice daily and change dressing twice daily.  You may continue to see some residual bruising beneath the skin as you sustained a hematoma due to the injury to the blood vessel.  This will resolve in time.  Once the puncture wound has completely closed you can discontinue dressings and Neosporin.

## 2021-02-03 DIAGNOSIS — H25811 Combined forms of age-related cataract, right eye: Secondary | ICD-10-CM | POA: Diagnosis not present

## 2021-02-03 DIAGNOSIS — H2511 Age-related nuclear cataract, right eye: Secondary | ICD-10-CM | POA: Diagnosis not present

## 2021-02-03 DIAGNOSIS — Z01818 Encounter for other preprocedural examination: Secondary | ICD-10-CM | POA: Diagnosis not present

## 2021-02-03 DIAGNOSIS — H52223 Regular astigmatism, bilateral: Secondary | ICD-10-CM | POA: Diagnosis not present

## 2021-02-24 DIAGNOSIS — H2512 Age-related nuclear cataract, left eye: Secondary | ICD-10-CM | POA: Diagnosis not present

## 2021-02-24 DIAGNOSIS — H25812 Combined forms of age-related cataract, left eye: Secondary | ICD-10-CM | POA: Diagnosis not present

## 2021-07-12 DIAGNOSIS — Z125 Encounter for screening for malignant neoplasm of prostate: Secondary | ICD-10-CM | POA: Diagnosis not present

## 2021-07-12 DIAGNOSIS — I10 Essential (primary) hypertension: Secondary | ICD-10-CM | POA: Diagnosis not present

## 2021-07-12 DIAGNOSIS — R7301 Impaired fasting glucose: Secondary | ICD-10-CM | POA: Diagnosis not present

## 2021-07-12 DIAGNOSIS — E785 Hyperlipidemia, unspecified: Secondary | ICD-10-CM | POA: Diagnosis not present

## 2021-07-12 DIAGNOSIS — Z79899 Other long term (current) drug therapy: Secondary | ICD-10-CM | POA: Diagnosis not present

## 2021-07-12 DIAGNOSIS — K2270 Barrett's esophagus without dysplasia: Secondary | ICD-10-CM | POA: Diagnosis not present

## 2021-07-26 ENCOUNTER — Encounter (INDEPENDENT_AMBULATORY_CARE_PROVIDER_SITE_OTHER): Payer: Self-pay | Admitting: *Deleted

## 2021-08-03 DIAGNOSIS — M9902 Segmental and somatic dysfunction of thoracic region: Secondary | ICD-10-CM | POA: Diagnosis not present

## 2021-08-03 DIAGNOSIS — M9903 Segmental and somatic dysfunction of lumbar region: Secondary | ICD-10-CM | POA: Diagnosis not present

## 2021-08-03 DIAGNOSIS — M5441 Lumbago with sciatica, right side: Secondary | ICD-10-CM | POA: Diagnosis not present

## 2021-08-03 DIAGNOSIS — M9905 Segmental and somatic dysfunction of pelvic region: Secondary | ICD-10-CM | POA: Diagnosis not present

## 2021-08-03 DIAGNOSIS — I10 Essential (primary) hypertension: Secondary | ICD-10-CM | POA: Diagnosis not present

## 2021-08-04 DIAGNOSIS — I10 Essential (primary) hypertension: Secondary | ICD-10-CM | POA: Diagnosis not present

## 2021-08-04 DIAGNOSIS — Z23 Encounter for immunization: Secondary | ICD-10-CM | POA: Diagnosis not present

## 2021-08-04 DIAGNOSIS — I700 Atherosclerosis of aorta: Secondary | ICD-10-CM | POA: Diagnosis not present

## 2021-08-04 DIAGNOSIS — E785 Hyperlipidemia, unspecified: Secondary | ICD-10-CM | POA: Diagnosis not present

## 2021-08-05 DIAGNOSIS — M9905 Segmental and somatic dysfunction of pelvic region: Secondary | ICD-10-CM | POA: Diagnosis not present

## 2021-08-05 DIAGNOSIS — M9903 Segmental and somatic dysfunction of lumbar region: Secondary | ICD-10-CM | POA: Diagnosis not present

## 2021-08-05 DIAGNOSIS — M9902 Segmental and somatic dysfunction of thoracic region: Secondary | ICD-10-CM | POA: Diagnosis not present

## 2021-08-05 DIAGNOSIS — M5441 Lumbago with sciatica, right side: Secondary | ICD-10-CM | POA: Diagnosis not present

## 2021-08-08 DIAGNOSIS — M5441 Lumbago with sciatica, right side: Secondary | ICD-10-CM | POA: Diagnosis not present

## 2021-08-08 DIAGNOSIS — M9905 Segmental and somatic dysfunction of pelvic region: Secondary | ICD-10-CM | POA: Diagnosis not present

## 2021-08-08 DIAGNOSIS — M9903 Segmental and somatic dysfunction of lumbar region: Secondary | ICD-10-CM | POA: Diagnosis not present

## 2021-08-08 DIAGNOSIS — M9902 Segmental and somatic dysfunction of thoracic region: Secondary | ICD-10-CM | POA: Diagnosis not present

## 2021-08-10 DIAGNOSIS — M9903 Segmental and somatic dysfunction of lumbar region: Secondary | ICD-10-CM | POA: Diagnosis not present

## 2021-08-10 DIAGNOSIS — M5441 Lumbago with sciatica, right side: Secondary | ICD-10-CM | POA: Diagnosis not present

## 2021-08-10 DIAGNOSIS — M9905 Segmental and somatic dysfunction of pelvic region: Secondary | ICD-10-CM | POA: Diagnosis not present

## 2021-08-10 DIAGNOSIS — M9902 Segmental and somatic dysfunction of thoracic region: Secondary | ICD-10-CM | POA: Diagnosis not present

## 2021-08-15 DIAGNOSIS — M9903 Segmental and somatic dysfunction of lumbar region: Secondary | ICD-10-CM | POA: Diagnosis not present

## 2021-08-15 DIAGNOSIS — M5441 Lumbago with sciatica, right side: Secondary | ICD-10-CM | POA: Diagnosis not present

## 2021-08-15 DIAGNOSIS — M9902 Segmental and somatic dysfunction of thoracic region: Secondary | ICD-10-CM | POA: Diagnosis not present

## 2021-08-15 DIAGNOSIS — M9905 Segmental and somatic dysfunction of pelvic region: Secondary | ICD-10-CM | POA: Diagnosis not present

## 2021-08-19 ENCOUNTER — Encounter: Payer: Self-pay | Admitting: Orthopedic Surgery

## 2021-08-19 ENCOUNTER — Other Ambulatory Visit: Payer: Self-pay

## 2021-08-19 ENCOUNTER — Ambulatory Visit (INDEPENDENT_AMBULATORY_CARE_PROVIDER_SITE_OTHER): Payer: Medicare Other | Admitting: Orthopedic Surgery

## 2021-08-19 ENCOUNTER — Ambulatory Visit: Payer: Medicare Other

## 2021-08-19 VITALS — BP 173/74 | HR 84 | Ht 68.0 in | Wt 196.0 lb

## 2021-08-19 DIAGNOSIS — M5441 Lumbago with sciatica, right side: Secondary | ICD-10-CM

## 2021-08-19 NOTE — Patient Instructions (Signed)
Please call and schedule your MRI:   Central Scheduling [phone]

## 2021-08-19 NOTE — Progress Notes (Signed)
New Patient Visit  Assessment: David Weiss is a 76 y.o. male with the following: 1. Acute right-sided low back pain with right-sided sciatica  Plan: Patient has pain in the lower back, especially within the right buttock.  The pain radiates distally in the back of his thigh.  Radiographs today demonstrates no evidence of anterolisthesis, but with significant degenerative changes, including anterior bridging osteophytes in his lumbar spine.  He has tried medications.  Prednisone Dosepak did not improve symptoms.  He has done home exercises, as well as some exercises with a chiropractor, without sustained improvement.  As a result, I recommended a lumbar spine MRI to fully evaluate for pressure on the nerve roots.  He is in agreement with this plan.  Pending the results of the MRI, we will consider a referral to neurosurgery, referral for image guided injections or formal physical therapy.  He is in agreement with this plan.   Follow-up: Return for After MRI.  Subjective:  Chief Complaint  Patient presents with   Sciatiaca RT    Pain started the end of Oct 2022    History of Present Illness: David Weiss is a 76 y.o. male who has been referred to clinic today by Gypsy Decant, [place] for evaluation of low back pain.  For the past 2-3 months, he has had pain in the right side of his lower back.  Specifically, the pain is in his right buttock area, and radiates distally.  He notes improvement if he puts pressure directly over the focus of pain.  Pain is radiating in the posterior thigh, to his knee.  No numbness or tingling.  It is very difficult for him to get comfortable, while sitting down.  He tried a course of prednisone, with no improvement in his symptoms.  He is occasionally taking over-the-counter medications at this time.  He has tried working with a Restaurant manager, fast food.  He has done home exercises, as well as specific activities with his chiropractor.  None of this has provided any resolution  of his symptoms.  No prior injuries to his back.   Review of Systems: No fevers or chills No numbness or tingling No chest pain No shortness of breath No bowel or bladder dysfunction No GI distress No headaches   Medical History:  Past Medical History:  Diagnosis Date   Anemia    Barrett's esophagus 5/11   EGD Dr. Phillips Climes Barrett's, multiple antral/bulbar erosins, D2 erosion sec NSAIDs   Diverticulosis 5/11   Colonoscopy Dr Gala Romney   Family hx of colon cancer    GERD (gastroesophageal reflux disease)    Helicobacter pylori gastritis 5/11   HTN (hypertension)    Hx of adenomatous colonic polyps 8/10   Colonoscopy Dr. Gala Romney   Hyperlipemia    Schatzki's ring 5/11   39F dilator    Past Surgical History:  Procedure Laterality Date   BIOPSY N/A 08/30/2016   Procedure: BIOPSY;  Surgeon: Rogene Houston, MD;  [facility];  Service: Endoscopy;  Laterality: N/A;  esophageal   CAPSULE ENDOSCOPY OF THE SMALL BOWEL  02/17/2011       COLONOSCOPY  05/19/2011   Procedure: COLONOSCOPY;  Surgeon: Daneil Dolin, MD;  [facility];  Service: Endoscopy;  Laterality: N/A;  8:30   COLONOSCOPY N/A 08/21/2013   Procedure: COLONOSCOPY;  Surgeon: Rogene Houston, MD;  [facility];  Service: Endoscopy;  Laterality: N/A;  930   COLONOSCOPY N/A 02/26/2019   Procedure: COLONOSCOPY;  Surgeon: Rogene Houston, MD;  [facility];  Service: Endoscopy;  Laterality: N/A;  9:30-rescheduled to 5/20 @ 11:15 per Lelon Frohlich   EAR CYST EXCISION N/A 03/25/2014   Procedure: EXCISION OF NEOPLASM NECK;  Surgeon: Jamesetta So, MD;  Location: [facility];  Service: General;  Laterality: N/A;   ESOPHAGOGASTRODUODENOSCOPY  02/13/2011   Procedure: ESOPHAGOGASTRODUODENOSCOPY (EGD);  Surgeon: Daneil Dolin, MD;  [facility];  Service: Endoscopy;  Laterality: N/A;   ESOPHAGOGASTRODUODENOSCOPY N/A 08/30/2016   Procedure: ESOPHAGOGASTRODUODENOSCOPY (EGD);  Surgeon: Rogene Houston, MD;  [facility];  Service: Endoscopy;  Laterality: N/A;  730   excisional hernia  01/2013   GIVENS CAPSULE STUDY  02/15/2011   Procedure: GIVENS CAPSULE STUDY;  Surgeon: Daneil Dolin, MD;  [facility];  Service: Endoscopy;  Laterality: N/A;   POLYPECTOMY  02/26/2019   Procedure: POLYPECTOMY;  Surgeon: Rogene Houston, MD;  [facility];  Service: Endoscopy;;  colon   small intestine partial removal  06/2011   due to malignant tumor   VASECTOMY      Family History  Problem Relation Age of Onset   Diabetes Mother    Heart failure Mother    Prostate cancer Father 15   Colon cancer Brother 60   Social History   Tobacco Use   Smoking status: Former    Packs/day: 1.50    Years: 30.00    Pack years: 45.00    Types: Cigarettes    Quit date: 03/18/1981    Years since quitting: 40.4   Smokeless tobacco: Never   Tobacco comments:    quit 1982  Vaping Use   Vaping Use: Never used  Substance Use Topics   Alcohol use: Yes    Comment: two drinks a day bourbon   Drug use: No    Allergies  Allergen Reactions   Penicillins Itching    Did it involve swelling of the face/tongue/throat, SOB, or low BP? Unknown Did it involve sudden or severe rash/hives, skin peeling, or any reaction on the inside of your mouth or nose? Unknown Did you need to seek medical attention at a hospital or doctor's office? Unknown When did it last happen?childhood reaction  If all above answers are NO, may proceed with cephalosporin use.    Pantoprazole Sodium Itching and Rash    Current Meds  Medication Sig   amLODipine (NORVASC) 5 MG tablet Take 5 mg by mouth daily.   atorvastatin (LIPITOR) 10 MG tablet Take 10 mg by mouth daily.   benazepril (LOTENSIN) 10 MG tablet Take 10 mg by mouth daily.   Cholecalciferol (D3 VITAMIN PO) Take 5,000 Units by mouth daily.    Methylcellulose, Laxative, (FIBER THERAPY PO) Take 1 tablet by mouth daily.   Multiple Vitamin  (MULTIVITAMIN WITH MINERALS) TABS tablet Take 1 tablet by mouth daily.   Omega-3 Fatty Acids (OMEGA-3 FISH OIL PO) Take 500 mg by mouth daily.    omeprazole (PRILOSEC) 20 MG capsule Take 20 mg by mouth daily.   traMADol (ULTRAM) 50 MG tablet Take 1 tablet (50 mg total) by mouth every 6 (six) hours as needed.   zolpidem (AMBIEN) 10 MG tablet Take 10 mg by mouth at bedtime as needed for sleep.     Objective: BP (!) 173/74    Pulse 84    Ht 5\' 8"  (1.727 m)    Wt 196 lb (88.9 kg)    BMI 29.80 kg/m   Physical  Exam:  General: Alert and oriented. and No acute distress. Gait: Normal gait.  Unable to sit down.  Standing through much of the visit.  He has tenderness to palpation deep within the right buttock.  With sustained palpation, the pain and pressure improves.  Negative straight leg raise.  Strength is 5/5 in the lower extremity.  Mild tenderness to palpation in the right lower spine.  No tenderness to palpation over the midline.  2+ patellar tendon reflexes.  1+ Achilles tendon reflexes.  Sensation is intact throughout the right foot.  IMAGING: I personally ordered and reviewed the following images  X-rays of the lumbar spine were obtained in clinic today.  No evidence of an acute injury.  No anterolisthesis.  Diffuse degenerative changes with anterior bridging osteophytes throughout the lumbar spine.  Well-maintained disc heights.  Slight loss of the natural curvature.  Impression: Standing lumbar x-rays with diffuse degenerative changes, without evidence of anterolisthesis    New Medications:  No orders of the defined types were placed in this encounter.     Mordecai Rasmussen, MD  08/19/2021 10:55 AM

## 2021-08-23 ENCOUNTER — Telehealth (INDEPENDENT_AMBULATORY_CARE_PROVIDER_SITE_OTHER): Payer: Self-pay | Admitting: *Deleted

## 2021-08-23 ENCOUNTER — Encounter (INDEPENDENT_AMBULATORY_CARE_PROVIDER_SITE_OTHER): Payer: Self-pay

## 2021-08-23 ENCOUNTER — Encounter (INDEPENDENT_AMBULATORY_CARE_PROVIDER_SITE_OTHER): Payer: Self-pay | Admitting: *Deleted

## 2021-08-23 NOTE — Telephone Encounter (Signed)
Referring MD/PCP: fagan  Procedure: egd  Reason/Indication:  Barrett's esophagus  Has patient had this procedure before?  Yes, 07/2016  If so, when, by whom and where?    Is there a family history of colon cancer?  no  Who?  What age when diagnosed?    Is patient diabetic? If yes, Type 1 or Type 2   no      Does patient have prosthetic heart valve or mechanical valve?  no  Do you have a pacemaker/defibrillator?  no  Has patient ever had endocarditis/atrial fibrillation? no  Does patient use oxygen? no  Has patient had joint replacement within last 12 months?  no  Is patient constipated or do they take laxatives? no  Does patient have a history of alcohol/drug use?  no  Have you had a stroke/heart attack last 6 mths? no  Do you take medicine for weight loss?  no  For male patients,: have you had a hysterectomy                       are you post menopausal                       do you still have your menstrual cycle   Is patient on blood thinner such as Coumadin, Plavix and/or Aspirin? no  Medications: omeprazole 20 mg daily, benazepril 10 mg daily, amlodipine 5 mg daily, atorvastatin 10 mg daily  Allergies: nkda  Medication Adjustment per Dr Rehman/Dr Jenetta Downer   Procedure date & time: 09/01/21

## 2021-08-25 ENCOUNTER — Other Ambulatory Visit (INDEPENDENT_AMBULATORY_CARE_PROVIDER_SITE_OTHER): Payer: Self-pay

## 2021-08-25 DIAGNOSIS — K2270 Barrett's esophagus without dysplasia: Secondary | ICD-10-CM

## 2021-08-30 ENCOUNTER — Other Ambulatory Visit: Payer: Self-pay

## 2021-08-30 ENCOUNTER — Ambulatory Visit (HOSPITAL_COMMUNITY)
Admission: RE | Admit: 2021-08-30 | Discharge: 2021-08-30 | Disposition: A | Discharge status: Home / Self Care | Payer: Medicare Other | Source: Ambulatory Visit | Attending: Orthopedic Surgery | Admitting: Orthopedic Surgery

## 2021-08-30 DIAGNOSIS — M5441 Lumbago with sciatica, right side: Secondary | ICD-10-CM | POA: Diagnosis not present

## 2021-08-30 DIAGNOSIS — M4726 Other spondylosis with radiculopathy, lumbar region: Secondary | ICD-10-CM | POA: Diagnosis not present

## 2021-09-01 ENCOUNTER — Ambulatory Visit (HOSPITAL_COMMUNITY): Payer: Medicare Other | Admitting: Anesthesiology

## 2021-09-01 ENCOUNTER — Ambulatory Visit (HOSPITAL_COMMUNITY)
Admission: RE | Admit: 2021-09-01 | Discharge: 2021-09-01 | Disposition: A | Discharge status: Home / Self Care | Payer: Medicare Other | Attending: Internal Medicine | Admitting: Internal Medicine

## 2021-09-01 ENCOUNTER — Encounter (HOSPITAL_COMMUNITY): Payer: Self-pay | Admitting: Internal Medicine

## 2021-09-01 ENCOUNTER — Other Ambulatory Visit: Payer: Self-pay

## 2021-09-01 ENCOUNTER — Encounter (HOSPITAL_COMMUNITY)
Admission: RE | Disposition: A | Discharge status: Home / Self Care | Payer: Self-pay | Source: Home / Self Care | Attending: Internal Medicine

## 2021-09-01 DIAGNOSIS — Z79899 Other long term (current) drug therapy: Secondary | ICD-10-CM | POA: Diagnosis not present

## 2021-09-01 DIAGNOSIS — K2270 Barrett's esophagus without dysplasia: Secondary | ICD-10-CM | POA: Insufficient documentation

## 2021-09-01 DIAGNOSIS — Z87891 Personal history of nicotine dependence: Secondary | ICD-10-CM | POA: Diagnosis not present

## 2021-09-01 DIAGNOSIS — Z86010 Personal history of colonic polyps: Secondary | ICD-10-CM | POA: Insufficient documentation

## 2021-09-01 DIAGNOSIS — D649 Anemia, unspecified: Secondary | ICD-10-CM | POA: Diagnosis not present

## 2021-09-01 DIAGNOSIS — I10 Essential (primary) hypertension: Secondary | ICD-10-CM | POA: Insufficient documentation

## 2021-09-01 DIAGNOSIS — K219 Gastro-esophageal reflux disease without esophagitis: Secondary | ICD-10-CM | POA: Insufficient documentation

## 2021-09-01 DIAGNOSIS — K449 Diaphragmatic hernia without obstruction or gangrene: Secondary | ICD-10-CM | POA: Diagnosis not present

## 2021-09-01 HISTORY — PX: BIOPSY: SHX5522

## 2021-09-01 HISTORY — PX: ESOPHAGOGASTRODUODENOSCOPY (EGD) WITH PROPOFOL: SHX5813

## 2021-09-01 SURGERY — ESOPHAGOGASTRODUODENOSCOPY (EGD) WITH PROPOFOL
Anesthesia: General

## 2021-09-01 MED ORDER — LACTATED RINGERS IV SOLN: INTRAVENOUS | Status: DC

## 2021-09-01 MED ORDER — PROPOFOL 500 MG/50ML IV EMUL
INTRAVENOUS | Status: DC | PRN
  Administered 2021-09-01: 150 ug/kg/min via INTRAVENOUS

## 2021-09-01 MED ORDER — LIDOCAINE HCL (CARDIAC) PF 100 MG/5ML IV SOSY
PREFILLED_SYRINGE | INTRAVENOUS | Status: DC | PRN
  Administered 2021-09-01: 50 mg via INTRAVENOUS

## 2021-09-01 MED ORDER — PROPOFOL 10 MG/ML IV BOLUS
INTRAVENOUS | Status: DC | PRN
  Administered 2021-09-01 (×2): 50 mg via INTRAVENOUS
  Administered 2021-09-01: 100 mg via INTRAVENOUS

## 2021-09-01 NOTE — Anesthesia Procedure Notes (Signed)
Date/Time: 09/01/2021 9:44 AM Performed by: Orlie Dakin, CRNA Pre-anesthesia Checklist: Patient identified, Emergency Drugs available, Suction available and Patient being monitored Patient Re-evaluated:Patient Re-evaluated prior to induction Oxygen Delivery Method: Nasal cannula Induction Type: IV induction Placement Confirmation: positive ETCO2

## 2021-09-01 NOTE — Anesthesia Preprocedure Evaluation (Addendum)
Anesthesia Evaluation  Patient identified by MRN, date of birth, ID band Patient awake    Reviewed: Allergy & Precautions, NPO status , Patient's Chart, lab work & pertinent test results  Airway Mallampati: III  TM Distance: >3 FB Neck ROM: Full    Dental  (+) Dental Advisory Given Crowns :   Pulmonary sleep apnea (snoring??) , former smoker,    Pulmonary exam normal breath sounds clear to auscultation       Cardiovascular Exercise Tolerance: Good hypertension, Pt. on medications Normal cardiovascular exam Rhythm:Regular Rate:Normal     Neuro/Psych negative neurological ROS  negative psych ROS   GI/Hepatic Neg liver ROS, GERD  Medicated and Controlled,  Endo/Other  negative endocrine ROS  Renal/GU negative Renal ROS  negative genitourinary   Musculoskeletal negative musculoskeletal ROS (+)   Abdominal   Peds negative pediatric ROS (+)  Hematology  (+) Blood dyscrasia, anemia ,   Anesthesia Other Findings   Reproductive/Obstetrics negative OB ROS                           Anesthesia Physical Anesthesia Plan  ASA: 2  Anesthesia Plan: General   Post-op Pain Management: Minimal or no pain anticipated   Induction: Intravenous  PONV Risk Score and Plan: TIVA  Airway Management Planned: Nasal Cannula and Natural Airway  Additional Equipment:   Intra-op Plan:   Post-operative Plan:   Informed Consent: I have reviewed the patients History and Physical, chart, labs and discussed the procedure including the risks, benefits and alternatives for the proposed anesthesia with the patient or authorized representative who has indicated his/her understanding and acceptance.     Dental advisory given  Plan Discussed with: CRNA and Surgeon  Anesthesia Plan Comments:         Anesthesia Quick Evaluation

## 2021-09-01 NOTE — Op Note (Signed)
[facility] Patient Name: David Weiss Procedure Date: 09/01/2021 9:17 AM MRN: [id number] Date of Birth: 1945-08-14 Attending MD: Hildred Laser , MD CSN: [id number] Age: 76 Admit Type: Outpatient Procedure:                Upper GI endoscopy Indications:              Barrett's esophagus, Follow-up of Barrett's                            esophagus Providers:                Hildred Laser, MD, Caprice Kluver, Kristine L. Risa Grill, Technician Referring MD:             Asencion Noble, MD Medicines:                Propofol per Anesthesia Complications:            No immediate complications. Estimated Blood Loss:     Estimated blood loss was minimal. Procedure:                Pre-Anesthesia Assessment:                           - Prior to the procedure, a History and Physical                            was performed, and patient medications and                            allergies were reviewed. The patient's tolerance of                            previous anesthesia was also reviewed. The risks                            and benefits of the procedure and the sedation                            options and risks were discussed with the patient.                            All questions were answered, and informed consent                            was obtained. Prior Anticoagulants: The patient has                            taken no previous anticoagulant or antiplatelet                            agents. ASA Grade Assessment: II - A patient with  mild systemic disease. After reviewing the risks                            and benefits, the patient was deemed in                            satisfactory condition to undergo the procedure.                           After obtaining informed consent, the endoscope was                            passed under direct vision. Throughout the                            procedure, the patient's blood  pressure, pulse, and                            oxygen saturations were monitored continuously. The                            [id number]-H190 ([id number]) scope was introduced through the                            mouth, and advanced to the second part of duodenum.                            The upper GI endoscopy was accomplished without                            difficulty. The patient tolerated the procedure                            well. Scope In: 9:42:44 AM Scope Out: 9:53:16 AM Total Procedure Duration: 0 hours 10 minutes 32 seconds  Findings:      The hypopharynx was normal.      The proximal esophagus and mid esophagus were normal.      There were esophageal mucosal changes secondary to established       short-segment Barrett's disease present in the distal esophagus. The       maximum longitudinal extent of these mucosal changes was 1 cm in length.       Mucosa was biopsied with a cold forceps for histology randomly at 37 cm       from the incisors. One specimen bottle was sent to pathology.      The Z-line was regular and was found 38 cm from the incisors.      A 2 cm hiatal hernia was present.      The entire examined stomach was normal.      The duodenal bulb and second portion of the duodenum were normal. Impression:               - Normal hypopharynx.                           - Normal proximal esophagus and  mid esophagus.                           - Esophageal mucosal changes secondary to                            established short-segment Barrett's disease.                            Biopsied.                           - Z-line regular, 38 cm from the incisors.                           - 2 cm hiatal hernia.                           - Normal stomach.                           - Normal duodenal bulb and second portion of the                            duodenum. Moderate Sedation:      Per Anesthesia Care Recommendation:           - Patient has a contact number available  for                            emergencies. The signs and symptoms of potential                            delayed complications were discussed with the                            patient. Return to normal activities tomorrow.                            Written discharge instructions were provided to the                            patient.                           - Resume previous diet today.                           - Continue present medications.                           - No aspirin, ibuprofen, naproxen, or other                            non-steroidal anti-inflammatory drugs for 1 day.                           - Await pathology results.                           -  Repeat upper endoscopy in 5 years for                            surveillance. Procedure Code(s):        --- Professional ---                           [phone], Esophagogastroduodenoscopy, flexible,                            transoral; with biopsy, single or multiple Diagnosis Code(s):        --- Professional ---                           K22.70, Barrett's esophagus without dysplasia                           K44.9, Diaphragmatic hernia without obstruction or                            gangrene CPT copyright 2019 American [employer]. All rights reserved. The codes documented in this report are preliminary and upon coder review may  be revised to meet current compliance requirements. Hildred Laser, MD Hildred Laser, MD 09/01/2021 10:03:15 AM This report has been signed electronically. Number of Addenda: 0

## 2021-09-01 NOTE — Discharge Instructions (Signed)
No aspirin or NSAIDs for 24 hours Resume usual medications and diet as before. No driving for 24 hours. Physician will call with biopsy results. 

## 2021-09-01 NOTE — Anesthesia Postprocedure Evaluation (Signed)
Anesthesia Post Note  Patient: David Weiss  Procedure(s) Performed: ESOPHAGOGASTRODUODENOSCOPY (EGD) WITH PROPOFOL BIOPSY  Patient location during evaluation: Endoscopy Anesthesia Type: General Level of consciousness: awake and alert and oriented Pain management: pain level controlled Vital Signs Assessment: post-procedure vital signs reviewed and stable Respiratory status: spontaneous breathing, nonlabored ventilation and respiratory function stable Cardiovascular status: blood pressure returned to baseline and stable Postop Assessment: no apparent nausea or vomiting Anesthetic complications: no   No notable events documented.   Last Vitals:  Vitals:   09/01/21 0957 09/01/21 1000  BP: (!) 106/54 135/61  Pulse: 73 70  Resp: (!) 22 20  Temp: 36.6 C   SpO2: 92% 96%    Last Pain:  Vitals:   09/01/21 1000  TempSrc:   PainSc: 0-No pain                 Anaston Koehn C Peytyn Trine

## 2021-09-01 NOTE — H&P (Signed)
David Weiss is an 76 y.o. male.   Chief Complaint: Patient is here for esophagogastroduodenoscopy HPI: Patient is 76 year old Caucasian male who has a history of short segment Barrett's esophagus was here for surveillance EGD.  Last exam was 5 years ago.  He remains on low-dose omeprazole.  He states is working very well.  Only time he has difficulty swallowing is a [place] rice.  He does not have any difficulty with meats or bread.  He denies abdominal pain melena or weight loss. He does not take aspirin or NSAIDs.  Past Medical History:  Diagnosis Date   Anemia    Barrett's esophagus 5/11   EGD Dr. Phillips Climes Barrett's, multiple antral/bulbar erosins, D2 erosion sec NSAIDs   Diverticulosis 5/11   Colonoscopy Dr Gala Romney   Family hx of colon cancer    GERD (gastroesophageal reflux disease)    Helicobacter pylori gastritis 5/11   HTN (hypertension)    Hx of adenomatous colonic polyps 8/10   Colonoscopy Dr. Gala Romney   Hyperlipemia    Schatzki's ring 5/11   72F dilator    Past Surgical History:  Procedure Laterality Date   BIOPSY N/A 08/30/2016   Procedure: BIOPSY;  Surgeon: Rogene Houston, MD;  Location: [facility];  Service: Endoscopy;  Laterality: N/A;  esophageal   CAPSULE ENDOSCOPY OF THE SMALL BOWEL  02/17/2011       COLONOSCOPY  05/19/2011   Procedure: COLONOSCOPY;  Surgeon: Daneil Dolin, MD;  Location: [facility];  Service: Endoscopy;  Laterality: N/A;  8:30   COLONOSCOPY N/A 08/21/2013   Procedure: COLONOSCOPY;  Surgeon: Rogene Houston, MD;  Location: [facility];  Service: Endoscopy;  Laterality: N/A;  930   COLONOSCOPY N/A 02/26/2019   Procedure: COLONOSCOPY;  Surgeon: Rogene Houston, MD;  Location: [facility];  Service: Endoscopy;  Laterality: N/A;  9:30-rescheduled to 5/20 @ 11:15 per Lelon Frohlich   EAR CYST EXCISION N/A 03/25/2014   Procedure: EXCISION OF NEOPLASM NECK;  Surgeon: Jamesetta So, MD;  Location: [facility];  Service: General;  Laterality: N/A;    ESOPHAGOGASTRODUODENOSCOPY  02/13/2011   Procedure: ESOPHAGOGASTRODUODENOSCOPY (EGD);  Surgeon: Daneil Dolin, MD;  Location: [facility];  Service: Endoscopy;  Laterality: N/A;   ESOPHAGOGASTRODUODENOSCOPY N/A 08/30/2016   Procedure: ESOPHAGOGASTRODUODENOSCOPY (EGD);  Surgeon: Rogene Houston, MD;  Location: [facility];  Service: Endoscopy;  Laterality: N/A;  730   excisional hernia  01/2013   GIVENS CAPSULE STUDY  02/15/2011   Procedure: GIVENS CAPSULE STUDY;  Surgeon: Daneil Dolin, MD;  Location: [facility];  Service: Endoscopy;  Laterality: N/A;   POLYPECTOMY  02/26/2019   Procedure: POLYPECTOMY;  Surgeon: Rogene Houston, MD;  Location: [facility];  Service: Endoscopy;;  colon   small intestine partial removal  06/2011   due to malignant tumor   VASECTOMY      Family History  Problem Relation Age of Onset   Diabetes Mother    Heart failure Mother    Prostate cancer Father 12   Colon cancer Brother 52   Social History:  reports that he quit smoking about 40 years ago. His smoking use included cigarettes. He has a 45.00 pack-year smoking history. He has never used smokeless tobacco. He reports current alcohol use. He reports that he does not use drugs.  Allergies:  Allergies  Allergen Reactions   Penicillins Itching    Did it involve swelling of the face/tongue/throat, SOB, or low BP? Unknown Did  it involve sudden or severe rash/hives, skin peeling, or any reaction on the inside of your mouth or nose? Unknown Did you need to seek medical attention at a hospital or doctor's office? Unknown When did it last happen?childhood reaction  If all above answers are NO, may proceed with cephalosporin use.    Pantoprazole Sodium Itching and Rash    Medications Prior to Admission  Medication Sig Dispense Refill   acetaminophen (TYLENOL) 500 MG tablet Take 500 mg by mouth every 6 (six) hours as needed for moderate pain or headache.     amLODipine (NORVASC) 5 MG tablet Take 5  mg by mouth daily.     atorvastatin (LIPITOR) 10 MG tablet Take 10 mg by mouth daily.     benazepril (LOTENSIN) 10 MG tablet Take 10 mg by mouth daily.     Emollient (LUBRIDERM EX) Apply 1 application topically daily as needed (dry skin/ eczema).     omeprazole (PRILOSEC) 20 MG capsule Take 20 mg by mouth daily.     Skin Protectants, Misc. (EUCERIN) cream Apply 1 application topically as needed for dry skin (eczema).     zolpidem (AMBIEN) 10 MG tablet Take 10 mg by mouth at bedtime as needed for sleep.       No results found for this or any previous visit (from the past 48 hour(s)). MR Lumbar Spine Wo Contrast  Result Date: 08/31/2021 CLINICAL DATA:  Lower back pain radiating down the right leg for 6 weeks EXAM: MRI LUMBAR SPINE WITHOUT CONTRAST TECHNIQUE: Multiplanar, multisequence MR imaging of the lumbar spine was performed. No intravenous contrast was administered. COMPARISON:  None. FINDINGS: Segmentation:  5 lumbar type vertebrae Alignment:  Mild anterolisthesis at L5-S1 Vertebrae: No fracture, evidence of discitis, or aggressive bone lesion. Hemangioma at the level numbered L2 Conus medullaris and cauda equina: Conus extends to the L1 level. Conus and cauda equina appear normal. Paraspinal and other soft tissues: Left renal hilar cysts. Disc levels: T12- L1: Ventral spondylosis. L1-L2: Ventral spondylosis. L2-L3: Ventral spondylitic spurring. L3-L4: Ventral spondylitic spurring.  Mild facet spurring L4-L5: Moderate to bulky facet spurring. Ventral spondylosis and mild disc bulging L5-S1:Facet spurring with facet ankylosis. Spondylitic spurring especially left far-lateral. IMPRESSION: 1. Generalized spondylosis and lower lumbar facet osteoarthritis with L5-S1 facet ankylosis. 2. Widely patent canal and foramina. Electronically Signed   By: Jorje Guild M.D.   On: 08/31/2021 10:51    Review of Systems  Blood pressure 140/63, pulse 73, temperature 97.8 F (36.6 C), temperature source Oral,  resp. rate 18, height 5\' 8"  (1.727 m), weight 88.9 kg, SpO2 95 %. Physical Exam HENT:     Mouth/Throat:     Mouth: Mucous membranes are moist.     Pharynx: Oropharynx is clear.  Eyes:     General: No scleral icterus.    Conjunctiva/sclera: Conjunctivae normal.  Cardiovascular:     Rate and Rhythm: Normal rate and regular rhythm.     Heart sounds: Normal heart sounds. No murmur heard. Pulmonary:     Effort: Pulmonary effort is normal.     Breath sounds: Normal breath sounds.  Abdominal:     General: There is no distension.     Palpations: Abdomen is soft. There is no mass.     Tenderness: There is no abdominal tenderness.  Musculoskeletal:        General: No swelling.     Cervical back: Neck supple.  Lymphadenopathy:     Cervical: No cervical adenopathy.  Skin:  General: Skin is warm and dry.  Neurological:     Mental Status: He is alert.     Assessment/Plan  Short segment Barrett's esophagus Surveillance esophagogastroduodenoscopy.  Hildred Laser, MD 09/01/2021, 9:34 AM

## 2021-09-01 NOTE — Transfer of Care (Signed)
Immediate Anesthesia Transfer of Care Note  Patient: David Weiss  Procedure(s) Performed: ESOPHAGOGASTRODUODENOSCOPY (EGD) WITH PROPOFOL BIOPSY  Patient Location: Endoscopy Unit  Anesthesia Type:General  Level of Consciousness: awake  Airway & Oxygen Therapy: Patient Spontanous Breathing  Post-op Assessment: Report given to RN and Post -op Vital signs reviewed and stable  Post vital signs: Reviewed and stable  Last Vitals:  Vitals Value Taken Time  BP 106/54 09/01/21 0957  Temp 36.6 C 09/01/21 0957  Pulse 73 09/01/21 0957  Resp 22 09/01/21 0957  SpO2 92 % 09/01/21 0957    Last Pain:  Vitals:   09/01/21 0957  TempSrc: Axillary  PainSc: 0-No pain      Patients Stated Pain Goal: 6 (91/[id number])  Complications: No notable events documented.

## 2021-09-02 LAB — SURGICAL PATHOLOGY

## 2021-09-05 ENCOUNTER — Encounter (HOSPITAL_COMMUNITY): Payer: Self-pay | Admitting: Internal Medicine

## 2021-09-06 ENCOUNTER — Other Ambulatory Visit: Payer: Self-pay

## 2021-09-06 ENCOUNTER — Ambulatory Visit (INDEPENDENT_AMBULATORY_CARE_PROVIDER_SITE_OTHER): Payer: Medicare Other | Admitting: Orthopedic Surgery

## 2021-09-06 ENCOUNTER — Encounter: Payer: Self-pay | Admitting: Orthopedic Surgery

## 2021-09-06 VITALS — Ht 68.0 in | Wt 196.0 lb

## 2021-09-06 DIAGNOSIS — M5441 Lumbago with sciatica, right side: Secondary | ICD-10-CM

## 2021-09-06 NOTE — Patient Instructions (Signed)
Placed an urgent referral for image guided spinal injection.  Please pay attention to changes in symptoms.  IF you need anything else, please contact the clinic.

## 2021-09-06 NOTE — Progress Notes (Signed)
New Patient Visit  Assessment: David Weiss is a 76 y.o. male with the following: 1. Acute right-sided low back pain with right-sided sciatica  Plan: Patient continues to have lower back pain.  He has sharp pain in the right buttock.  It is difficult for him to sit or stand for an extended period of time.  MRI demonstrates spondylosis, without obvious nerve compression.  We discussed multiple treatment options, and he would like to proceed with an image guided steroid injection.  We will place referral urgently, and have the schedule.  If this appointment has not been scheduled within the next couple of days, have asked him to contact clinic.  He would like to proceed with this injection as soon as possible, as his wife is set to have surgery later this month.  Follow-up: Return if symptoms worsen or fail to improve.  Subjective:  Chief Complaint  Patient presents with   Back Pain    LBP w sciatica     History of Present Illness: David Weiss is a 76 y.o. male who returns to clinic for repeat evaluation of his lower back.  He has obtained an MRI.  He continues to have pain in the lower back, and within the right buttock.  He states that it is very painful for him to sit, due to the pain in his right buttock.  He also notes pain that progressively worsens upon standing.  This happens almost immediately.  No numbness or tingling in his right foot.  He has tried prednisone without improvement in his symptoms.  He has completed home exercises, without improvements in his symptoms.  He states that the pain is severe, and is not getting better.  Review of Systems: No fevers or chills No numbness or tingling No chest pain No shortness of breath No bowel or bladder dysfunction No GI distress No headaches   Objective: Ht 5\' 8"  (1.727 m)    Wt 196 lb (88.9 kg)    BMI 29.80 kg/m   Physical Exam:  General: Alert and oriented. and No acute distress. Gait: Normal gait.  Mild tenderness  to palpation of the lower back.  Negative straight leg raise on the right.  Tenderness to palpation within the right buttock.  He has difficulty sitting down.  He states the pain worsens when he stands for more than a minute.  2+ patellar tendon reflex.  Sensation intact throughout the right foot.  IMAGING: I personally ordered and reviewed the following images  Lumbar spine MRI  Impression:  1. Generalized spondylosis and lower lumbar facet osteoarthritis with L5-S1 facet ankylosis. 2. Widely patent canal and foramina.   New Medications:  No orders of the defined types were placed in this encounter.     Mordecai Rasmussen, MD  09/06/2021 8:56 AM

## 2021-09-07 ENCOUNTER — Telehealth: Payer: Self-pay | Admitting: Physical Medicine and Rehabilitation

## 2021-09-07 NOTE — Telephone Encounter (Signed)
Patient called. He would like to schedule with Dr. Ernestina Patches. His call back number is [phone]

## 2021-09-13 ENCOUNTER — Encounter: Payer: Self-pay | Admitting: Physical Medicine and Rehabilitation

## 2021-09-13 ENCOUNTER — Ambulatory Visit (INDEPENDENT_AMBULATORY_CARE_PROVIDER_SITE_OTHER): Payer: Medicare Other | Admitting: Physical Medicine and Rehabilitation

## 2021-09-13 ENCOUNTER — Other Ambulatory Visit: Payer: Self-pay

## 2021-09-13 ENCOUNTER — Ambulatory Visit: Payer: Self-pay

## 2021-09-13 VITALS — BP 174/65 | HR 74

## 2021-09-13 DIAGNOSIS — M5416 Radiculopathy, lumbar region: Secondary | ICD-10-CM | POA: Diagnosis not present

## 2021-09-13 MED ORDER — METHYLPREDNISOLONE ACETATE 80 MG/ML IJ SUSP
80.0000 mg | Freq: Once | INTRAMUSCULAR | Status: AC
  Administered 2021-09-13: 80 mg

## 2021-09-13 NOTE — Patient Instructions (Signed)

## 2021-09-13 NOTE — Progress Notes (Signed)
Pt state lower back pain that travels to his right buttock and down his right leg and foot. Pt state he takes pain meds to help ease his pain.  Numeric Pain Rating Scale and Functional Assessment Average Pain 3   In the last MONTH (on 0-10 scale) has pain interfered with the following?  1. General activity like being  able to carry out your everyday physical activities such as walking, climbing stairs, carrying groceries, or moving a chair?  Rating(10)   +Driver, -BT, -Dye Allergies.

## 2021-09-16 ENCOUNTER — Encounter (INDEPENDENT_AMBULATORY_CARE_PROVIDER_SITE_OTHER): Payer: Self-pay

## 2021-09-24 DIAGNOSIS — Z20822 Contact with and (suspected) exposure to covid-19: Secondary | ICD-10-CM | POA: Diagnosis not present

## 2021-09-25 NOTE — Progress Notes (Signed)
David Weiss - 76 y.o. male MRN [id number]  Date of birth: 1946/05/06  Office Visit Note: Visit Date: 09/13/2021 PCP: Asencion Noble, MD Referred by: Asencion Noble, MD  Subjective: Chief Complaint  Patient presents with   Lower Back - Pain   Right Leg - Pain   Right Foot - Pain   HPI:  David Weiss is a 76 y.o. male who comes in today at the request of Dr. Larena Glassman for planned Right L4-5 Lumbar Transforaminal epidural steroid injection with fluoroscopic guidance.  The patient has failed conservative care including home exercise, medications, time and activity modification.  This injection will be diagnostic and hopefully therapeutic.  Please see requesting physician notes for further details and justification.  ROS Otherwise per HPI.  Assessment & Plan: Visit Diagnoses:    ICD-10-CM   1. Lumbar radiculopathy  M54.16 XR C-ARM NO REPORT    Epidural Steroid injection    methylPREDNISolone acetate (DEPO-MEDROL) injection 80 mg      Plan: No additional findings.   Meds & Orders:  Meds ordered this encounter  Medications   methylPREDNISolone acetate (DEPO-MEDROL) injection 80 mg    Orders Placed This Encounter  Procedures   XR C-ARM NO REPORT   Epidural Steroid injection    Follow-up: Return if symptoms worsen or fail to improve.   Procedures: No procedures performed  Lumbosacral Transforaminal Epidural Steroid Injection - Sub-Pedicular Approach with Fluoroscopic Guidance  Patient: David Weiss      Date of Birth: 04/01/1946 MRN: [id number] PCP: Asencion Noble, MD      Visit Date: 09/13/2021   Universal Protocol:    Date/Time: 09/13/2021  Consent Given By: the patient  Position: PRONE  Additional Comments: Vital signs were monitored before and after the procedure. Patient was prepped and draped in the usual sterile fashion. The correct patient, procedure, and site was verified.   Injection Procedure Details:   Procedure diagnoses: Lumbar radiculopathy [M54.16]     Meds Administered:  Meds ordered this encounter  Medications   methylPREDNISolone acetate (DEPO-MEDROL) injection 80 mg    Laterality: Right  Location/Site: L4  Needle:5.0 in., 22 ga.  Short bevel or Quincke spinal needle  Needle Placement: Transforaminal  Findings:    -Comments: Excellent flow of contrast along the nerve, nerve root and into the epidural space.  Procedure Details: After squaring off the end-plates to get a true AP view, the C-arm was positioned so that an oblique view of the foramen as noted above was visualized. The target area is just inferior to the "nose of the scotty dog" or sub pedicular. The soft tissues overlying this structure were infiltrated with 2-3 ml. of 1% Lidocaine without Epinephrine.  The spinal needle was inserted toward the target using a "trajectory" view along the fluoroscope beam.  Under AP and lateral visualization, the needle was advanced so it did not puncture dura and was located close the 6 O'Clock position of the pedical in AP tracterory. Biplanar projections were used to confirm position. Aspiration was confirmed to be negative for CSF and/or blood. A 1-2 ml. volume of Isovue-250 was injected and flow of contrast was noted at each level. Radiographs were obtained for documentation purposes.   After attaining the desired flow of contrast documented above, a 0.5 to 1.0 ml test dose of 0.25% Marcaine was injected into each respective transforaminal space.  The patient was observed for 90 seconds post injection.  After no sensory deficits were reported, and normal lower extremity motor  function was noted,   the above injectate was administered so that equal amounts of the injectate were placed at each foramen (level) into the transforaminal epidural space.   Additional Comments:  The patient tolerated the procedure well Dressing: 2 x 2 sterile gauze and Band-Aid    Post-procedure details: Patient was observed during the  procedure. Post-procedure instructions were reviewed.  Patient left the clinic in stable condition.    Clinical History: MRI LUMBAR SPINE WITHOUT CONTRAST   TECHNIQUE: Multiplanar, multisequence MR imaging of the lumbar spine was performed. No intravenous contrast was administered.   COMPARISON:  None.   FINDINGS: Segmentation:  5 lumbar type vertebrae   Alignment:  Mild anterolisthesis at L5-S1   Vertebrae: No fracture, evidence of discitis, or aggressive bone lesion. Hemangioma at the level numbered L2   Conus medullaris and cauda equina: Conus extends to the L1 level. Conus and cauda equina appear normal.   Paraspinal and other soft tissues: Left renal hilar cysts.   Disc levels:   T12- L1: Ventral spondylosis.   L1-L2: Ventral spondylosis.   L2-L3: Ventral spondylitic spurring.   L3-L4: Ventral spondylitic spurring.  Mild facet spurring   L4-L5: Moderate to bulky facet spurring. Ventral spondylosis and mild disc bulging   L5-S1:Facet spurring with facet ankylosis. Spondylitic spurring especially left far-lateral.   IMPRESSION: 1. Generalized spondylosis and lower lumbar facet osteoarthritis with L5-S1 facet ankylosis. 2. Widely patent canal and foramina.     Electronically Signed   By: Jorje Guild M.D.   On: 08/31/2021 10:51     Objective:  VS:  HT:     WT:    BMI:      BP:(!) 174/65   HR:74bpm   TEMP: ( )   RESP:  Physical Exam Vitals and nursing note reviewed.  Constitutional:      General: He is not in acute distress.    Appearance: Normal appearance. He is not ill-appearing.  HENT:     Head: Normocephalic and atraumatic.     Right Ear: External ear normal.     Left Ear: External ear normal.     Nose: No congestion.  Eyes:     Extraocular Movements: Extraocular movements intact.  Cardiovascular:     Rate and Rhythm: Normal rate.     Pulses: Normal pulses.  Pulmonary:     Effort: Pulmonary effort is normal. No respiratory distress.   Abdominal:     General: There is no distension.     Palpations: Abdomen is soft.  Musculoskeletal:        General: No tenderness or signs of injury.     Cervical back: Neck supple.     Right lower leg: No edema.     Left lower leg: No edema.     Comments: Patient has good distal strength without clonus.  Skin:    Findings: No erythema or rash.  Neurological:     General: No focal deficit present.     Mental Status: He is alert and oriented to person, place, and time.     Sensory: No sensory deficit.     Motor: No weakness or abnormal muscle tone.     Coordination: Coordination normal.  Psychiatric:        Mood and Affect: Mood normal.        Behavior: Behavior normal.     Imaging: No results found.

## 2021-09-25 NOTE — Procedures (Signed)
Lumbosacral Transforaminal Epidural Steroid Injection - Sub-Pedicular Approach with Fluoroscopic Guidance  Patient: David Weiss      Date of Birth: Feb 21, 1946 MRN: [id number] PCP: Asencion Noble, MD      Visit Date: 09/13/2021   Universal Protocol:    Date/Time: 09/13/2021  Consent Given By: the patient  Position: PRONE  Additional Comments: Vital signs were monitored before and after the procedure. Patient was prepped and draped in the usual sterile fashion. The correct patient, procedure, and site was verified.   Injection Procedure Details:   Procedure diagnoses: Lumbar radiculopathy [M54.16]    Meds Administered:  Meds ordered this encounter  Medications   methylPREDNISolone acetate (DEPO-MEDROL) injection 80 mg    Laterality: Right  Location/Site: L4  Needle:5.0 in., 22 ga.  Short bevel or Quincke spinal needle  Needle Placement: Transforaminal  Findings:    -Comments: Excellent flow of contrast along the nerve, nerve root and into the epidural space.  Procedure Details: After squaring off the end-plates to get a true AP view, the C-arm was positioned so that an oblique view of the foramen as noted above was visualized. The target area is just inferior to the "nose of the scotty dog" or sub pedicular. The soft tissues overlying this structure were infiltrated with 2-3 ml. of 1% Lidocaine without Epinephrine.  The spinal needle was inserted toward the target using a "trajectory" view along the fluoroscope beam.  Under AP and lateral visualization, the needle was advanced so it did not puncture dura and was located close the 6 O'Clock position of the pedical in AP tracterory. Biplanar projections were used to confirm position. Aspiration was confirmed to be negative for CSF and/or blood. A 1-2 ml. volume of Isovue-250 was injected and flow of contrast was noted at each level. Radiographs were obtained for documentation purposes.   After attaining the desired flow of  contrast documented above, a 0.5 to 1.0 ml test dose of 0.25% Marcaine was injected into each respective transforaminal space.  The patient was observed for 90 seconds post injection.  After no sensory deficits were reported, and normal lower extremity motor function was noted,   the above injectate was administered so that equal amounts of the injectate were placed at each foramen (level) into the transforaminal epidural space.   Additional Comments:  The patient tolerated the procedure well Dressing: 2 x 2 sterile gauze and Band-Aid    Post-procedure details: Patient was observed during the procedure. Post-procedure instructions were reviewed.  Patient left the clinic in stable condition.

## 2021-09-27 ENCOUNTER — Other Ambulatory Visit: Payer: Self-pay

## 2021-09-27 ENCOUNTER — Encounter: Payer: Self-pay | Admitting: Physical Medicine and Rehabilitation

## 2021-09-27 ENCOUNTER — Ambulatory Visit (INDEPENDENT_AMBULATORY_CARE_PROVIDER_SITE_OTHER): Payer: Medicare Other | Admitting: Physical Medicine and Rehabilitation

## 2021-09-27 ENCOUNTER — Ambulatory Visit: Payer: Self-pay

## 2021-09-27 VITALS — BP 162/92 | HR 73

## 2021-09-27 DIAGNOSIS — G8929 Other chronic pain: Secondary | ICD-10-CM | POA: Diagnosis not present

## 2021-09-27 DIAGNOSIS — M7071 Other bursitis of hip, right hip: Secondary | ICD-10-CM | POA: Diagnosis not present

## 2021-09-27 DIAGNOSIS — M5441 Lumbago with sciatica, right side: Secondary | ICD-10-CM | POA: Diagnosis not present

## 2021-09-27 DIAGNOSIS — M47816 Spondylosis without myelopathy or radiculopathy, lumbar region: Secondary | ICD-10-CM

## 2021-09-27 NOTE — Progress Notes (Signed)
Pt state lower back pain that travels to his right buttocks and hip. Pt state sitting and sitting in a car makes the pain worse. Pt state he takes pain meds to help ease his pain.  Numeric Pain Rating Scale and Functional Assessment Average Pain 10 Pain Right Now 3 My pain is intermittent, dull, and aching Pain is worse with: sitting and some activites Pain improves with: medication   In the last MONTH (on 0-10 scale) has pain interfered with the following?  1. General activity like being  able to carry out your everyday physical activities such as walking, climbing stairs, carrying groceries, or moving a chair?  Rating(7)  2. Relation with others like being able to carry out your usual social activities and roles such as  activities at home, at work and in your community. Rating(8)  3. Enjoyment of life such that you have  been bothered by emotional problems such as feeling anxious, depressed or irritable?  Rating(9)

## 2021-09-27 NOTE — Progress Notes (Signed)
David Weiss - 76 y.o. male MRN [id number]  Date of birth: [birth year]  Office Visit Note: Visit Date: 09/27/2021 PCP: Asencion Noble, MD Referred by: Mordecai Rasmussen, MD  Subjective: Chief Complaint  Patient presents with   Lower Back - Pain   Right Hip - Pain   HPI: David Weiss is a 76 y.o. male who comes in today for evaluation and management of continued low back and right buttock pain.  The symptoms have been chronic and severe now for some time.  He was originally seen by Dr. Larena Glassman in [place].  Epidural injection had been requested diagnostically which we did perform.  We performed a right L4 transforaminal epidural steroid injection.  Patient reports he thought he got about 20% relief which is still in the area of potentially placebo effect.  He really has not gotten much relief of the right hip and buttock pain.  He denies any groin pain any pain with movement.  His biggest complaint is pain with sitting for any extended period particular in the car and he will have to change positions.  He feels like it is the actual pressure of sitting that hurts right at the intersection of the proximal hamstring muscle and buttock area.  He can stand and walk without difficulty.  He does get some back pain with standing.  His back pain really is not as severe as this buttock pain.  He has no paresthesias or numbness or tingling or pain down the leg past the knee.  No left-sided complaints.  No history of prior lumbar surgery.  MRI of the lumbar spine reviewed with him today with spine models and imaging.  This shows a well maintained canal with no stenosis.  He does have significant arthritis of the right L4-5 and L5-S1 facet joints.  X-rays reviewed did not show any hip arthritis per se although may be some minimal arthritic changes and no changes of the sacroiliac joints.  Interestingly, the patient was having significant right foot pain with swelling of the ankle the last time we saw him.  He does  have a history of gout.  He reports that the prednisone he was given before as well as the shot seem to have eliminated the gout from his ankle.     Review of Systems  Musculoskeletal:  Positive for back pain and joint pain.  All other systems reviewed and are negative. Otherwise per HPI.  Assessment & Plan: Visit Diagnoses:    ICD-10-CM   1. Ischial bursitis of right side  M70.71 XR C-ARM NO REPORT    right Ischial Bursa    2. Spondylosis without myelopathy or radiculopathy, lumbar region  M47.816     3. Chronic right-sided low back pain with right-sided sciatica  M54.41    G89.29        Plan: Findings:  1.  Chronic worsening right more than left low back pain with prior symptoms of hip pain and foot pain.  I do not feel from the MRI evidence and his clinical course that the back and spine is related to the hip and foot.  The foot and ankle was probably a flareup of his gout.  He does not take any allopurinol.  He should follow-up with his primary physician for this.  His hip pain as noted below is still there in the right buttock.  His back pain is present but not really something that is significant for him.  I do feel like  his back pain is facet joint mediated as he has severe facet arthritis particular on the right at L4-5 and L5-S1.  Consider medial branch blocks in the future if needed.  2.  Right buttock pain that seems to be most consistent with ischial bursitis or ischial pain syndrome.  He has pain with sitting for any prolonged period of time and has to move to get comfortable.  No pain with standing and walking.  No groin pain with movement.  Mild degenerative change of the hips on x-ray.  Completed diagnostic ischial injections today.  Patient will let us know how that does.  Continue follow-up with Dr. Amedeo Kinsman.   Meds & Orders: No orders of the defined types were placed in this encounter.   Orders Placed This Encounter  Procedures   right Ischial Bursa   XR C-ARM NO  REPORT    Follow-up: Return if symptoms worsen or fail to improve.   Procedures: right Ischial Bursa on 09/27/2021 8:43 AM Indications: pain and diagnostic evaluation Details: 22 G 3.5 in needle, fluoroscopy-guided posterior approach  Arthrogram: No  Medications: 4 mL bupivacaine 0.25 %; 40 mg triamcinolone acetonide 40 MG/ML Outcome: tolerated well, no immediate complications  There was excellent flow of contrast outlining the ischial bursa.  There was no vascular flow noted. Procedure, treatment alternatives, risks and benefits explained, specific risks discussed. Consent was given by the patient. Immediately prior to procedure a time out was called to verify the correct patient, procedure, equipment, support staff and site/side marked as required. Patient was prepped and draped in the usual sterile fashion.         Clinical History: MRI LUMBAR SPINE WITHOUT CONTRAST   TECHNIQUE: Multiplanar, multisequence MR imaging of the lumbar spine was performed. No intravenous contrast was administered.   COMPARISON:  None.   FINDINGS: Segmentation:  5 lumbar type vertebrae   Alignment:  Mild anterolisthesis at L5-S1   Vertebrae: No fracture, evidence of discitis, or aggressive bone lesion. Hemangioma at the level numbered L2   Conus medullaris and cauda equina: Conus extends to the L1 level. Conus and cauda equina appear normal.   Paraspinal and other soft tissues: Left renal hilar cysts.   Disc levels:   T12- L1: Ventral spondylosis.   L1-L2: Ventral spondylosis.   L2-L3: Ventral spondylitic spurring.   L3-L4: Ventral spondylitic spurring.  Mild facet spurring   L4-L5: Moderate to bulky facet spurring. Ventral spondylosis and mild disc bulging   L5-S1:Facet spurring with facet ankylosis. Spondylitic spurring especially left far-lateral.   IMPRESSION: 1. Generalized spondylosis and lower lumbar facet osteoarthritis with L5-S1 facet ankylosis. 2. Widely patent canal  and foramina.     Electronically Signed   By: Jorje Guild M.D.   On: 08/31/2021 10:51   He reports that he quit smoking about 40 years ago. His smoking use included cigarettes. He has a 45.00 pack-year smoking history. He has never used smokeless tobacco. No results for input(s): HGBA1C, LABURIC in the last 8760 hours.  Objective:  VS:  HT:     WT:    BMI:      BP:(!) 162/92   HR:73bpm   TEMP: ( )   RESP:  Physical Exam Vitals and nursing note reviewed.  Constitutional:      General: He is not in acute distress.    Appearance: Normal appearance. He is not ill-appearing.  HENT:     Head: Normocephalic and atraumatic.     Right Ear: External ear normal.  Left Ear: External ear normal.     Nose: No congestion.  Eyes:     Extraocular Movements: Extraocular movements intact.  Cardiovascular:     Rate and Rhythm: Normal rate.     Pulses: Normal pulses.  Pulmonary:     Effort: Pulmonary effort is normal. No respiratory distress.  Abdominal:     General: There is no distension.     Palpations: Abdomen is soft.  Musculoskeletal:        General: No tenderness or signs of injury.     Cervical back: Neck supple.     Right lower leg: No edema.     Left lower leg: No edema.     Comments: Patient has good distal strength without clonus.  Concordant back pain with extension and facet loading particular on the right.  No active trigger points or tender points.  No pain with hip rotation particular internal rotation.  Patient does have pain over the right ischium and not left.  Skin:    Findings: No erythema or rash.  Neurological:     General: No focal deficit present.     Mental Status: He is alert and oriented to person, place, and time.     Sensory: No sensory deficit.     Motor: No weakness or abnormal muscle tone.     Coordination: Coordination normal.  Psychiatric:        Mood and Affect: Mood normal.        Behavior: Behavior normal.    Ortho Exam  Imaging: XR C-ARM NO  REPORT  Result Date: 09/27/2021 Please see Notes tab for imaging impression.   Past Medical/Family/Surgical/Social History: Medications & Allergies reviewed per EMR, new medications updated. Patient Active Problem List   Diagnosis Date Noted   Hx of colonic polyps 08/07/2018   Barrett's esophagus without dysplasia 05/10/2016   Incisional hernia 02/08/2013   GIST (gastrointestinal stromal tumor) of small bowel, malignant ([place]) 07/04/2011   Status post small bowel resection 06/22/2011   Abdominal mass 06/19/2011   Essential hypertension 06/19/2011   GI bleeding 06/17/2011   Acute blood loss anemia 06/17/2011   Syncope 06/17/2011   Iron deficiency anemia 04/19/2011   Schatzki's ring 01/03/2011   Colon adenoma 01/03/2011   Family hx of colon cancer 02/40/9735   HELICOBACTER PYLORI GASTRITIS 12/22/2009   BARRETTS ESOPHAGUS 12/22/2009   Past Medical History:  Diagnosis Date   Anemia    Barrett's esophagus 5/11   EGD Dr. Phillips Climes Barrett's, multiple antral/bulbar erosins, D2 erosion sec NSAIDs   Diverticulosis 5/11   Colonoscopy Dr Gala Romney   Family hx of colon cancer    GERD (gastroesophageal reflux disease)    Helicobacter pylori gastritis 5/11   HTN (hypertension)    Hx of adenomatous colonic polyps 8/10   Colonoscopy Dr. Gala Romney   Hyperlipemia    Schatzki's ring 5/11   62F dilator   Family History  Problem Relation Age of Onset   Diabetes Mother    Heart failure Mother    Prostate cancer Father 70   Colon cancer Brother 9   Past Surgical History:  Procedure Laterality Date   BIOPSY N/A 08/30/2016   Procedure: BIOPSY;  Surgeon: Rogene Houston, MD;  [facility];  Service: Endoscopy;  Laterality: N/A;  esophageal   BIOPSY  09/01/2021   Procedure: BIOPSY;  Surgeon: Rogene Houston, MD;  [facility];  Service: Endoscopy;;   CAPSULE ENDOSCOPY OF THE SMALL BOWEL  02/17/2011  COLONOSCOPY  05/19/2011   Procedure: COLONOSCOPY;  Surgeon:  Daneil Dolin, MD;  [facility];  Service: Endoscopy;  Laterality: N/A;  8:30   COLONOSCOPY N/A 08/21/2013   Procedure: COLONOSCOPY;  Surgeon: Rogene Houston, MD;  [facility];  Service: Endoscopy;  Laterality: N/A;  930   COLONOSCOPY N/A 02/26/2019   Procedure: COLONOSCOPY;  Surgeon: Rogene Houston, MD;  [facility];  Service: Endoscopy;  Laterality: N/A;  9:30-rescheduled to 5/20 @ 11:15 per Lelon Frohlich   EAR CYST EXCISION N/A 03/25/2014   Procedure: EXCISION OF NEOPLASM NECK;  Surgeon: Jamesetta So, MD;  Location: [facility];  Service: General;  Laterality: N/A;   ESOPHAGOGASTRODUODENOSCOPY  02/13/2011   Procedure: ESOPHAGOGASTRODUODENOSCOPY (EGD);  Surgeon: Daneil Dolin, MD;  [facility];  Service: Endoscopy;  Laterality: N/A;   ESOPHAGOGASTRODUODENOSCOPY N/A 08/30/2016   Procedure: ESOPHAGOGASTRODUODENOSCOPY (EGD);  Surgeon: Rogene Houston, MD;  [facility];  Service: Endoscopy;  Laterality: N/A;  730   ESOPHAGOGASTRODUODENOSCOPY (EGD) WITH PROPOFOL N/A 09/01/2021   Procedure: ESOPHAGOGASTRODUODENOSCOPY (EGD) WITH PROPOFOL;  Surgeon: Rogene Houston, MD;  [facility];  Service: Endoscopy;  Laterality: N/A;  910   excisional hernia  01/2013   GIVENS CAPSULE STUDY  02/15/2011   Procedure: GIVENS CAPSULE STUDY;  Surgeon: Daneil Dolin, MD;  [facility];  Service: Endoscopy;  Laterality: N/A;   POLYPECTOMY  02/26/2019   Procedure: POLYPECTOMY;  Surgeon: Rogene Houston, MD;  [facility];  Service: Endoscopy;;  colon   small intestine partial removal  06/2011   due to malignant tumor   VASECTOMY     Social History   Occupational History   Occupation: retired    Comment: Press photographer  Tobacco Use   Smoking status: Former    Packs/day: 1.50    Years: 30.00    Pack years: 45.00    Types: Cigarettes    Quit date: 03/18/1981    Years since quitting: 40.5   Smokeless tobacco: Never   Tobacco comments:    quit  1982  Vaping Use   Vaping Use: Never used  Substance and Sexual Activity   Alcohol use: Yes    Comment: two drinks a day bourbon   Drug use: No   Sexual activity: Not on file

## 2021-09-28 ENCOUNTER — Encounter: Payer: Self-pay | Admitting: Physical Medicine and Rehabilitation

## 2021-09-28 MED ORDER — BUPIVACAINE HCL 0.25 % IJ SOLN
4.0000 mL | INTRAMUSCULAR | Status: AC | PRN
  Administered 2021-09-27: 4 mL via INTRA_ARTICULAR

## 2021-09-28 MED ORDER — TRIAMCINOLONE ACETONIDE 40 MG/ML IJ SUSP
40.0000 mg | INTRAMUSCULAR | Status: AC | PRN
  Administered 2021-09-27: 40 mg via INTRA_ARTICULAR

## 2021-11-21 DIAGNOSIS — Z20822 Contact with and (suspected) exposure to covid-19: Secondary | ICD-10-CM | POA: Diagnosis not present

## 2021-12-19 DIAGNOSIS — D1801 Hemangioma of skin and subcutaneous tissue: Secondary | ICD-10-CM | POA: Diagnosis not present

## 2021-12-19 DIAGNOSIS — L2089 Other atopic dermatitis: Secondary | ICD-10-CM | POA: Diagnosis not present

## 2021-12-19 DIAGNOSIS — L812 Freckles: Secondary | ICD-10-CM | POA: Diagnosis not present

## 2021-12-19 DIAGNOSIS — L821 Other seborrheic keratosis: Secondary | ICD-10-CM | POA: Diagnosis not present

## 2021-12-19 DIAGNOSIS — L570 Actinic keratosis: Secondary | ICD-10-CM | POA: Diagnosis not present

## 2022-01-18 DIAGNOSIS — E785 Hyperlipidemia, unspecified: Secondary | ICD-10-CM | POA: Diagnosis not present

## 2022-01-24 DIAGNOSIS — I10 Essential (primary) hypertension: Secondary | ICD-10-CM | POA: Diagnosis not present

## 2022-01-24 DIAGNOSIS — E785 Hyperlipidemia, unspecified: Secondary | ICD-10-CM | POA: Diagnosis not present

## 2022-07-10 DIAGNOSIS — E785 Hyperlipidemia, unspecified: Secondary | ICD-10-CM | POA: Diagnosis not present

## 2022-07-10 DIAGNOSIS — Z79899 Other long term (current) drug therapy: Secondary | ICD-10-CM | POA: Diagnosis not present

## 2022-07-10 DIAGNOSIS — I10 Essential (primary) hypertension: Secondary | ICD-10-CM | POA: Diagnosis not present

## 2022-07-10 DIAGNOSIS — Z125 Encounter for screening for malignant neoplasm of prostate: Secondary | ICD-10-CM | POA: Diagnosis not present

## 2022-07-11 LAB — LAB REPORT - SCANNED: EGFR: 76

## 2022-07-17 DIAGNOSIS — I10 Essential (primary) hypertension: Secondary | ICD-10-CM | POA: Diagnosis not present

## 2022-07-17 DIAGNOSIS — I700 Atherosclerosis of aorta: Secondary | ICD-10-CM | POA: Diagnosis not present

## 2022-07-17 DIAGNOSIS — R051 Acute cough: Secondary | ICD-10-CM | POA: Diagnosis not present

## 2022-07-17 DIAGNOSIS — E785 Hyperlipidemia, unspecified: Secondary | ICD-10-CM | POA: Diagnosis not present

## 2022-07-29 DIAGNOSIS — Z23 Encounter for immunization: Secondary | ICD-10-CM | POA: Diagnosis not present

## 2022-09-19 DIAGNOSIS — H02831 Dermatochalasis of right upper eyelid: Secondary | ICD-10-CM | POA: Diagnosis not present

## 2022-09-19 DIAGNOSIS — Z961 Presence of intraocular lens: Secondary | ICD-10-CM | POA: Diagnosis not present

## 2022-09-19 DIAGNOSIS — H5213 Myopia, bilateral: Secondary | ICD-10-CM | POA: Diagnosis not present

## 2022-09-19 DIAGNOSIS — H52223 Regular astigmatism, bilateral: Secondary | ICD-10-CM | POA: Diagnosis not present

## 2022-09-28 ENCOUNTER — Encounter: Payer: Self-pay | Admitting: Radiology

## 2022-11-08 ENCOUNTER — Ambulatory Visit: Payer: Medicare HMO | Admitting: Plastic Surgery

## 2022-11-08 ENCOUNTER — Encounter: Payer: Self-pay | Admitting: Plastic Surgery

## 2022-11-08 VITALS — BP 161/70 | HR 83 | Ht 68.0 in | Wt 193.6 lb

## 2022-11-08 DIAGNOSIS — H02834 Dermatochalasis of left upper eyelid: Secondary | ICD-10-CM

## 2022-11-08 DIAGNOSIS — H02831 Dermatochalasis of right upper eyelid: Secondary | ICD-10-CM

## 2022-11-08 NOTE — Progress Notes (Signed)
Referring Provider David Perches, MD [address] [place],  [place] [address]   CC:  Chief Complaint  Patient presents with   Advice Only      David Weiss is an 77 y.o. male.  HPI: Mr. David Weiss is a 77 year old male who presents today for discussion of an upper lid blepharoplasty.  Patient states that he has had increasing difficulty with judging distance and this is most notable when he parks.  He tends to park his car before reaching the end of the parking place.  He is curious as to whether a blepharoplasty will improve this or not.  Allergies  Allergen Reactions   Penicillins Itching    Did it involve swelling of the face/tongue/throat, SOB, or low BP? Unknown Did it involve sudden or severe rash/hives, skin peeling, or any reaction on the inside of your mouth or nose? Unknown Did you need to seek medical attention at a hospital or doctor's office? Unknown When did it last happen?childhood reaction  If all above answers are "NO", may proceed with cephalosporin use.    Pantoprazole Sodium Itching and Rash    Outpatient Encounter Medications as of 11/08/2022  Medication Sig   amLODipine (NORVASC) 5 MG tablet Take 5 mg by mouth daily.   atorvastatin (LIPITOR) 10 MG tablet Take 10 mg by mouth daily.   benazepril (LOTENSIN) 10 MG tablet Take 10 mg by mouth daily.   Emollient (LUBRIDERM EX) Apply 1 application topically daily as needed (dry skin/ eczema).   omeprazole (PRILOSEC) 20 MG capsule Take 20 mg by mouth daily.   [DISCONTINUED] acetaminophen (TYLENOL) 500 MG tablet Take 500 mg by mouth every 6 (six) hours as needed for moderate pain or headache.   [DISCONTINUED] Skin Protectants, Misc. (EUCERIN) cream Apply 1 application topically as needed for dry skin (eczema).   [DISCONTINUED] zolpidem (AMBIEN) 10 MG tablet Take 10 mg by mouth at bedtime as needed for sleep.    No facility-administered encounter medications on file as of 11/08/2022.     Past Medical History:   Diagnosis Date   Anemia    Barrett's esophagus 5/11   EGD Dr. Edgardo Roys Barrett's, multiple antral/bulbar erosins, D2 erosion sec NSAIDs   Diverticulosis 5/11   Colonoscopy Dr Jena Gauss   Family hx of colon cancer    GERD (gastroesophageal reflux disease)    Helicobacter pylori gastritis 5/11   HTN (hypertension)    Hx of adenomatous colonic polyps 8/10   Colonoscopy Dr. Jena Gauss   Hyperlipemia    Schatzki's ring 5/11   75F dilator    Past Surgical History:  Procedure Laterality Date   BIOPSY N/A 08/30/2016   Procedure: BIOPSY;  Surgeon: Malissa Hippo, MD;  [facility];  Service: Endoscopy;  Laterality: N/A;  esophageal   BIOPSY  09/01/2021   Procedure: BIOPSY;  Surgeon: Malissa Hippo, MD;  [facility];  Service: Endoscopy;;   CAPSULE ENDOSCOPY OF THE SMALL BOWEL  02/17/2011       COLONOSCOPY  05/19/2011   Procedure: COLONOSCOPY;  Surgeon: Corbin Ade, MD;  [facility];  Service: Endoscopy;  Laterality: N/A;  8:30   COLONOSCOPY N/A 08/21/2013   Procedure: COLONOSCOPY;  Surgeon: Malissa Hippo, MD;  [facility];  Service: Endoscopy;  Laterality: N/A;  930   COLONOSCOPY N/A 02/26/2019   Procedure: COLONOSCOPY;  Surgeon: Malissa Hippo, MD;  [facility];  Service: Endoscopy;  Laterality: N/A;  9:30-rescheduled to 5/20 @  11:15 per Dewayne Hatch   EAR CYST EXCISION N/A 03/25/2014   Procedure: EXCISION OF NEOPLASM NECK;  Surgeon: Dalia Heading, MD;  [facility];  Service: General;  Laterality: N/A;   ESOPHAGOGASTRODUODENOSCOPY  02/13/2011   Procedure: ESOPHAGOGASTRODUODENOSCOPY (EGD);  Surgeon: Corbin Ade, MD;  [facility];  Service: Endoscopy;  Laterality: N/A;   ESOPHAGOGASTRODUODENOSCOPY N/A 08/30/2016   Procedure: ESOPHAGOGASTRODUODENOSCOPY (EGD);  Surgeon: Malissa Hippo, MD;  [facility];  Service: Endoscopy;  Laterality: N/A;  730   ESOPHAGOGASTRODUODENOSCOPY (EGD) WITH PROPOFOL N/A 09/01/2021    Procedure: ESOPHAGOGASTRODUODENOSCOPY (EGD) WITH PROPOFOL;  Surgeon: Malissa Hippo, MD;  [facility];  Service: Endoscopy;  Laterality: N/A;  910   excisional hernia  01/2013   GIVENS CAPSULE STUDY  02/15/2011   Procedure: GIVENS CAPSULE STUDY;  Surgeon: Corbin Ade, MD;  [facility];  Service: Endoscopy;  Laterality: N/A;   POLYPECTOMY  02/26/2019   Procedure: POLYPECTOMY;  Surgeon: Malissa Hippo, MD;  [facility];  Service: Endoscopy;;  colon   small intestine partial removal  06/2011   due to malignant tumor   VASECTOMY      Family History  Problem Relation Age of Onset   Diabetes Mother    Heart failure Mother    Prostate cancer Father 83   Colon cancer Brother 14    Social History   Social History Narrative   Lives w/ wife     Review of Systems General: Denies fevers, chills, weight loss CV: Denies chest pain, shortness of breath, palpitations Upper lid skin: Patient has evidence of upper lid skin bilaterally with the right greater than the left  Physical Exam    11/08/2022    8:03 AM 09/27/2021    8:14 AM 09/13/2021    3:31 PM  Vitals with BMI  Height 5\' 8"     Weight 193 lbs 10 oz    BMI 29.44    Systolic 161 162 829  Diastolic 70 92 65  Pulse 83 73 74    General:  No acute distress,  Alert and oriented, Non-Toxic, Normal speech and affect Eyes: Patient has good brow position and has minimal senile ptosis.  He still has a normal lid crease and the lid to pupillary position is acceptable.  He does have excess upper lid skin but this is mostly medially with only a small amount of skin hooding the lateral portion of the right eye.   Assessment/Plan Dermatochalasis: Patient has significant upper lid skin but mostly on the medial aspect of the eyelid.  This would be amenable to resection however I sincerely doubt that he will get an improvement in his lateral visual fields and it definitely will not improve his depth  perception.  We discussed this at length.  He has not had visual field testing yet and will see his ophthalmologist in regards to visual field testing.  Once these results are back we will discuss further whether he would like to proceed with surgical resection of the upper lid skin or not.  Santiago Glad 11/08/2022, 8:24 AM

## 2022-12-06 ENCOUNTER — Ambulatory Visit (INDEPENDENT_AMBULATORY_CARE_PROVIDER_SITE_OTHER): Payer: Medicare HMO | Admitting: Internal Medicine

## 2022-12-06 ENCOUNTER — Encounter: Payer: Self-pay | Admitting: Internal Medicine

## 2022-12-06 VITALS — BP 138/72 | HR 64 | Ht 68.0 in | Wt 195.8 lb

## 2022-12-06 DIAGNOSIS — E782 Mixed hyperlipidemia: Secondary | ICD-10-CM

## 2022-12-06 DIAGNOSIS — I10 Essential (primary) hypertension: Secondary | ICD-10-CM

## 2022-12-06 DIAGNOSIS — R350 Frequency of micturition: Secondary | ICD-10-CM

## 2022-12-06 DIAGNOSIS — Z0001 Encounter for general adult medical examination with abnormal findings: Secondary | ICD-10-CM | POA: Diagnosis not present

## 2022-12-06 DIAGNOSIS — K222 Esophageal obstruction: Secondary | ICD-10-CM

## 2022-12-06 DIAGNOSIS — M542 Cervicalgia: Secondary | ICD-10-CM | POA: Diagnosis not present

## 2022-12-06 DIAGNOSIS — C49A3 Gastrointestinal stromal tumor of small intestine: Secondary | ICD-10-CM

## 2022-12-06 DIAGNOSIS — G8929 Other chronic pain: Secondary | ICD-10-CM

## 2022-12-06 DIAGNOSIS — N401 Enlarged prostate with lower urinary tract symptoms: Secondary | ICD-10-CM | POA: Diagnosis not present

## 2022-12-06 DIAGNOSIS — Z9049 Acquired absence of other specified parts of digestive tract: Secondary | ICD-10-CM

## 2022-12-06 DIAGNOSIS — K2270 Barrett's esophagus without dysplasia: Secondary | ICD-10-CM

## 2022-12-06 DIAGNOSIS — R351 Nocturia: Secondary | ICD-10-CM

## 2022-12-06 MED ORDER — TAMSULOSIN HCL 0.4 MG PO CAPS: 0.4000 mg | ORAL_CAPSULE | Freq: Every day | ORAL | 3 refills | Status: DC

## 2022-12-06 NOTE — Patient Instructions (Signed)
It was a pleasure to see you today.  Thank you for giving [place] the opportunity to be involved in your care.  Below is a brief recap of your visit and next steps.  We will plan to see you again in 7 months.  Summary You have established care today We will request records from Dr. Ouida Sills GI referral placed Follow up in 7 months

## 2022-12-06 NOTE — Progress Notes (Unsigned)
New Patient Office Visit  Subjective    Patient ID: David Weiss, male    DOB: [birth year]  Age: 77 y.o. MRN: [id number]  CC:  Chief Complaint  Patient presents with   Establish Care    HPI David Weiss presents to establish care.  He is a 77 year old male who endorses a past medical history significant for HTN, HLD, GERD, Barrett's esophagus, history of GIST s/p resection (2012).  He was previously followed by Dr. Ouida Sills. Mr. Arnwine reports feeling well today.  His acute concerns are chronic neck pain and nocturia.  He states that during a previous hospital admission he was given a medication that alleviated his nocturia.  He is interested in starting medication today.  He previously worked in [employer].  Mr. Silvey endorses former tobacco use, prior to quitting in the 42s.  He has 3 bourbon drinks daily and has been doing this for many years.  He denies illicit drug use.  His family medical history is significant for diabetes mellitus, prostate cancer, and CAD.  Acute concerns, chronic medical conditions, and outstanding preventative care items discussed today are individually addressed A/P below.   Outpatient Encounter Medications as of 12/06/2022  Medication Sig   amLODipine (NORVASC) 5 MG tablet Take 5 mg by mouth daily.   atorvastatin (LIPITOR) 10 MG tablet Take 10 mg by mouth daily.   benazepril (LOTENSIN) 10 MG tablet Take 10 mg by mouth daily.   omeprazole (PRILOSEC) 20 MG capsule Take 20 mg by mouth daily.   tamsulosin (FLOMAX) 0.4 MG CAPS capsule Take 1 capsule (0.4 mg total) by mouth daily.   [DISCONTINUED] Emollient (LUBRIDERM EX) Apply 1 application topically daily as needed (dry skin/ eczema).   No facility-administered encounter medications on file as of 12/06/2022.    Past Medical History:  Diagnosis Date   Anemia    Barrett's esophagus 11/2009   EGD Dr. Edgardo Roys Barrett's, multiple antral/bulbar erosins, D2 erosion sec NSAIDs   Blood transfusion without  reported diagnosis    Cancer [facility])    Clotting disorder [facility])    Diverticulosis 11/2009   Colonoscopy Dr Jena Gauss   Family hx of colon cancer    GERD (gastroesophageal reflux disease)    Helicobacter pylori gastritis 11/2009   HTN (hypertension)    Hx of adenomatous colonic polyps 02/2009   Colonoscopy Dr. Jena Gauss   Hyperlipemia    Schatzki's ring 11/2009   42F dilator    Past Surgical History:  Procedure Laterality Date   BIOPSY N/A 08/30/2016   Procedure: BIOPSY;  Surgeon: Malissa Hippo, MD;  [facility];  Service: Endoscopy;  Laterality: N/A;  esophageal   BIOPSY  09/01/2021   Procedure: BIOPSY;  Surgeon: Malissa Hippo, MD;  [facility];  Service: Endoscopy;;   CAPSULE ENDOSCOPY OF THE SMALL BOWEL  02/17/2011       COLON SURGERY     COLONOSCOPY  05/19/2011   Procedure: COLONOSCOPY;  Surgeon: Corbin Ade, MD;  [facility];  Service: Endoscopy;  Laterality: N/A;  8:30   COLONOSCOPY N/A 08/21/2013   Procedure: COLONOSCOPY;  Surgeon: Malissa Hippo, MD;  [facility];  Service: Endoscopy;  Laterality: N/A;  930   COLONOSCOPY N/A 02/26/2019   Procedure: COLONOSCOPY;  Surgeon: Malissa Hippo, MD;  [facility];  Service: Endoscopy;  Laterality: N/A;  9:30-rescheduled to 5/20 @ 11:15 per Dewayne Hatch   EAR CYST EXCISION N/A 03/25/2014   Procedure: EXCISION  OF NEOPLASM NECK;  Surgeon: Dalia Heading, MD;  [facility];  Service: General;  Laterality: N/A;   ESOPHAGOGASTRODUODENOSCOPY  02/13/2011   Procedure: ESOPHAGOGASTRODUODENOSCOPY (EGD);  Surgeon: Corbin Ade, MD;  [facility];  Service: Endoscopy;  Laterality: N/A;   ESOPHAGOGASTRODUODENOSCOPY N/A 08/30/2016   Procedure: ESOPHAGOGASTRODUODENOSCOPY (EGD);  Surgeon: Malissa Hippo, MD;  [facility];  Service: Endoscopy;  Laterality: N/A;  730   ESOPHAGOGASTRODUODENOSCOPY (EGD) WITH PROPOFOL N/A 09/01/2021   Procedure: ESOPHAGOGASTRODUODENOSCOPY  (EGD) WITH PROPOFOL;  Surgeon: Malissa Hippo, MD;  [facility];  Service: Endoscopy;  Laterality: N/A;  910   excisional hernia  01/28/2013   EYE SURGERY     GIVENS CAPSULE STUDY  02/15/2011   Procedure: GIVENS CAPSULE STUDY;  Surgeon: Corbin Ade, MD;  [facility];  Service: Endoscopy;  Laterality: N/A;   HERNIA REPAIR     POLYPECTOMY  02/26/2019   Procedure: POLYPECTOMY;  Surgeon: Malissa Hippo, MD;  [facility];  Service: Endoscopy;;  colon   small intestine partial removal  06/01/2011   due to malignant tumor   VASECTOMY      Family History  Problem Relation Age of Onset   Diabetes Mother    Heart failure Mother    Hearing loss Mother    Heart disease Mother    Prostate cancer Father 71   Cancer Father    Hearing loss Father    Heart disease Father    Cancer Brother    Hearing loss Brother    Colon cancer Brother 62   Hearing loss Sister     Social History   Socioeconomic History   Marital status: Married    Spouse name: Not on file   Number of children: 2   Years of education: Not on file   Highest education level: Not on file  Occupational History   Occupation: retired    Comment: Airline pilot  Tobacco Use   Smoking status: Former    Packs/day: 2.00    Years: 20.00    Additional pack years: 0.00    Total pack years: 40.00    Types: Cigarettes    Quit date: 03/14/1981    Years since quitting: 41.7   Smokeless tobacco: Never   Tobacco comments:    quit 1982  Vaping Use   Vaping Use: Never used  Substance and Sexual Activity   Alcohol use: Yes    Alcohol/week: 15.0 standard drinks of alcohol    Types: 15 Standard drinks or equivalent per week    Comment: two drinks a day bourbon   Drug use: No   Sexual activity: Yes  Other Topics Concern   Not on file  Social History Narrative   Lives w/ wife   Social Determinants of Health   Financial Resource Strain: Not on file  Food Insecurity: Not on file   Transportation Needs: Not on file  Physical Activity: Not on file  Stress: Not on file  Social Connections: Not on file  Intimate Partner Violence: Not on file   Review of Systems  Constitutional:  Negative for chills and fever.  HENT:  Negative for sore throat.   Respiratory:  Negative for cough and shortness of breath.   Cardiovascular:  Negative for chest pain, palpitations and leg swelling.  Gastrointestinal:  Negative for abdominal pain, blood in stool, constipation, diarrhea, nausea and vomiting.  Genitourinary:  Negative for dysuria and hematuria.  Nocturia  Musculoskeletal:  Positive for neck pain (Chronic). Negative for myalgias.  Skin:  Negative for itching and rash.  Neurological:  Negative for dizziness and headaches.  Psychiatric/Behavioral:  Negative for depression and suicidal ideas.   All other systems reviewed and are negative.  Objective    BP 138/72   Pulse 64   Ht 5\' 8"  (1.727 m)   Wt 195 lb 12.8 oz (88.8 kg)   SpO2 94%   BMI 29.77 kg/m   Physical Exam Vitals reviewed.  Constitutional:      General: He is not in acute distress.    Appearance: Normal appearance. He is obese. He is not ill-appearing.  HENT:     Head: Normocephalic and atraumatic.     Right Ear: External ear normal.     Left Ear: External ear normal.     Nose: Nose normal. No congestion or rhinorrhea.     Mouth/Throat:     Mouth: Mucous membranes are moist.     Pharynx: Oropharynx is clear.  Eyes:     General: No scleral icterus.    Extraocular Movements: Extraocular movements intact.     Conjunctiva/sclera: Conjunctivae normal.     Pupils: Pupils are equal, round, and reactive to light.  Cardiovascular:     Rate and Rhythm: Normal rate and regular rhythm.     Pulses: Normal pulses.     Heart sounds: Normal heart sounds. No murmur heard. Pulmonary:     Effort: Pulmonary effort is normal.     Breath sounds: Normal breath sounds. No wheezing, rhonchi or rales.  Abdominal:      General: Abdomen is flat. Bowel sounds are normal. There is no distension.     Palpations: Abdomen is soft.     Tenderness: There is no abdominal tenderness.  Musculoskeletal:        General: No swelling or deformity. Normal range of motion.     Cervical back: Normal range of motion.  Skin:    General: Skin is warm and dry.     Capillary Refill: Capillary refill takes less than 2 seconds.  Neurological:     General: No focal deficit present.     Mental Status: He is alert and oriented to person, place, and time.     Motor: No weakness.  Psychiatric:        Mood and Affect: Mood normal.        Behavior: Behavior normal.        Thought Content: Thought content normal.    Assessment & Plan:   Problem List Items Addressed This Visit       Essential hypertension    Currently prescribed amlodipine 5 mg daily and benazepril 10 mg daily for treatment of hypertension.  BP today is acceptable. -No medication changes today      Barrett's esophagus without dysplasia - Primary    Documented history of Barrett's esophagus without dysplasia.  Previously followed by Dr. Karilyn Cota.  Last EGD performed in February 2023.  Repeat EGD recommended for 5 years.  He is currently prescribed omeprazole 20 mg daily. -No medication changes today -Gastroenterology referral placed at patient's request      Hyperlipidemia    Currently prescribed atorvastatin 10 mg daily.  Lipid panel updated in December 2023 and at goal. -No medication changes today      Chronic neck pain    He endorses a history of chronic bilateral neck pain.  Pain is exacerbated with lateral movements of the head. -Home PT  exercises provided today      BPH associated with nocturia    He endorses recent nocturia.  He is interested in starting medication as previously provided during a hospital admission. -Trial Flomax 0.4 mg nightly      Encounter for general adult medical examination with abnormal findings    Presenting  today to establish care.  Available records and labs have been reviewed. -Labs from December 2023 reviewed -Records from Dr. Ouida Sills requested today -Gastroenterology referral placed -We will tentatively plan for follow-up in December for CPE      Return in about 7 months (around 07/08/2023) for CPE.   Billie Lade, MD

## 2022-12-07 DIAGNOSIS — E785 Hyperlipidemia, unspecified: Secondary | ICD-10-CM | POA: Insufficient documentation

## 2022-12-07 DIAGNOSIS — G8929 Other chronic pain: Secondary | ICD-10-CM | POA: Insufficient documentation

## 2022-12-07 DIAGNOSIS — Z0001 Encounter for general adult medical examination with abnormal findings: Secondary | ICD-10-CM | POA: Insufficient documentation

## 2022-12-07 DIAGNOSIS — N401 Enlarged prostate with lower urinary tract symptoms: Secondary | ICD-10-CM | POA: Insufficient documentation

## 2022-12-07 NOTE — Assessment & Plan Note (Signed)
He endorses recent nocturia.  He is interested in starting medication as previously provided during a hospital admission. -Trial Flomax 0.4 mg nightly

## 2022-12-07 NOTE — Assessment & Plan Note (Signed)
Presenting today to establish care.  Available records and labs have been reviewed. -Labs from December 2023 reviewed -Records from Dr. Ouida Sills requested today -Gastroenterology referral placed -We will tentatively plan for follow-up in December for CPE

## 2022-12-07 NOTE — Assessment & Plan Note (Signed)
He endorses a history of chronic bilateral neck pain.  Pain is exacerbated with lateral movements of the head. -Home PT exercises provided today

## 2022-12-07 NOTE — Assessment & Plan Note (Signed)
Currently prescribed atorvastatin 10 mg daily.  Lipid panel updated in December 2023 and at goal. -No medication changes today

## 2022-12-07 NOTE — Assessment & Plan Note (Signed)
Documented history of Barrett's esophagus without dysplasia.  Previously followed by Dr. Karilyn Cota.  Last EGD performed in February 2023.  Repeat EGD recommended for 5 years.  He is currently prescribed omeprazole 20 mg daily. -No medication changes today -Gastroenterology referral placed at patient's request

## 2022-12-07 NOTE — Assessment & Plan Note (Signed)
Currently prescribed amlodipine 5 mg daily and benazepril 10 mg daily for treatment of hypertension.  BP today is acceptable. -No medication changes today

## 2022-12-20 ENCOUNTER — Encounter: Payer: Self-pay | Admitting: Internal Medicine

## 2023-01-31 ENCOUNTER — Encounter: Payer: Self-pay | Admitting: Family Medicine

## 2023-01-31 ENCOUNTER — Telehealth (INDEPENDENT_AMBULATORY_CARE_PROVIDER_SITE_OTHER): Payer: Medicare HMO | Admitting: Family Medicine

## 2023-01-31 VITALS — Ht 68.0 in | Wt 192.0 lb

## 2023-01-31 DIAGNOSIS — U071 COVID-19: Secondary | ICD-10-CM

## 2023-01-31 MED ORDER — NIRMATRELVIR/RITONAVIR (PAXLOVID)TABLET: 3.0000 | ORAL_TABLET | Freq: Two times a day (BID) | ORAL | 0 refills | Status: AC

## 2023-01-31 NOTE — Progress Notes (Signed)
Virtual Visit via Video Note  I connected with David Weiss on 01/31/23 at  2:20 PM EDT by a video enabled telemedicine application and verified that I am speaking with the correct person using two identifiers.  Patient Location: Skilled Nursing Facility Provider Location: Office/Clinic  I discussed the limitations, risks, security, and privacy concerns of performing an evaluation and management service by video and the availability of in person appointments. I also discussed with the patient that there may be a patient responsible charge related to this service. The patient expressed understanding and agreed to proceed.  Subjective: PCP: Billie Lade, MD  Chief Complaint  Patient presents with   Covid Positive    Pt took an at home covid test today having sx of: sneezing , runny nose and feeling achy.  Was around someone that had covid on Saturday night.   HPI The patient tested positive for COVID today, 01/31/2023.  He complains of body aches, cough, sneezing, runny nose.  He reports exposure to COVID.  No fever reported.   ROS: Per HPI  Current Outpatient Medications:    amLODipine (NORVASC) 5 MG tablet, Take 5 mg by mouth daily., Disp: , Rfl:    atorvastatin (LIPITOR) 10 MG tablet, Take 10 mg by mouth daily., Disp: , Rfl:    benazepril (LOTENSIN) 10 MG tablet, Take 10 mg by mouth daily., Disp: , Rfl:    nirmatrelvir/ritonavir (PAXLOVID) 20 x 150 MG & 10 x 100MG  TABS, Take 3 tablets by mouth 2 (two) times daily for 5 days. (Take nirmatrelvir 150 mg two tablets twice daily for 5 days and ritonavir 100 mg one tablet twice daily for 5 days) Patient GFR is 76, Disp: 30 tablet, Rfl: 0   omeprazole (PRILOSEC) 20 MG capsule, Take 20 mg by mouth daily., Disp: , Rfl:    tamsulosin (FLOMAX) 0.4 MG CAPS capsule, Take 1 capsule (0.4 mg total) by mouth daily., Disp: 30 capsule, Rfl: 3  Observations/Objective: Today's Vitals   01/31/23 1421  Weight: 192 lb (87.1 kg)  Height: 5\' 8"   (1.727 m)   Physical Exam Head is normocephalic, atraumatic.  No labored breathing.  Speech is clear and coherent with logical content.  Patient is alert and oriented at baseline.   Assessment and Plan: Positive self-administered antigen test for COVID-19 -     nirmatrelvir/ritonavir; Take 3 tablets by mouth 2 (two) times daily for 5 days. (Take nirmatrelvir 150 mg two tablets twice daily for 5 days and ritonavir 100 mg one tablet twice daily for 5 days) Patient GFR is 76  Dispense: 30 tablet; Refill: 0  Advised not to take lipitor and tamsulosin while taking paxlovid Take medication as prescribed. Increase fluids and allow for plenty of rest. Recommend Tylenol as needed for pain, fever, or general discomfort. Recommend using a humidifier at bedtime during sleep to help with cough and nasal congestion.   Follow Up Instructions: No follow-ups on file.  Follow-up if your symptoms do not improve   I discussed the assessment and treatment plan with the patient. The patient was provided an opportunity to ask questions, and all were answered. The patient agreed with the plan and demonstrated an understanding of the instructions.   The patient was advised to call back or seek an in-person evaluation if the symptoms worsen or if the condition fails to improve as anticipated.  The above assessment and management plan was discussed with the patient. The patient verbalized understanding of and has agreed to the management  plan.   Gilmore Laroche, FNP

## 2023-02-05 ENCOUNTER — Telehealth: Payer: Self-pay | Admitting: Internal Medicine

## 2023-02-05 ENCOUNTER — Other Ambulatory Visit: Payer: Self-pay

## 2023-02-05 MED ORDER — BENAZEPRIL HCL 10 MG PO TABS: 10.0000 mg | ORAL_TABLET | Freq: Every day | ORAL | 1 refills | Status: DC

## 2023-02-05 NOTE — Telephone Encounter (Signed)
Prescription Request  02/05/2023  LOV: 12/06/2022  What is the name of the medication or equipment? Benazepril 10 mg   Have you contacted your pharmacy to request a refill? Yes   Which pharmacy would you like this sent to?  [facility] - [place], [place] - [address] [place] [address] Phone: [phone] Fax: [phone]    Patient notified that their request is being sent to the clinical staff for review and that they should receive a response within 2 business days.   Please advise at Mobile [phone] (mobile)

## 2023-02-05 NOTE — Telephone Encounter (Signed)
Refill sent.

## 2023-02-12 ENCOUNTER — Ambulatory Visit (INDEPENDENT_AMBULATORY_CARE_PROVIDER_SITE_OTHER): Payer: Medicare HMO | Admitting: Gastroenterology

## 2023-02-12 ENCOUNTER — Encounter (INDEPENDENT_AMBULATORY_CARE_PROVIDER_SITE_OTHER): Payer: Self-pay | Admitting: Gastroenterology

## 2023-02-12 VITALS — BP 138/72 | HR 59 | Temp 97.8°F | Ht 68.0 in | Wt 195.4 lb

## 2023-02-12 DIAGNOSIS — K2270 Barrett's esophagus without dysplasia: Secondary | ICD-10-CM

## 2023-02-12 DIAGNOSIS — Z86010 Personal history of colonic polyps: Secondary | ICD-10-CM | POA: Diagnosis not present

## 2023-02-12 NOTE — Progress Notes (Signed)
David Weiss, M.D. Gastroenterology & Hepatology [facility] [address] [place], [place] [address] Primary Care Physician: Billie Lade, MD [address] [place] [address]  Referring MD: PCP  Chief Complaint: GERD, Barrett's esophagus and history of colon polyps  History of Present Illness: David Weiss is a 77 y.o. male with PMH jejunal GIST s/p resection, GERD, Barrett's esophagus, hyperlipidemia, hypertension, who presents for evaluation of GERD, Barrett's esophagus and history of colonic polyps.  Patient has had a longstanding history of GERD which he has been managed with PPIs chronically.  Patient states he may have heartburn 1-2 times  a year, but otherwise all is controlled with omeprazole 20 mg qday. No odynophagia or dysphagia. The patient denies having any nausea, vomiting, fever, chills, hematochezia, melena, hematemesis, abdominal distention, abdominal pain, diarrhea, jaundice, pruritus or weight loss.  Last EGD:09/01/2021 - Normal hypopharynx. - Normal proximal esophagus and mid esophagus. - Esophageal mucosal changes secondary to established short- segment Barrett' s disease. Biopsied. - Z- line regular, 38 cm from the incisors. - 2 cm hiatal hernia. - Normal stomach. - Normal duodenal bulb and second portion of the duodenum.  Path: A. ESOPHAGUS, DISTAL, BIOPSY:  - Barretts esophagus, negative for dysplasia   Last Colonoscopy:01/2019 - Three small polyps at the splenic flexure and in the ascending colon, removed with a cold snare. Resected and retrieved. - Two small polyps at the hepatic flexure. Biopsied. - Diverticulosis in the sigmoid colon. Path: tubular adenomas x4  Recommended repeat in 5 years.  FHx: neg for any gastrointestinal/liver disease, brother colon cancer in his 33s, father prostate cancer Social: quit smoking in 90s, drinks couple of alcohol drinks per day, neg illicit drug use Surgical:  small bowel resection for GIST, hernia repair  Past Medical History: Past Medical History:  Diagnosis Date   Anemia    Barrett's esophagus 11/2009   EGD Dr. Edgardo Roys Barrett's, multiple antral/bulbar erosins, D2 erosion sec NSAIDs   Blood transfusion without reported diagnosis    Cancer (HCC)    Clotting disorder (HCC)    Diverticulosis 11/2009   Colonoscopy Dr Jena Gauss   Family hx of colon cancer    GERD (gastroesophageal reflux disease)    Helicobacter pylori gastritis 11/2009   HTN (hypertension)    Hx of adenomatous colonic polyps 02/2009   Colonoscopy Dr. Jena Gauss   Hyperlipemia    Schatzki's ring 11/2009   73F dilator    Past Surgical History: Past Surgical History:  Procedure Laterality Date   BIOPSY N/A 08/30/2016   Procedure: BIOPSY;  Surgeon: Malissa Hippo, MD;  [facility];  Service: Endoscopy;  Laterality: N/A;  esophageal   BIOPSY  09/01/2021   Procedure: BIOPSY;  Surgeon: Malissa Hippo, MD;  [facility];  Service: Endoscopy;;   CAPSULE ENDOSCOPY OF THE SMALL BOWEL  02/17/2011       COLON SURGERY     COLONOSCOPY  05/19/2011   Procedure: COLONOSCOPY;  Surgeon: Corbin Ade, MD;  [facility]: [facility];  Service: Endoscopy;  Laterality: N/A;  8:30   COLONOSCOPY N/A 08/21/2013   Procedure: COLONOSCOPY;  Surgeon: Malissa Hippo, MD;  [facility];  Service: Endoscopy;  Laterality: N/A;  930   COLONOSCOPY N/A 02/26/2019   Procedure: COLONOSCOPY;  Surgeon: Malissa Hippo, MD;  [facility];  Service: Endoscopy;  Laterality: N/A;  9:30-rescheduled to 5/20 @ 11:15 per Dewayne Hatch   EAR CYST EXCISION  N/A 03/25/2014   Procedure: EXCISION OF NEOPLASM NECK;  Surgeon: Dalia Heading, MD;  Location: [facility];  Service: General;  Laterality: N/A;   ESOPHAGOGASTRODUODENOSCOPY  02/13/2011   Procedure: ESOPHAGOGASTRODUODENOSCOPY (EGD);  Surgeon: Corbin Ade, MD;  [facility];  Service: Endoscopy;  Laterality: N/A;    ESOPHAGOGASTRODUODENOSCOPY N/A 08/30/2016   Procedure: ESOPHAGOGASTRODUODENOSCOPY (EGD);  Surgeon: Malissa Hippo, MD;  [facility];  Service: Endoscopy;  Laterality: N/A;  730   ESOPHAGOGASTRODUODENOSCOPY (EGD) WITH PROPOFOL N/A 09/01/2021   Procedure: ESOPHAGOGASTRODUODENOSCOPY (EGD) WITH PROPOFOL;  Surgeon: Malissa Hippo, MD;  [facility];  Service: Endoscopy;  Laterality: N/A;  910   excisional hernia  01/28/2013   EYE SURGERY     GIVENS CAPSULE STUDY  02/15/2011   Procedure: GIVENS CAPSULE STUDY;  Surgeon: Corbin Ade, MD;  [facility];  Service: Endoscopy;  Laterality: N/A;   HERNIA REPAIR     POLYPECTOMY  02/26/2019   Procedure: POLYPECTOMY;  Surgeon: Malissa Hippo, MD;  [facility];  Service: Endoscopy;;  colon   small intestine partial removal  06/01/2011   due to malignant tumor   VASECTOMY      Family History: Family History  Problem Relation Age of Onset   Diabetes Mother    Heart failure Mother    Hearing loss Mother    Heart disease Mother    Prostate cancer Father 85   Cancer Father    Hearing loss Father    Heart disease Father    Cancer Brother    Hearing loss Brother    Colon cancer Brother 62   Hearing loss Sister     Social History: Social History   Tobacco Use  Smoking Status Former   Current packs/day: 0.00   Average packs/day: 2.0 packs/day for 20.0 years (40.0 ttl pk-yrs)   Types: Cigarettes   Start date: 03/14/1961   Quit date: 03/14/1981   Years since quitting: 41.9  Smokeless Tobacco Never  Tobacco Comments   quit 1982   Social History   Substance and Sexual Activity  Alcohol Use Yes   Alcohol/week: 15.0 standard drinks of alcohol   Types: 15 Standard drinks or equivalent per week   Comment: two drinks a day bourbon   Social History   Substance and Sexual Activity  Drug Use No    Allergies: Allergies  Allergen Reactions   Penicillins Itching    Did it involve  swelling of the face/tongue/throat, SOB, or low BP? Unknown Did it involve sudden or severe rash/hives, skin peeling, or any reaction on the inside of your mouth or nose? Unknown Did you need to seek medical attention at a hospital or doctor's office? Unknown When did it last happen?childhood reaction  If all above answers are "NO", may proceed with cephalosporin use.    Pantoprazole Sodium Itching and Rash    Medications: Current Outpatient Medications  Medication Sig Dispense Refill   amLODipine (NORVASC) 5 MG tablet Take 5 mg by mouth daily.     atorvastatin (LIPITOR) 10 MG tablet Take 10 mg by mouth daily.     benazepril (LOTENSIN) 10 MG tablet Take 1 tablet (10 mg total) by mouth daily. 30 tablet 1   omeprazole (PRILOSEC) 20 MG capsule Take 20 mg by mouth daily.     OVER THE COUNTER MEDICATION B12 once per day.   Vit E once per day.     No current facility-administered medications for this visit.  Review of Systems: GENERAL: negative for malaise, night sweats HEENT: No changes in hearing or vision, no nose bleeds or other nasal problems. NECK: Negative for lumps, goiter, pain and significant neck swelling RESPIRATORY: Negative for cough, wheezing CARDIOVASCULAR: Negative for chest pain, leg swelling, palpitations, orthopnea GI: SEE HPI MUSCULOSKELETAL: Negative for joint pain or swelling, back pain, and muscle pain. SKIN: Negative for lesions, rash PSYCH: Negative for sleep disturbance, mood disorder and recent psychosocial stressors. HEMATOLOGY Negative for prolonged bleeding, bruising easily, and swollen nodes. ENDOCRINE: Negative for cold or heat intolerance, polyuria, polydipsia and goiter. NEURO: negative for tremor, gait imbalance, syncope and seizures. The remainder of the review of systems is noncontributory.   Physical Exam: BP 138/72 (BP Location: Left Arm, Patient Position: Sitting, Cuff Size: Normal)   Pulse (!) 59   Temp 97.8 F (36.6 C) (Temporal)   Ht  5\' 8"  (1.727 m)   Wt 195 lb 6.4 oz (88.6 kg)   BMI 29.71 kg/m  GENERAL: The patient is AO x3, in no acute distress. HEENT: Head is normocephalic and atraumatic. EOMI are intact. Mouth is well hydrated and without lesions. NECK: Supple. No masses LUNGS: Clear to auscultation. No presence of rhonchi/wheezing/rales. Adequate chest expansion HEART: RRR, normal s1 and s2. ABDOMEN: Soft, nontender, no guarding, no peritoneal signs, and nondistended. BS +. No masses. EXTREMITIES: Without any cyanosis, clubbing, rash, lesions or edema. NEUROLOGIC: AOx3, no focal motor deficit. SKIN: no jaundice, no rashes   Imaging/Labs: as above  I personally reviewed and interpreted the available labs, imaging and endoscopic files.  Impression and Plan: MAXAMILLION BANAS is a 77 y.o. male with PMH jejunal GIST s/p resection, GERD, Barrett's esophagus, hyperlipidemia, hypertension, who presents for evaluation of GERD, Barrett's esophagus and history of colonic polyps.  The patient has presented a chronic history of GERD that has been well-controlled with low-dose PPI, which he should continue taking for now.  Was found to have Barrett's esophagus without dysplasia.  As he is due for repeat colonoscopy next year, we will proceed with both an EGD and colonoscopy in July 2025 but these Reco will be updated).  He will continue with the same dose of PPI daily.  - Continue omeprazole 20 mg qday - Will schedule EGD and colonoscopy in July 2025  All questions were answered.      David Blazing, MD Gastroenterology and Hepatology [facility] Gastroenterology

## 2023-02-12 NOTE — Patient Instructions (Signed)
Continue omeprazole 20 mg qday Will schedule EGD and colonoscopy in July 2025

## 2023-02-21 ENCOUNTER — Ambulatory Visit (INDEPENDENT_AMBULATORY_CARE_PROVIDER_SITE_OTHER): Payer: Medicare HMO

## 2023-02-21 VITALS — Ht 68.0 in | Wt 195.0 lb

## 2023-02-21 DIAGNOSIS — Z0000 Encounter for general adult medical examination without abnormal findings: Secondary | ICD-10-CM | POA: Diagnosis not present

## 2023-02-21 DIAGNOSIS — Z1159 Encounter for screening for other viral diseases: Secondary | ICD-10-CM

## 2023-02-21 NOTE — Patient Instructions (Signed)
David Weiss , Thank you for taking time to come for your Medicare Wellness Visit. I appreciate your ongoing commitment to your health goals. Please review the following plan we discussed and let me know if I can assist you in the future.   These are the goals we discussed:  Goals       Patient Stated (pt-stated)      Go on two cruises        This is a list of the screening recommended for you and due dates:  Health Maintenance  Topic Date Due   Hepatitis C Screening  Never done   Zoster (Shingles) Vaccine (1 of 2) Never done   Pneumonia Vaccine (1 of 1 - PCV) Never done   COVID-19 Vaccine (3 - Pfizer risk series) 10/21/2019   Flu Shot  03/01/2023   Medicare Annual Wellness Visit  02/21/2024   Colon Cancer Screening  02/26/2024   DTaP/Tdap/Td vaccine (3 - Td or Tdap) 02/11/2026   HPV Vaccine  Aged Out    Advanced directives: Advance directive discussed with you today. Even though you declined this today, please call our office should you change your mind, and we can give you the proper paperwork for you to fill out. Advance care planning is a way to make decisions about medical care that fits your values in case you are ever unable to make these decisions for yourself.  Information on Advanced Care Planning can be found at [facility] Advance Health Care Directives Advance Health Care Directives ([email or web address]    Conditions/risks identified:  A Hepatitis C Screening has been ordered for you today. You may have it drawn at your convenience.  You do not have to fast to have this lab drawn.     Next appointment: VIRTUAL/ TELEPHONE VISIT Follow up in one year for your annual wellness visit  March 26, 2024 at    Preventive Care 65 Years and Older, Male  Preventive care refers to lifestyle choices and visits with your health care provider that can promote health and wellness. What does preventive care include? A yearly physical exam. This is also called an  annual well check. Dental exams once or twice a year. Routine eye exams. Ask your health care provider how often you should have your eyes checked. Personal lifestyle choices, including: Daily care of your teeth and gums. Regular physical activity. Eating a healthy diet. Avoiding tobacco and drug use. Limiting alcohol use. Practicing safe sex. Taking low doses of aspirin every day. Taking vitamin and mineral supplements as recommended by your health care provider. What happens during an annual well check? The services and screenings done by your health care provider during your annual well check will depend on your age, overall health, lifestyle risk factors, and family history of disease. Counseling  Your health care provider may ask you questions about your: Alcohol use. Tobacco use. Drug use. Emotional well-being. Home and relationship well-being. Sexual activity. Eating habits. History of falls. Memory and ability to understand (cognition). Work and work Astronomer. Screening  You may have the following tests or measurements: Height, weight, and BMI. Blood pressure. Lipid and cholesterol levels. These may be checked every 5 years, or more frequently if you are over 32 years old. Skin check. Lung cancer screening. You may have this screening every year starting at age 81 if you have a 30-pack-year history of smoking and currently smoke or have quit within the past 15 years. Fecal occult blood test (FOBT)  of the stool. You may have this test every year starting at age 54. Flexible sigmoidoscopy or colonoscopy. You may have a sigmoidoscopy every 5 years or a colonoscopy every 10 years starting at age 59. Prostate cancer screening. Recommendations will vary depending on your family history and other risks. Hepatitis C blood test. Hepatitis B blood test. Sexually transmitted disease (STD) testing. Diabetes screening. This is done by checking your blood sugar (glucose) after you  have not eaten for a while (fasting). You may have this done every 1-3 years. Abdominal aortic aneurysm (AAA) screening. You may need this if you are a current or former smoker. Osteoporosis. You may be screened starting at age 47 if you are at high risk. Talk with your health care provider about your test results, treatment options, and if necessary, the need for more tests. Vaccines  Your health care provider may recommend certain vaccines, such as: Influenza vaccine. This is recommended every year. Tetanus, diphtheria, and acellular pertussis (Tdap, Td) vaccine. You may need a Td booster every 10 years. Zoster vaccine. You may need this after age 84. Pneumococcal 13-valent conjugate (PCV13) vaccine. One dose is recommended after age 68. Pneumococcal polysaccharide (PPSV23) vaccine. One dose is recommended after age 71. Talk to your health care provider about which screenings and vaccines you need and how often you need them. This information is not intended to replace advice given to you by your health care provider. Make sure you discuss any questions you have with your health care provider. Document Released: 08/13/2015 Document Revised: 04/05/2016 Document Reviewed: 05/18/2015 Elsevier Interactive Patient Education  2017 [employer].  Fall Prevention in the Home Falls can cause injuries. They can happen to people of all ages. There are many things you can do to make your home safe and to help prevent falls. What can I do on the outside of my home? Regularly fix the edges of walkways and driveways and fix any cracks. Remove anything that might make you trip as you walk through a door, such as a raised step or threshold. Trim any bushes or trees on the path to your home. Use bright outdoor lighting. Clear any walking paths of anything that might make someone trip, such as rocks or tools. Regularly check to see if handrails are loose or broken. Make sure that both sides of any steps have  handrails. Any raised decks and porches should have guardrails on the edges. Have any leaves, snow, or ice cleared regularly. Use sand or salt on walking paths during winter. Clean up any spills in your garage right away. This includes oil or grease spills. What can I do in the bathroom? Use night lights. Install grab bars by the toilet and in the tub and shower. Do not use towel bars as grab bars. Use non-skid mats or decals in the tub or shower. If you need to sit down in the shower, use a plastic, non-slip stool. Keep the floor dry. Clean up any water that spills on the floor as soon as it happens. Remove soap buildup in the tub or shower regularly. Attach bath mats securely with double-sided non-slip rug tape. Do not have throw rugs and other things on the floor that can make you trip. What can I do in the bedroom? Use night lights. Make sure that you have a light by your bed that is easy to reach. Do not use any sheets or blankets that are too big for your bed. They should not hang down onto  the floor. Have a firm chair that has side arms. You can use this for support while you get dressed. Do not have throw rugs and other things on the floor that can make you trip. What can I do in the kitchen? Clean up any spills right away. Avoid walking on wet floors. Keep items that you use a lot in easy-to-reach places. If you need to reach something above you, use a strong step stool that has a grab bar. Keep electrical cords out of the way. Do not use floor polish or wax that makes floors slippery. If you must use wax, use non-skid floor wax. Do not have throw rugs and other things on the floor that can make you trip. What can I do with my stairs? Do not leave any items on the stairs. Make sure that there are handrails on both sides of the stairs and use them. Fix handrails that are broken or loose. Make sure that handrails are as long as the stairways. Check any carpeting to make sure  that it is firmly attached to the stairs. Fix any carpet that is loose or worn. Avoid having throw rugs at the top or bottom of the stairs. If you do have throw rugs, attach them to the floor with carpet tape. Make sure that you have a light switch at the top of the stairs and the bottom of the stairs. If you do not have them, ask someone to add them for you. What else can I do to help prevent falls? Wear shoes that: Do not have high heels. Have rubber bottoms. Are comfortable and fit you well. Are closed at the toe. Do not wear sandals. If you use a stepladder: Make sure that it is fully opened. Do not climb a closed stepladder. Make sure that both sides of the stepladder are locked into place. Ask someone to hold it for you, if possible. Clearly mark and make sure that you can see: Any grab bars or handrails. First and last steps. Where the edge of each step is. Use tools that help you move around (mobility aids) if they are needed. These include: Canes. Walkers. Scooters. Crutches. Turn on the lights when you go into a dark area. Replace any light bulbs as soon as they burn out. Set up your furniture so you have a clear path. Avoid moving your furniture around. If any of your floors are uneven, fix them. If there are any pets around you, be aware of where they are. Review your medicines with your doctor. Some medicines can make you feel dizzy. This can increase your chance of falling. Ask your doctor what other things that you can do to help prevent falls. This information is not intended to replace advice given to you by your health care provider. Make sure you discuss any questions you have with your health care provider. Document Released: 05/13/2009 Document Revised: 12/23/2015 Document Reviewed: 08/21/2014 Elsevier Interactive Patient Education  2017 [employer].

## 2023-02-21 NOTE — Progress Notes (Signed)
 Because this visit was a virtual/telehealth visit,  certain criteria was not obtained, such a blood pressure, CBG if patient is a diabetic, and timed up and go. Any medications not marked as "taking" was not mentioned during the medication reconciliation part of the visit. Any vitals not documented were not able to be obtained due to this being a telehealth visit. Vitals documented are verbally provided by the patient.   Subjective:   David Weiss is a 77 y.o. male who presents for an Initial Medicare Annual Wellness Visit.  Visit Complete: Virtual  I connected with  David Weiss on 02/21/23 by a audio enabled telemedicine application and verified that I am speaking with the correct person using two identifiers.  Patient Location: Home  Provider Location: Home Office  I discussed the limitations of evaluation and management by telemedicine. The patient expressed understanding and agreed to proceed.  Patient Medicare AWV questionnaire was completed by the patient on n/a; I have confirmed that all information answered by patient is correct and no changes since this date.  Review of Systems     Cardiac Risk Factors include: advanced age (>24men, >60 women);dyslipidemia;male gender;hypertension;sedentary lifestyle     Objective:    Today's Vitals   02/21/23 1451  Weight: 195 lb (88.5 kg)  Height: 5\' 8"  (1.727 m)   Body mass index is 29.65 kg/m.     02/21/2023    2:54 PM 09/01/2021    7:46 AM 03/26/2019   12:10 PM 02/26/2019   10:23 AM 08/30/2016    6:54 AM 02/12/2016   10:33 AM 03/18/2014   10:05 AM  Advanced Directives  Does Patient Have a Medical Advance Directive? No No No Yes No No No  Type of Theme park manager;Living will     Copy of Healthcare Power of Attorney in Chart?    No - copy requested     Would patient like information on creating a medical advance directive? No - Patient declined No - Patient declined No - Patient declined  No -  Patient declined  No - patient declined information    Current Medications (verified) Outpatient Encounter Medications as of 02/21/2023  Medication Sig   amLODipine (NORVASC) 5 MG tablet Take 5 mg by mouth daily.   atorvastatin (LIPITOR) 10 MG tablet Take 10 mg by mouth daily.   benazepril (LOTENSIN) 10 MG tablet Take 1 tablet (10 mg total) by mouth daily.   omeprazole (PRILOSEC) 20 MG capsule Take 20 mg by mouth daily.   OVER THE COUNTER MEDICATION B12 once per day.   Vit E once per day.   No facility-administered encounter medications on file as of 02/21/2023.    Allergies (verified) Penicillins and Pantoprazole sodium   History: Past Medical History:  Diagnosis Date   Anemia    Barrett's esophagus 11/2009   EGD Dr. Edgardo Roys Barrett's, multiple antral/bulbar erosins, D2 erosion sec NSAIDs   Blood transfusion without reported diagnosis    [facility])    Clotting disorder [facility])    Diverticulosis 11/2009   Colonoscopy Dr Jena Gauss   Family hx of colon cancer    GERD (gastroesophageal reflux disease)    Helicobacter pylori gastritis 11/2009   HTN (hypertension)    Hx of adenomatous colonic polyps 02/2009   Colonoscopy Dr. Jena Gauss   Hyperlipemia    Schatzki's ring 11/2009   15F dilator   Past Surgical History:  Procedure Laterality Date   BIOPSY N/A 08/30/2016  Procedure: BIOPSY;  Surgeon: Malissa Hippo, MD;  [facility];  Service: Endoscopy;  Laterality: N/A;  esophageal   BIOPSY  09/01/2021   Procedure: BIOPSY;  Surgeon: Malissa Hippo, MD;  [facility];  Service: Endoscopy;;   CAPSULE ENDOSCOPY OF THE SMALL BOWEL  02/17/2011       COLON SURGERY     COLONOSCOPY  05/19/2011   Procedure: COLONOSCOPY;  Surgeon: Corbin Ade, MD;  [facility];  Service: Endoscopy;  Laterality: N/A;  8:30   COLONOSCOPY N/A 08/21/2013   Procedure: COLONOSCOPY;  Surgeon: Malissa Hippo, MD;  [facility];  Service: Endoscopy;   Laterality: N/A;  930   COLONOSCOPY N/A 02/26/2019   Procedure: COLONOSCOPY;  Surgeon: Malissa Hippo, MD;  [facility];  Service: Endoscopy;  Laterality: N/A;  9:30-rescheduled to 5/20 @ 11:15 per Dewayne Hatch   EAR CYST EXCISION N/A 03/25/2014   Procedure: EXCISION OF NEOPLASM NECK;  Surgeon: Dalia Heading, MD;  Location: [facility];  Service: General;  Laterality: N/A;   ESOPHAGOGASTRODUODENOSCOPY  02/13/2011   Procedure: ESOPHAGOGASTRODUODENOSCOPY (EGD);  Surgeon: Corbin Ade, MD;  [facility];  Service: Endoscopy;  Laterality: N/A;   ESOPHAGOGASTRODUODENOSCOPY N/A 08/30/2016   Procedure: ESOPHAGOGASTRODUODENOSCOPY (EGD);  Surgeon: Malissa Hippo, MD;  [facility];  Service: Endoscopy;  Laterality: N/A;  730   ESOPHAGOGASTRODUODENOSCOPY (EGD) WITH PROPOFOL N/A 09/01/2021   Procedure: ESOPHAGOGASTRODUODENOSCOPY (EGD) WITH PROPOFOL;  Surgeon: Malissa Hippo, MD;  [facility];  Service: Endoscopy;  Laterality: N/A;  910   excisional hernia  01/28/2013   EYE SURGERY     GIVENS CAPSULE STUDY  02/15/2011   Procedure: GIVENS CAPSULE STUDY;  Surgeon: Corbin Ade, MD;  [facility];  Service: Endoscopy;  Laterality: N/A;   HERNIA REPAIR     POLYPECTOMY  02/26/2019   Procedure: POLYPECTOMY;  Surgeon: Malissa Hippo, MD;  [facility];  Service: Endoscopy;;  colon   small intestine partial removal  06/01/2011   due to malignant tumor   VASECTOMY     Family History  Problem Relation Age of Onset   Diabetes Mother    Heart failure Mother    Hearing loss Mother    Heart disease Mother    Prostate cancer Father 65   Cancer Father    Hearing loss Father    Heart disease Father    Cancer Brother    Hearing loss Brother    Colon cancer Brother 62   Hearing loss Sister    Social History   Socioeconomic History   Marital status: Married    Spouse name: Not on file   Number of children: 2   Years of education: Not on  file   Highest education level: Not on file  Occupational History   Occupation: retired    Comment: Airline pilot  Tobacco Use   Smoking status: Former    Current packs/day: 0.00    Average packs/day: 2.0 packs/day for 20.0 years (40.0 ttl pk-yrs)    Types: Cigarettes    Start date: 03/14/1961    Quit date: 03/14/1981    Years since quitting: 41.9   Smokeless tobacco: Never   Tobacco comments:    quit 1982  Vaping Use   Vaping status: Never Used  Substance and Sexual Activity   Alcohol use: Yes    Alcohol/week: 15.0 standard drinks of alcohol    Types: 15  Standard drinks or equivalent per week    Comment: two drinks a day bourbon   Drug use: No   Sexual activity: Yes  Other Topics Concern   Not on file  Social History Narrative   Lives w/ wife   Social Determinants of Health   Financial Resource Strain: Low Risk  (02/21/2023)   Overall Financial Resource Strain (CARDIA)    Difficulty of Paying Living Expenses: Not hard at all  Food Insecurity: No Food Insecurity (02/21/2023)   Hunger Vital Sign    Worried About Running Out of Food in the Last Year: Never true    Ran Out of Food in the Last Year: Never true  Transportation Needs: No Transportation Needs (02/21/2023)   PRAPARE - Administrator, Civil Service (Medical): No    Lack of Transportation (Non-Medical): No  Physical Activity: Insufficiently Active (02/21/2023)   Exercise Vital Sign    Days of Exercise per Week: 7 days    Minutes of Exercise per Session: 20 min  Stress: No Stress Concern Present (02/21/2023)   [employer] of Occupational Health - Occupational Stress Questionnaire    Feeling of Stress : Not at all  Social Connections: Socially Integrated (02/21/2023)   Social Connection and Isolation Panel [NHANES]    Frequency of Communication with Friends and Family: More than three times a week    Frequency of Social Gatherings with Friends and Family: More than three times a week    Attends Religious  Services: More than 4 times per year    Active Member of [employer] or Organizations: Yes    Attends Engineer, structural: More than 4 times per year    Marital Status: Married    Tobacco Counseling Counseling given: Yes Tobacco comments: quit 1982   Clinical Intake:  Pre-visit preparation completed: Yes  Pain : No/denies pain     BMI - recorded: 29.65 Nutritional Status: BMI 25 -29 Overweight Nutritional Risks: None Diabetes: No  How often do you need to have someone help you when you read instructions, pamphlets, or other written materials from your doctor or pharmacy?: 1 - Never  Interpreter Needed?: No  Information entered by ::  Ajamu Maxon, CMA   Activities of Daily Living    02/21/2023    2:52 PM  In your present state of health, do you have any difficulty performing the following activities:  Hearing? 0  Vision? 0  Difficulty concentrating or making decisions? 0  Walking or climbing stairs? 0  Dressing or bathing? 0  Doing errands, shopping? 0  Preparing Food and eating ? N  Using the Toilet? N  In the past six months, have you accidently leaked urine? N  Do you have problems with loss of bowel control? N  Managing your Medications? N  Managing your Finances? N  Housekeeping or managing your Housekeeping? N    Patient Care Team: Billie Lade, MD as PCP - General (Internal Medicine) Malissa Hippo, MD (Inactive) as Consulting Physician (Gastroenterology)  Indicate any recent Medical Services you may have received from other than [facility] providers in the past year (date may be approximate).     Assessment:   This is a routine wellness examination for [place].  Hearing/Vision screen Hearing Screening - Comments:: Patient denies any hearing difficulties.    Dietary issues and exercise activities discussed:     Goals Addressed               This Visit's Progress  Patient Stated (pt-stated)        Go on two cruises        Depression Screen    02/21/2023    2:55 PM 01/31/2023    2:22 PM 12/06/2022    2:32 PM  PHQ 2/9 Scores  PHQ - 2 Score 0 0 0  PHQ- 9 Score 0 0 3    Fall Risk    02/21/2023    2:54 PM 01/31/2023    2:22 PM 12/06/2022    2:31 PM  Fall Risk   Falls in the past year? 0 0 0  Number falls in past yr: 0 0 0  Injury with Fall? 0 0 0  Risk for fall due to : No Fall Risks No Fall Risks No Fall Risks  Follow up Falls prevention discussed Falls evaluation completed Falls evaluation completed    MEDICARE RISK AT HOME:  Medicare Risk at Home - 02/21/23 1454     Any stairs in or around the home? Yes    If so, are there any without handrails? No    Home free of loose throw rugs in walkways, pet beds, electrical cords, etc? Yes    Adequate lighting in your home to reduce risk of falls? Yes    Life alert? No    Use of a cane, walker or w/c? No    Grab bars in the bathroom? Yes    Shower chair or bench in shower? No    Elevated toilet seat or a handicapped toilet? Yes             TIMED UP AND GO:  Was the test performed? No    Cognitive Function:        02/21/2023    2:54 PM  6CIT Screen  What Year? 0 points  What month? 0 points  What time? 0 points  Count back from 20 0 points  Months in reverse 0 points  Repeat phrase 0 points  Total Score 0 points    Immunizations Immunization History  Administered Date(s) Administered   PFIZER(Purple Top)SARS-COV-2 Vaccination 09/02/2019, 09/23/2019   Tdap 06/16/2011, 02/12/2016    TDAP status: Up to date  Flu Vaccine status: Up to date  Pneumococcal vaccine status: Up to date  Covid-19 vaccine status: Information provided on how to obtain vaccines.   Qualifies for Shingles Vaccine? Yes   Patient states he had this given at [facility], Records need to be requested  Screening Tests Health Maintenance  Topic Date Due   Medicare Annual Wellness (AWV)  Never done   Hepatitis C Screening  Never done   Zoster  Vaccines- Shingrix (1 of 2) Never done   Pneumonia Vaccine 6+ Years old (1 of 1 - PCV) Never done   COVID-19 Vaccine (3 - Pfizer risk series) 10/21/2019   INFLUENZA VACCINE  03/01/2023   Colonoscopy  02/26/2024   DTaP/Tdap/Td (3 - Td or Tdap) 02/11/2026   HPV VACCINES  Aged Out    Health Maintenance  Health Maintenance Due  Topic Date Due   Medicare Annual Wellness (AWV)  Never done   Hepatitis C Screening  Never done   Zoster Vaccines- Shingrix (1 of 2) Never done   Pneumonia Vaccine 62+ Years old (1 of 1 - PCV) Never done   COVID-19 Vaccine (3 - Pfizer risk series) 10/21/2019    Colorectal cancer screening: Type of screening: Colonoscopy. Completed 02/26/2019. Repeat every 5 years  Lung Cancer Screening: (Low Dose CT Chest recommended if  Age 22-80 years, 20 pack-year currently smoking OR have quit w/in 15years.) does not qualify.   Additional Screening:  Hepatitis C Screening: does qualify: Order placed 02/21/2023  Vision Screening: Recommended annual ophthalmology exams for early detection of glaucoma and other disorders of the eye. Is the patient up to date with their annual eye exam?  Yes  Who is the provider or what is the name of the office in which the patient attends annual eye exams? Dr. Hilda Blades [place] If pt is not established with a provider, would they like to be referred to a provider to establish care? No .   Dental Screening: Recommended annual dental exams for proper oral hygiene  Diabetic Foot Exam: N/A  Community Resource Referral / Chronic Care Management: CRR required this visit?  No   CCM required this visit?  No    Plan:     I have personally reviewed and noted the following in the patient's chart:   Medical and social history Use of alcohol, tobacco or illicit drugs  Current medications and supplements including opioid prescriptions. Patient is not currently taking opioid prescriptions. Functional ability and status Nutritional  status Physical activity Advanced directives List of other physicians Hospitalizations, surgeries, and ER visits in previous 12 months Vitals Screenings to include cognitive, depression, and falls Referrals and appointments  In addition, I have reviewed and discussed with patient certain preventive protocols, quality metrics, and best practice recommendations. A written personalized care plan for preventive services as well as general preventive health recommendations were provided to patient.     Jordan Hawks Aleigha Gilani, CMA   02/21/2023   After Visit Summary: (MyChart) Due to this being a telephonic visit, the after visit summary with patients personalized plan was offered to patient via MyChart   Nurse Notes: Hep C ordered. Request vaccine records from [facility] patient.

## 2023-03-07 DIAGNOSIS — R69 Illness, unspecified: Secondary | ICD-10-CM | POA: Diagnosis not present

## 2023-04-09 DIAGNOSIS — R69 Illness, unspecified: Secondary | ICD-10-CM | POA: Diagnosis not present

## 2023-07-09 ENCOUNTER — Ambulatory Visit (INDEPENDENT_AMBULATORY_CARE_PROVIDER_SITE_OTHER): Payer: Medicare HMO | Admitting: Internal Medicine

## 2023-07-09 ENCOUNTER — Other Ambulatory Visit (HOSPITAL_BASED_OUTPATIENT_CLINIC_OR_DEPARTMENT_OTHER): Payer: Self-pay

## 2023-07-09 ENCOUNTER — Encounter: Payer: Self-pay | Admitting: Internal Medicine

## 2023-07-09 VITALS — BP 134/66 | HR 62 | Ht 68.0 in | Wt 197.0 lb

## 2023-07-09 DIAGNOSIS — Z1159 Encounter for screening for other viral diseases: Secondary | ICD-10-CM | POA: Diagnosis not present

## 2023-07-09 DIAGNOSIS — Z0001 Encounter for general adult medical examination with abnormal findings: Secondary | ICD-10-CM | POA: Diagnosis not present

## 2023-07-09 DIAGNOSIS — R001 Bradycardia, unspecified: Secondary | ICD-10-CM | POA: Insufficient documentation

## 2023-07-09 DIAGNOSIS — N401 Enlarged prostate with lower urinary tract symptoms: Secondary | ICD-10-CM

## 2023-07-09 DIAGNOSIS — E782 Mixed hyperlipidemia: Secondary | ICD-10-CM

## 2023-07-09 DIAGNOSIS — R351 Nocturia: Secondary | ICD-10-CM | POA: Diagnosis not present

## 2023-07-09 DIAGNOSIS — I10 Essential (primary) hypertension: Secondary | ICD-10-CM | POA: Diagnosis not present

## 2023-07-09 DIAGNOSIS — K2270 Barrett's esophagus without dysplasia: Secondary | ICD-10-CM | POA: Diagnosis not present

## 2023-07-09 MED ORDER — OMEPRAZOLE 20 MG PO CPDR
20.0000 mg | DELAYED_RELEASE_CAPSULE | Freq: Every day | ORAL | 3 refills | Status: AC
Start: 2023-07-09 — End: ?

## 2023-07-09 MED ORDER — ATORVASTATIN CALCIUM 10 MG PO TABS: 10.0000 mg | ORAL_TABLET | Freq: Every day | ORAL | 3 refills | Status: DC

## 2023-07-09 MED ORDER — AREXVY 120 MCG/0.5ML IM SUSR
0.5000 mL | Freq: Once | INTRAMUSCULAR | 0 refills | Status: AC
  Filled 2023-07-09: qty 0.5, 1d supply, fill #0

## 2023-07-09 MED ORDER — BENAZEPRIL HCL 10 MG PO TABS: 10.0000 mg | ORAL_TABLET | Freq: Every day | ORAL | 3 refills | Status: DC

## 2023-07-09 MED ORDER — AMLODIPINE BESYLATE 5 MG PO TABS: 5.0000 mg | ORAL_TABLET | Freq: Every day | ORAL | 3 refills | Status: DC

## 2023-07-09 NOTE — Patient Instructions (Signed)
It was a pleasure to see you today.  Thank you for giving [place] the opportunity to be involved in your care.  Below is a brief recap of your visit and next steps.  We will plan to see you again in 6 months.  Summary Annual physical completed today Repeat labs ordered Medication refills provided We will follow up in 6 months

## 2023-07-09 NOTE — Assessment & Plan Note (Signed)
His acute concern today is bradycardia that occurs overnight.  He is asymptomatic during these episodes and has noted that his heart rate decreases to 40s.  Heart rate during encounter today maintained in 60s.  ECG obtained shows sinus rhythm with PACs.  In the absence of symptoms, no further workup indicated at this time.

## 2023-07-09 NOTE — Assessment & Plan Note (Signed)
He remains on atorvastatin 10 mg daily.  Repeat lipid panel ordered today.

## 2023-07-09 NOTE — Assessment & Plan Note (Signed)
Annual physical exam completed today.  Previous records and labs reviewed. -Repeat labs ordered today, including one-time HCV screening -Due for influenza vaccine.  He plans to receive this as well as RSV vaccination at his pharmacy. -We will tentatively plan for follow-up in 6 months

## 2023-07-09 NOTE — Assessment & Plan Note (Signed)
Remains adequately controlled on current antihypertensive regimen consisting of amlodipine 5 mg daily and benazepril 10 mg daily.  Refills provided today.

## 2023-07-09 NOTE — Assessment & Plan Note (Signed)
Symptoms remain adequately controlled with omeprazole 20 mg daily.  He has established care with gastroenterology (Dr. Levon Hedger).  EGD/colonoscopy planned for July 2025.

## 2023-07-09 NOTE — Progress Notes (Signed)
Complete physical exam  Patient: David Weiss   DOB: 05-10-1946   77 y.o. Male  MRN: [id number]  Subjective:    Chief Complaint  Patient presents with   Follow-up    Follow up, concerned with heart rate at night running in the 40's     David Weiss is a 77 y.o. male who presents today for a complete physical exam. He reports consuming a general diet. Gym/ health club routine includes treadmill. He generally feels well. He reports sleeping fairly well. He does have additional problems to discuss today. He is concerned about bradycardia that occurs overnight with heart rate decreasing to 40's. This does not occur during the day and he is asymptomatic.    Most recent fall risk assessment:    07/09/2023    8:04 AM  Fall Risk   Falls in the past year? 0  Number falls in past yr: 0  Injury with Fall? 0  Risk for fall due to : No Fall Risks  Follow up Falls evaluation completed     Most recent depression screenings:    07/09/2023    8:04 AM 02/21/2023    2:55 PM  PHQ 2/9 Scores  PHQ - 2 Score 0 0  PHQ- 9 Score  0    Vision:Within last year and Dental: No current dental problems and Receives regular dental care  Past Medical History:  Diagnosis Date   Anemia    Barrett's esophagus 11/2009   EGD Dr. Edgardo Roys Barrett's, multiple antral/bulbar erosins, D2 erosion sec NSAIDs   Blood transfusion without reported diagnosis    Cancer (HCC)    Clotting disorder (HCC)    Diverticulosis 11/2009   Colonoscopy Dr Jena Gauss   Family hx of colon cancer    GERD (gastroesophageal reflux disease)    Helicobacter pylori gastritis 11/2009   HTN (hypertension)    Hx of adenomatous colonic polyps 02/2009   Colonoscopy Dr. Jena Gauss   Hyperlipemia    Schatzki's ring 11/2009   5F dilator   Past Surgical History:  Procedure Laterality Date   BIOPSY N/A 08/30/2016   Procedure: BIOPSY;  Surgeon: Malissa Hippo, MD;  [facility];  Service: Endoscopy;  Laterality: N/A;   esophageal   BIOPSY  09/01/2021   Procedure: BIOPSY;  Surgeon: Malissa Hippo, MD;  [facility];  Service: Endoscopy;;   CAPSULE ENDOSCOPY OF THE SMALL BOWEL  02/17/2011       COLON SURGERY     COLONOSCOPY  05/19/2011   Procedure: COLONOSCOPY;  Surgeon: Corbin Ade, MD;  [facility];  Service: Endoscopy;  Laterality: N/A;  8:30   COLONOSCOPY N/A 08/21/2013   Procedure: COLONOSCOPY;  Surgeon: Malissa Hippo, MD;  [facility];  Service: Endoscopy;  Laterality: N/A;  930   COLONOSCOPY N/A 02/26/2019   Procedure: COLONOSCOPY;  Surgeon: Malissa Hippo, MD;  [facility];  Service: Endoscopy;  Laterality: N/A;  9:30-rescheduled to 5/20 @ 11:15 per Dewayne Hatch   EAR CYST EXCISION N/A 03/25/2014   Procedure: EXCISION OF NEOPLASM NECK;  Surgeon: Dalia Heading, MD;  [facility]: [facility];  Service: General;  Laterality: N/A;   ESOPHAGOGASTRODUODENOSCOPY  02/13/2011   Procedure: ESOPHAGOGASTRODUODENOSCOPY (EGD);  Surgeon: Corbin Ade, MD;  [facility];  Service: Endoscopy;  Laterality: N/A;   ESOPHAGOGASTRODUODENOSCOPY N/A 08/30/2016   Procedure: ESOPHAGOGASTRODUODENOSCOPY (EGD);  Surgeon: Malissa Hippo, MD;  [facility];  Service: Endoscopy;  Laterality:  N/A;  730   ESOPHAGOGASTRODUODENOSCOPY (EGD) WITH PROPOFOL N/A 09/01/2021   Procedure: ESOPHAGOGASTRODUODENOSCOPY (EGD) WITH PROPOFOL;  Surgeon: Malissa Hippo, MD;  [facility];  Service: Endoscopy;  Laterality: N/A;  910   excisional hernia  01/28/2013   EYE SURGERY     GIVENS CAPSULE STUDY  02/15/2011   Procedure: GIVENS CAPSULE STUDY;  Surgeon: Corbin Ade, MD;  [facility];  Service: Endoscopy;  Laterality: N/A;   HERNIA REPAIR     POLYPECTOMY  02/26/2019   Procedure: POLYPECTOMY;  Surgeon: Malissa Hippo, MD;  [facility];  Service: Endoscopy;;  colon   small intestine partial removal  06/01/2011   due to malignant tumor    VASECTOMY     Social History   Tobacco Use   Smoking status: Former    Current packs/day: 0.00    Average packs/day: 2.0 packs/day for 20.0 years (40.0 ttl pk-yrs)    Types: Cigarettes    Start date: 03/14/1961    Quit date: 03/14/1981    Years since quitting: 42.3   Smokeless tobacco: Never   Tobacco comments:    quit 1982  Vaping Use   Vaping status: Never Used  Substance Use Topics   Alcohol use: Yes    Alcohol/week: 15.0 standard drinks of alcohol    Types: 15 Standard drinks or equivalent per week    Comment: two drinks a day bourbon   Drug use: No   Family History  Problem Relation Age of Onset   Diabetes Mother    Heart failure Mother    Hearing loss Mother    Heart disease Mother    Prostate cancer Father 48   Cancer Father    Hearing loss Father    Heart disease Father    Cancer Brother    Hearing loss Brother    Colon cancer Brother 41   Hearing loss Sister    Allergies  Allergen Reactions   Penicillins Itching    Did it involve swelling of the face/tongue/throat, SOB, or low BP? Unknown Did it involve sudden or severe rash/hives, skin peeling, or any reaction on the inside of your mouth or nose? Unknown Did you need to seek medical attention at a hospital or doctor's office? Unknown When did it last happen?childhood reaction  If all above answers are "NO", may proceed with cephalosporin use.    Pantoprazole Sodium Itching and Rash      Patient Care Team: Billie Lade, MD as PCP - General (Internal Medicine) Rehman, Joline Maxcy, MD (Inactive) as Consulting Physician (Gastroenterology)   Outpatient Medications Prior to Visit  Medication Sig   OVER THE COUNTER MEDICATION B12 once per day.   Vit E once per day.   [DISCONTINUED] amLODipine (NORVASC) 5 MG tablet Take 5 mg by mouth daily.   [DISCONTINUED] atorvastatin (LIPITOR) 10 MG tablet Take 10 mg by mouth daily.   [DISCONTINUED] benazepril (LOTENSIN) 10 MG tablet Take 1 tablet (10 mg total) by  mouth daily.   [DISCONTINUED] omeprazole (PRILOSEC) 20 MG capsule Take 20 mg by mouth daily.   No facility-administered medications prior to visit.   Review of Systems  Constitutional:  Negative for chills and fever.  HENT:  Negative for sore throat.   Respiratory:  Negative for cough and shortness of breath.   Cardiovascular:  Negative for chest pain, palpitations and leg swelling.  Gastrointestinal:  Negative for abdominal pain, blood in stool, constipation, diarrhea, nausea and vomiting.  Genitourinary:  Negative for dysuria and hematuria.  Musculoskeletal:  Negative for myalgias.  Skin:  Negative for itching and rash.  Neurological:  Negative for dizziness and headaches.  Psychiatric/Behavioral:  Negative for depression and suicidal ideas.       Objective:     BP 134/66   Pulse 62   Ht 5\' 8"  (1.727 m)   Wt 197 lb (89.4 kg)   SpO2 94%   BMI 29.95 kg/m  BP Readings from Last 3 Encounters:  07/09/23 134/66  02/12/23 138/72  12/06/22 138/72   Physical Exam Vitals reviewed.  Constitutional:      General: He is not in acute distress.    Appearance: Normal appearance. He is not ill-appearing.  HENT:     Head: Normocephalic and atraumatic.     Right Ear: External ear normal.     Left Ear: External ear normal.     Nose: Nose normal. No congestion or rhinorrhea.     Mouth/Throat:     Mouth: Mucous membranes are moist.     Pharynx: Oropharynx is clear.  Eyes:     General: No scleral icterus.    Extraocular Movements: Extraocular movements intact.     Conjunctiva/sclera: Conjunctivae normal.     Pupils: Pupils are equal, round, and reactive to light.  Cardiovascular:     Rate and Rhythm: Normal rate and regular rhythm.     Pulses: Normal pulses.     Heart sounds: Normal heart sounds. No murmur heard. Pulmonary:     Effort: Pulmonary effort is normal.     Breath sounds: Normal breath sounds. No wheezing, rhonchi or rales.  Abdominal:     General: Abdomen is flat.  Bowel sounds are normal. There is no distension.     Palpations: Abdomen is soft.     Tenderness: There is no abdominal tenderness.  Musculoskeletal:        General: No swelling or deformity. Normal range of motion.     Cervical back: Normal range of motion.  Skin:    General: Skin is warm and dry.     Capillary Refill: Capillary refill takes less than 2 seconds.  Neurological:     General: No focal deficit present.     Mental Status: He is alert and oriented to person, place, and time.     Motor: No weakness.  Psychiatric:        Mood and Affect: Mood normal.        Behavior: Behavior normal.        Thought Content: Thought content normal.        Assessment & Plan:    Routine Health Maintenance and Physical Exam  Immunization History  Administered Date(s) Administered   PFIZER(Purple Top)SARS-COV-2 Vaccination 09/02/2019, 09/23/2019   Tdap 06/16/2011, 02/12/2016   Zoster Recombinant(Shingrix) 03/27/2022, 06/27/2022    Health Maintenance  Topic Date Due   Hepatitis C Screening  Never done   Pneumonia Vaccine 58+ Years old (1 of 1 - PCV) Never done   COVID-19 Vaccine (3 - Pfizer risk series) 10/21/2019   INFLUENZA VACCINE  03/01/2023   Medicare Annual Wellness (AWV)  02/21/2024   Colonoscopy  02/26/2024   DTaP/Tdap/Td (3 - Td or Tdap) 02/11/2026   Zoster Vaccines- Shingrix  Completed   HPV VACCINES  Aged Out    Discussed health benefits of physical activity, and encouraged him to engage in regular exercise appropriate for his age and condition.  Problem List Items Addressed This Visit  Essential hypertension    Remains adequately controlled on current antihypertensive regimen consisting of amlodipine 5 mg daily and benazepril 10 mg daily.  Refills provided today.      Barrett's esophagus without dysplasia    Symptoms remain adequately controlled with omeprazole 20 mg daily.  He has established care with gastroenterology (Dr. Levon Hedger).  EGD/colonoscopy  planned for July 2025.      Hyperlipidemia    He remains on atorvastatin 10 mg daily.  Repeat lipid panel ordered today.      Encounter for well adult exam with abnormal findings - Primary    Annual physical exam completed today.  Previous records and labs reviewed. -Repeat labs ordered today, including one-time HCV screening -Due for influenza vaccine.  He plans to receive this as well as RSV vaccination at his pharmacy. -We will tentatively plan for follow-up in 6 months      Bradycardia with 41-50 beats per minute    His acute concern today is bradycardia that occurs overnight.  He is asymptomatic during these episodes and has noted that his heart rate decreases to 40s.  Heart rate during encounter today maintained in 60s.  ECG obtained shows sinus rhythm with PACs.  In the absence of symptoms, no further workup indicated at this time.      Return in about 6 months (around 01/07/2024).  Billie Lade, MD

## 2023-07-10 DIAGNOSIS — L720 Epidermal cyst: Secondary | ICD-10-CM | POA: Diagnosis not present

## 2023-07-10 DIAGNOSIS — L812 Freckles: Secondary | ICD-10-CM | POA: Diagnosis not present

## 2023-07-10 DIAGNOSIS — D485 Neoplasm of uncertain behavior of skin: Secondary | ICD-10-CM | POA: Diagnosis not present

## 2023-07-10 DIAGNOSIS — D1801 Hemangioma of skin and subcutaneous tissue: Secondary | ICD-10-CM | POA: Diagnosis not present

## 2023-07-10 DIAGNOSIS — D225 Melanocytic nevi of trunk: Secondary | ICD-10-CM | POA: Diagnosis not present

## 2023-07-10 DIAGNOSIS — L570 Actinic keratosis: Secondary | ICD-10-CM | POA: Diagnosis not present

## 2023-07-10 DIAGNOSIS — L309 Dermatitis, unspecified: Secondary | ICD-10-CM | POA: Diagnosis not present

## 2023-07-10 DIAGNOSIS — L821 Other seborrheic keratosis: Secondary | ICD-10-CM | POA: Diagnosis not present

## 2023-07-10 LAB — TSH+FREE T4
Free T4: 1.08 ng/dL (ref 0.82–1.77)
TSH: 2.66 u[IU]/mL (ref 0.450–4.500)

## 2023-07-10 LAB — CMP14+EGFR
ALT: 32 [IU]/L (ref 0–44)
AST: 28 [IU]/L (ref 0–40)
Albumin: 4.4 g/dL (ref 3.8–4.8)
Alkaline Phosphatase: 85 [IU]/L (ref 44–121)
BUN/Creatinine Ratio: 15 (ref 10–24)
BUN: 13 mg/dL (ref 8–27)
Bilirubin Total: 0.5 mg/dL (ref 0.0–1.2)
CO2: 23 mmol/L (ref 20–29)
Calcium: 9.4 mg/dL (ref 8.6–10.2)
Chloride: 105 mmol/L (ref 96–106)
Creatinine, Ser: 0.86 mg/dL (ref 0.76–1.27)
Globulin, Total: 2.3 g/dL (ref 1.5–4.5)
Glucose: 110 mg/dL — ABNORMAL HIGH (ref 70–99)
Potassium: 4.3 mmol/L (ref 3.5–5.2)
Sodium: 143 mmol/L (ref 134–144)
Total Protein: 6.7 g/dL (ref 6.0–8.5)
eGFR: 88 mL/min/{1.73_m2} (ref >59)

## 2023-07-10 LAB — LIPID PANEL
Chol/HDL Ratio: 2.7 {ratio} (ref 0.0–5.0)
Cholesterol, Total: 155 mg/dL (ref 100–199)
HDL: 57 mg/dL (ref >39)
LDL Chol Calc (NIH): 80 mg/dL (ref 0–99)
Triglycerides: 98 mg/dL (ref 0–149)
VLDL Cholesterol Cal: 18 mg/dL (ref 5–40)

## 2023-07-10 LAB — CBC WITH DIFFERENTIAL/PLATELET
Basophils Absolute: 0 10*3/uL (ref 0.0–0.2)
Basos: 1 %
EOS (ABSOLUTE): 0.1 10*3/uL (ref 0.0–0.4)
Eos: 3 %
Hematocrit: 46 % (ref 37.5–51.0)
Hemoglobin: 15.2 g/dL (ref 13.0–17.7)
Immature Grans (Abs): 0 10*3/uL (ref 0.0–0.1)
Immature Granulocytes: 1 %
Lymphocytes Absolute: 1.1 10*3/uL (ref 0.7–3.1)
Lymphs: 26 %
MCH: 30.6 pg (ref 26.6–33.0)
MCHC: 33 g/dL (ref 31.5–35.7)
MCV: 93 fL (ref 79–97)
Monocytes Absolute: 0.5 10*3/uL (ref 0.1–0.9)
Monocytes: 12 %
Neutrophils Absolute: 2.5 10*3/uL (ref 1.4–7.0)
Neutrophils: 57 %
Platelets: 195 10*3/uL (ref 150–450)
RBC: 4.97 x10E6/uL (ref 4.14–5.80)
RDW: 12.2 % (ref 11.6–15.4)
WBC: 4.3 10*3/uL (ref 3.4–10.8)

## 2023-07-10 LAB — HCV AB W REFLEX TO QUANT PCR: HCV Ab: NONREACTIVE

## 2023-07-10 LAB — VITAMIN D 25 HYDROXY (VIT D DEFICIENCY, FRACTURES): Vit D, 25-Hydroxy: 29.8 ng/mL — ABNORMAL LOW (ref 30.0–100.0)

## 2023-07-10 LAB — B12 AND FOLATE PANEL
Folate: 4.2 ng/mL (ref >3.0)
Vitamin B-12: 306 pg/mL (ref 232–1245)

## 2023-07-10 LAB — HEMOGLOBIN A1C
Est. average glucose Bld gHb Est-mCnc: 120 mg/dL
Hgb A1c MFr Bld: 5.8 % — ABNORMAL HIGH (ref 4.8–5.6)

## 2023-07-10 LAB — HCV INTERPRETATION

## 2023-07-10 LAB — PSA: Prostate Specific Ag, Serum: 2.2 ng/mL (ref 0.0–4.0)

## 2024-01-07 ENCOUNTER — Ambulatory Visit: Payer: Medicare HMO | Admitting: Internal Medicine

## 2024-01-08 ENCOUNTER — Ambulatory Visit: Admitting: Internal Medicine

## 2024-01-08 ENCOUNTER — Encounter: Payer: Self-pay | Admitting: Internal Medicine

## 2024-01-08 VITALS — BP 134/75 | HR 73 | Ht 68.0 in | Wt 196.0 lb

## 2024-01-08 DIAGNOSIS — S161XXA Strain of muscle, fascia and tendon at neck level, initial encounter: Secondary | ICD-10-CM | POA: Diagnosis not present

## 2024-01-08 DIAGNOSIS — I10 Essential (primary) hypertension: Secondary | ICD-10-CM | POA: Diagnosis not present

## 2024-01-08 DIAGNOSIS — R351 Nocturia: Secondary | ICD-10-CM | POA: Diagnosis not present

## 2024-01-08 DIAGNOSIS — R7303 Prediabetes: Secondary | ICD-10-CM

## 2024-01-08 DIAGNOSIS — N401 Enlarged prostate with lower urinary tract symptoms: Secondary | ICD-10-CM

## 2024-01-08 DIAGNOSIS — K2270 Barrett's esophagus without dysplasia: Secondary | ICD-10-CM

## 2024-01-08 DIAGNOSIS — E782 Mixed hyperlipidemia: Secondary | ICD-10-CM | POA: Diagnosis not present

## 2024-01-08 MED ORDER — CYCLOBENZAPRINE HCL 5 MG PO TABS: 5.0000 mg | ORAL_TABLET | Freq: Three times a day (TID) | ORAL | 1 refills | Status: DC | PRN

## 2024-01-08 MED ORDER — TAMSULOSIN HCL 0.4 MG PO CAPS: 0.4000 mg | ORAL_CAPSULE | Freq: Every day | ORAL | 3 refills | Status: DC

## 2024-01-08 NOTE — Patient Instructions (Signed)
 It was a pleasure to see you today.  Thank you for giving us  the opportunity to be involved in your care.  Below is a brief recap of your visit and next steps.  We will plan to see you again in 6 months.  Summary Add Flomax  to help with urinary symptoms Flexeril for as needed neck pain relief. See attached exercises. Follow up in 6 months

## 2024-01-08 NOTE — Progress Notes (Unsigned)
 Established Patient Office Visit  Subjective   Patient ID: David Weiss, male    DOB: 04/16/1946  Age: 78 y.o. MRN: [id number]  Chief Complaint  Patient presents with   Care Management    Follow up    Neck Pain    Neck pain with stiffness   David Weiss returns to care today for routine follow-up.  He was last evaluated by me in December 2024 for his annual physical.  No medication changes were made that time, repeat labs ordered, and 34-month follow-up arranged.  There have been no acute interval events.  Past Medical History:  Diagnosis Date   Anemia    Barrett's esophagus 11/2009   EGD Dr. Wylie Heard Barrett's, multiple antral/bulbar erosins, D2 erosion sec NSAIDs   Blood transfusion without reported diagnosis    Cancer [facility])    Clotting disorder [facility])    Diverticulosis 11/2009   Colonoscopy Dr Riley Cheadle   Family hx of colon cancer    GERD (gastroesophageal reflux disease)    Helicobacter pylori gastritis 11/2009   HTN (hypertension)    Hx of adenomatous colonic polyps 02/2009   Colonoscopy Dr. Riley Cheadle   Hyperlipemia    Schatzki's ring 11/2009   87F dilator   Past Surgical History:  Procedure Laterality Date   BIOPSY N/A 08/30/2016   Procedure: BIOPSY;  Surgeon: Ruby Corporal, MD;  [facility];  Service: Endoscopy;  Laterality: N/A;  esophageal   BIOPSY  09/01/2021   Procedure: BIOPSY;  Surgeon: Ruby Corporal, MD;  [facility];  Service: Endoscopy;;   CAPSULE ENDOSCOPY OF THE SMALL BOWEL  02/17/2011       COLON SURGERY     COLONOSCOPY  05/19/2011   Procedure: COLONOSCOPY;  Surgeon: Suzette Espy, MD;  [facility];  Service: Endoscopy;  Laterality: N/A;  8:30   COLONOSCOPY N/A 08/21/2013   Procedure: COLONOSCOPY;  Surgeon: Ruby Corporal, MD;  [facility];  Service: Endoscopy;  Laterality: N/A;  930   COLONOSCOPY N/A 02/26/2019   Procedure: COLONOSCOPY;  Surgeon: Ruby Corporal, MD;  [facility];   Service: Endoscopy;  Laterality: N/A;  9:30-rescheduled to 5/20 @ 11:15 per Lorelee Roger   EAR CYST EXCISION N/A 03/25/2014   Procedure: EXCISION OF NEOPLASM NECK;  Surgeon: Beau Bound, MD;  [facility];  Service: General;  Laterality: N/A;   ESOPHAGOGASTRODUODENOSCOPY  02/13/2011   Procedure: ESOPHAGOGASTRODUODENOSCOPY (EGD);  Surgeon: Suzette Espy, MD;  [facility];  Service: Endoscopy;  Laterality: N/A;   ESOPHAGOGASTRODUODENOSCOPY N/A 08/30/2016   Procedure: ESOPHAGOGASTRODUODENOSCOPY (EGD);  Surgeon: Ruby Corporal, MD;  [facility];  Service: Endoscopy;  Laterality: N/A;  730   ESOPHAGOGASTRODUODENOSCOPY (EGD) WITH PROPOFOL  N/A 09/01/2021   Procedure: ESOPHAGOGASTRODUODENOSCOPY (EGD) WITH PROPOFOL ;  Surgeon: Ruby Corporal, MD;  [facility];  Service: Endoscopy;  Laterality: N/A;  910   excisional hernia  01/28/2013   EYE SURGERY     GIVENS CAPSULE STUDY  02/15/2011   Procedure: GIVENS CAPSULE STUDY;  Surgeon: Suzette Espy, MD;  [facility];  Service: Endoscopy;  Laterality: N/A;   HERNIA REPAIR     POLYPECTOMY  02/26/2019   Procedure: POLYPECTOMY;  Surgeon: Ruby Corporal, MD;  [facility];  Service: Endoscopy;;  colon   small intestine partial removal  06/01/2011   due to malignant tumor   VASECTOMY     Social History  Tobacco Use   Smoking status: Former    Current packs/day: 0.00    Average packs/day: 2.0 packs/day for 20.0 years (40.0 ttl pk-yrs)    Types: Cigarettes    Start date: 03/14/1961    Quit date: 03/14/1981    Years since quitting: 42.8   Smokeless tobacco: Never   Tobacco comments:    quit 1982  Vaping Use   Vaping status: Never Used  Substance Use Topics   Alcohol  use: Yes    Alcohol /week: 15.0 standard drinks of alcohol     Types: 15 Standard drinks or equivalent per week    Comment: two drinks a day bourbon   Drug use: No   Family History  Problem Relation Age of Onset   Diabetes  Mother    Heart failure Mother    Hearing loss Mother    Heart disease Mother    Prostate cancer Father 6   Cancer Father    Hearing loss Father    Heart disease Father    Cancer Brother    Hearing loss Brother    Colon cancer Brother 62   Hearing loss Sister    Allergies  Allergen Reactions   Penicillins Itching    Did it involve swelling of the face/tongue/throat, SOB, or low BP? Unknown Did it involve sudden or severe rash/hives, skin peeling, or any reaction on the inside of your mouth or nose? Unknown Did you need to seek medical attention at a hospital or doctor's office? Unknown When did it last happen?childhood reaction  If all above answers are "NO", may proceed with cephalosporin use.    Pantoprazole Sodium Itching and Rash   ROS    Objective:     BP 134/75   Pulse 73   Ht 5\' 8"  (1.727 m)   Wt 196 lb (88.9 kg)   SpO2 96%   BMI 29.80 kg/m  BP Readings from Last 3 Encounters:  01/08/24 134/75  07/09/23 134/66  02/12/23 138/72   Physical Exam  Last CBC Lab Results  Component Value Date   WBC 4.3 07/09/2023   HGB 15.2 07/09/2023   HCT 46.0 07/09/2023   MCV 93 07/09/2023   MCH 30.6 07/09/2023   RDW 12.2 07/09/2023   PLT 195 07/09/2023   Last metabolic panel Lab Results  Component Value Date   GLUCOSE 110 (H) 07/09/2023   NA 143 07/09/2023   K 4.3 07/09/2023   CL 105 07/09/2023   CO2 23 07/09/2023   BUN 13 07/09/2023   CREATININE 0.86 07/09/2023   EGFR 88 07/09/2023   CALCIUM  9.4 07/09/2023   PROT 6.7 07/09/2023   ALBUMIN 4.4 07/09/2023   LABGLOB 2.3 07/09/2023   BILITOT 0.5 07/09/2023   ALKPHOS 85 07/09/2023   AST 28 07/09/2023   ALT 32 07/09/2023   ANIONGAP 11 03/18/2014   Last lipids Lab Results  Component Value Date   CHOL 155 07/09/2023   HDL 57 07/09/2023   LDLCALC 80 07/09/2023   TRIG 98 07/09/2023   CHOLHDL 2.7 07/09/2023   Last hemoglobin A1c Lab Results  Component Value Date   HGBA1C 5.8 (H) 07/09/2023   Last  thyroid functions Lab Results  Component Value Date   TSH 2.660 07/09/2023   Last vitamin D  Lab Results  Component Value Date   VD25OH 29.8 (L) 07/09/2023   Last vitamin B12 and Folate Lab Results  Component Value Date   VITAMINB12 306 07/09/2023   FOLATE 4.2 07/09/2023     Assessment & Plan:  Problem List Items Addressed This Visit   None   No follow-ups on file.    Tobi Fortes, MD

## 2024-01-09 DIAGNOSIS — R7303 Prediabetes: Secondary | ICD-10-CM | POA: Insufficient documentation

## 2024-01-09 DIAGNOSIS — S161XXA Strain of muscle, fascia and tendon at neck level, initial encounter: Secondary | ICD-10-CM | POA: Insufficient documentation

## 2024-01-09 NOTE — Assessment & Plan Note (Signed)
 His acute concern today remains nocturia.  PSA within normal limits when checked in December.  Flomax  has previously been prescribed for symptomatic relief but he never started taking it.  New prescription for Flomax  sent today.

## 2024-01-09 NOTE — Assessment & Plan Note (Signed)
 A1c 5.8 on labs from December.  We discussed the need to focus on dietary changes in an effort to lower his blood sugar.

## 2024-01-09 NOTE — Assessment & Plan Note (Signed)
 His acute concern today is right sided neck discomfort that has been intermittently present for multiple weeks.  Denies upper extremity numbness/weakness.  ROM of the neck is limited in general secondary to pain, flexion and lateral movements to the right most prominently.  There is tenderness to palpation of the right trapezius.  Suspect a strain of the cervical portion of the right trapezius.  Treatment options reviewed.  Flexeril added for as needed pain relief.  He has also been provided with home PT exercises.

## 2024-01-09 NOTE — Assessment & Plan Note (Signed)
 Symptoms remain well-controlled with omeprazole  20 mg daily.  GI follow-up is scheduled for next month (7/15).

## 2024-01-09 NOTE — Assessment & Plan Note (Signed)
 Remains adequately controlled on current antihypertensive regimen.

## 2024-01-09 NOTE — Assessment & Plan Note (Signed)
 Lipid panel updated in December reflects adequate control with atorvastatin  10 mg daily.

## 2024-02-12 ENCOUNTER — Encounter (INDEPENDENT_AMBULATORY_CARE_PROVIDER_SITE_OTHER): Payer: Self-pay | Admitting: Gastroenterology

## 2024-02-12 ENCOUNTER — Ambulatory Visit (INDEPENDENT_AMBULATORY_CARE_PROVIDER_SITE_OTHER): Payer: Medicare HMO | Admitting: Gastroenterology

## 2024-02-12 VITALS — BP 149/71 | HR 57 | Temp 97.8°F | Ht 68.0 in | Wt 196.3 lb

## 2024-02-12 DIAGNOSIS — Z860101 Personal history of adenomatous and serrated colon polyps: Secondary | ICD-10-CM | POA: Diagnosis not present

## 2024-02-12 DIAGNOSIS — Z860100 Personal history of colon polyps, unspecified: Secondary | ICD-10-CM

## 2024-02-12 DIAGNOSIS — K2270 Barrett's esophagus without dysplasia: Secondary | ICD-10-CM

## 2024-02-12 DIAGNOSIS — K219 Gastro-esophageal reflux disease without esophagitis: Secondary | ICD-10-CM | POA: Diagnosis not present

## 2024-02-12 NOTE — Patient Instructions (Signed)
 It was so nice to see you today!! -continue omeprazole  20mg  daily -be mindful of greasy, spicy, fried, citrus foods, caffeine, carbonated drinks, chocolate and alcohol  as these can increase reflux symptoms -Stay upright 2-3 hours after eating, prior to lying down and avoid eating late in the evenings. -schedule EGD and Colonoscopy   Follow up 1 year  It was a pleasure to provide care to you today. I want to create trusting relationships with patients and provide genuine, compassionate, and quality care. I truly value your feedback! please be on the lookout for a survey regarding your visit with me today. I appreciate your input about our visit and your time in completing this!    Takara Sermons L. Caulin Begley, MSN, APRN, AGNP-C Adult-Gerontology Nurse Practitioner [facility] Gastroenterology at [facility]

## 2024-02-12 NOTE — Progress Notes (Signed)
 Referring Provider: Melvenia Manus BRAVO, MD Primary Care Physician:  Bevely Doffing, FNP Primary GI Physician: Dr. Eartha   Chief Complaint  Patient presents with   Follow-up    Patient here today for a follow up visit on Barrett's esophagus. Patient denies any current issues. He is still taking omeprazole  20 mg daily.    HPI:   David Weiss is a 78 y.o. male with past medical history of  jejunal GIST s/p resection, GERD, Barrett's esophagus, hyperlipidemia, hypertension   Patient presenting today for:  Follow up of GERD with Barrett's esophagus  Last seen July 2024, by Dr. Eartha, at that time, doing well, having heartburn maybe 1-2 times per year, controlled on omeprazole  20mg  daily  Recommended to continue with omeprazole  20mg  daily, EGD and Colonoscopy July 2025  Present:  Doing well on omeprazole  20mg  daily, doing well on this without breakthrough. Denies dysphagia, odynophagia. He has no GI complaints today. No red flag symptoms. Patient denies melena, hematochezia, nausea, vomiting, diarrhea, constipation, dysphagia, odyonophagia, early satiety or weight loss. He is amenable to getting EGD and Colonoscopy scheduled, as discussed at last visit.    Last EGD:09/01/2021 - Normal hypopharynx. - Normal proximal esophagus and mid esophagus. - Esophageal mucosal changes secondary to established short- segment Barrett' s disease. Biopsied. - Z- line regular, 38 cm from the incisors. - 2 cm hiatal hernia. - Normal stomach. - Normal duodenal bulb and second portion of the duodenum. Path: A. ESOPHAGUS, DISTAL, BIOPSY:  - Barretts esophagus, negative for dysplasia    Last Colonoscopy:01/2019 - Three small polyps at the splenic flexure and in the ascending colon, removed with a cold snare. Resected and retrieved. - Two small polyps at the hepatic flexure. Biopsied. - Diverticulosis in the sigmoid colon. Path: tubular adenomas x4   Recommended repeat TCS July 2025/EGD July  2025  Filed Weights   02/12/24 1047  Weight: 196 lb 4.8 oz (89 kg)     Past Medical History:  Diagnosis Date   Anemia    Barrett's esophagus 11/2009   EGD Dr. Merrie Barrett's, multiple antral/bulbar erosins, D2 erosion sec NSAIDs   Blood transfusion without reported diagnosis    Cancer (HCC)    Clotting disorder (HCC)    Diverticulosis 11/2009   Colonoscopy Dr Shaaron   Family hx of colon cancer    GERD (gastroesophageal reflux disease)    Helicobacter pylori gastritis 11/2009   HTN (hypertension)    Hx of adenomatous colonic polyps 02/2009   Colonoscopy Dr. Shaaron   Hyperlipemia    Schatzki's ring 11/2009   59F dilator    Past Surgical History:  Procedure Laterality Date   BIOPSY N/A 08/30/2016   Procedure: BIOPSY;  Surgeon: Claudis RAYMOND Rivet, MD;  [facility];  Service: Endoscopy;  Laterality: N/A;  esophageal   BIOPSY  09/01/2021   Procedure: BIOPSY;  Surgeon: Rivet Claudis RAYMOND, MD;  Location: [facility];  Service: Endoscopy;;   CAPSULE ENDOSCOPY OF THE SMALL BOWEL  02/17/2011       COLON SURGERY     COLONOSCOPY  05/19/2011   Procedure: COLONOSCOPY;  Surgeon: Lamar CHRISTELLA Shaaron, MD;  [facility]: [facility];  Service: Endoscopy;  Laterality: N/A;  8:30   COLONOSCOPY N/A 08/21/2013   Procedure: COLONOSCOPY;  Surgeon: Claudis RAYMOND Rivet, MD;  [facility]: [facility];  Service: Endoscopy;  Laterality: N/A;  930   COLONOSCOPY N/A 02/26/2019   Procedure: COLONOSCOPY;  Surgeon: Rivet Claudis RAYMOND, MD;  [facility];  Service: Endoscopy;  Laterality: N/A;  9:30-rescheduled to 5/20 @ 11:15 per Jenkins   EAR CYST EXCISION N/A 03/25/2014   Procedure: EXCISION OF NEOPLASM NECK;  Surgeon: Oneil DELENA Budge, MD;  Location: [facility];  Service: General;  Laterality: N/A;   ESOPHAGOGASTRODUODENOSCOPY  02/13/2011   Procedure: ESOPHAGOGASTRODUODENOSCOPY (EGD);  Surgeon: Lamar CHRISTELLA Hollingshead, MD;  [facility]: [facility];  Service: Endoscopy;  Laterality: N/A;    ESOPHAGOGASTRODUODENOSCOPY N/A 08/30/2016   Procedure: ESOPHAGOGASTRODUODENOSCOPY (EGD);  Surgeon: Claudis RAYMOND Rivet, MD;  [facility];  Service: Endoscopy;  Laterality: N/A;  730   ESOPHAGOGASTRODUODENOSCOPY (EGD) WITH PROPOFOL  N/A 09/01/2021   Procedure: ESOPHAGOGASTRODUODENOSCOPY (EGD) WITH PROPOFOL ;  Surgeon: Rivet Claudis RAYMOND, MD;  [facility];  Service: Endoscopy;  Laterality: N/A;  910   excisional hernia  01/28/2013   EYE SURGERY     GIVENS CAPSULE STUDY  02/15/2011   Procedure: GIVENS CAPSULE STUDY;  Surgeon: Lamar CHRISTELLA Hollingshead, MD;  [facility]: [facility];  Service: Endoscopy;  Laterality: N/A;   HERNIA REPAIR     POLYPECTOMY  02/26/2019   Procedure: POLYPECTOMY;  Surgeon: Rivet Claudis RAYMOND, MD;  [facility]: [facility];  Service: Endoscopy;;  colon   small intestine partial removal  06/01/2011   due to malignant tumor   VASECTOMY      Current Outpatient Medications  Medication Sig Dispense Refill   amLODipine  (NORVASC ) 5 MG tablet Take 1 tablet (5 mg total) by mouth daily. 90 tablet 3   atorvastatin  (LIPITOR) 10 MG tablet Take 1 tablet (10 mg total) by mouth daily. 90 tablet 3   benazepril  (LOTENSIN ) 10 MG tablet Take 1 tablet (10 mg total) by mouth daily. 90 tablet 3   cyclobenzaprine  (FLEXERIL ) 5 MG tablet Take 1 tablet (5 mg total) by mouth 3 (three) times daily as needed for muscle spasms. 30 tablet 1   omeprazole  (PRILOSEC) 20 MG capsule Take 1 capsule (20 mg total) by mouth daily. 90 capsule 3   tamsulosin  (FLOMAX ) 0.4 MG CAPS capsule Take 1 capsule (0.4 mg total) by mouth daily. 30 capsule 3   No current facility-administered medications for this visit.    Allergies as of 02/12/2024 - Review Complete 02/12/2024  Allergen Reaction Noted   Penicillins Itching 05/05/2011   Pantoprazole sodium Itching and Rash 02/14/2010    Social History   Socioeconomic History   Marital status: Married    Spouse name: Not on file   Number of children: 2   Years  of education: Not on file   Highest education level: Not on file  Occupational History   Occupation: retired    Comment: Airline pilot  Tobacco Use   Smoking status: Former    Current packs/day: 0.00    Average packs/day: 2.0 packs/day for 20.0 years (40.0 ttl pk-yrs)    Types: Cigarettes    Start date: 03/14/1961    Quit date: 03/14/1981    Years since quitting: 42.9   Smokeless tobacco: Never   Tobacco comments:    quit 1982  Vaping Use   Vaping status: Never Used  Substance and Sexual Activity   Alcohol  use: Yes    Alcohol /week: 15.0 standard drinks of alcohol     Types: 15 Standard drinks or equivalent per week    Comment: two drinks a day bourbon   Drug use: No   Sexual activity: Yes  Other Topics Concern   Not on file  Social History Narrative   Lives w/ wife   Social  Drivers of Health   Financial Resource Strain: Low Risk  (02/21/2023)   Overall Financial Resource Strain (CARDIA)    Difficulty of Paying Living Expenses: Not hard at all  Food Insecurity: No Food Insecurity (02/21/2023)   Hunger Vital Sign    Worried About Running Out of Food in the Last Year: Never true    Ran Out of Food in the Last Year: Never true  Transportation Needs: No Transportation Needs (02/21/2023)   PRAPARE - Administrator, Civil Service (Medical): No    Lack of Transportation (Non-Medical): No  Physical Activity: Insufficiently Active (02/21/2023)   Exercise Vital Sign    Days of Exercise per Week: 7 days    Minutes of Exercise per Session: 20 min  Stress: No Stress Concern Present (02/21/2023)   [employer] of Occupational Health - Occupational Stress Questionnaire    Feeling of Stress : Not at all  Social Connections: Socially Integrated (02/21/2023)   Social Connection and Isolation Panel    Frequency of Communication with Friends and Family: More than three times a week    Frequency of Social Gatherings with Friends and Family: More than three times a week    Attends  Religious Services: More than 4 times per year    Active Member of [employer] or Organizations: Yes    Attends Engineer, structural: More than 4 times per year    Marital Status: Married    Review of systems General: negative for malaise, night sweats, fever, chills, weight loss Neck: Negative for lumps, goiter, pain and significant neck swelling Resp: Negative for cough, wheezing, dyspnea at rest CV: Negative for chest pain, leg swelling, palpitations, orthopnea GI: denies melena, hematochezia, nausea, vomiting, diarrhea, constipation, dysphagia, odyonophagia, early satiety or unintentional weight loss.  MSK: Negative for joint pain or swelling, back pain, and muscle pain. Derm: Negative for itching or rash Psych: Denies depression, anxiety, memory loss, confusion. No homicidal or suicidal ideation.  Heme: Negative for prolonged bleeding, bruising easily, and swollen nodes. Endocrine: Negative for cold or heat intolerance, polyuria, polydipsia and goiter. Neuro: negative for tremor, gait imbalance, syncope and seizures. The remainder of the review of systems is noncontributory.  Physical Exam: BP (!) 149/71 (BP Location: Left Arm, Patient Position: Sitting, Cuff Size: Large)   Pulse (!) 57   Temp 97.8 F (36.6 C) (Temporal)   Ht 5' 8 (1.727 m)   Wt 196 lb 4.8 oz (89 kg)   BMI 29.85 kg/m  General:   Alert and oriented. No distress noted. Pleasant and cooperative.  Head:  Normocephalic and atraumatic. Eyes:  Conjuctiva clear without scleral icterus. Mouth:  Oral mucosa pink and moist. Good dentition. No lesions. Heart: Normal rate and rhythm, s1 and s2 heart sounds present.  Lungs: Clear lung sounds in all lobes. Respirations equal and unlabored. Abdomen:  +BS, soft, non-tender and non-distended. No rebound or guarding. No HSM or masses noted. Derm: No palmar erythema or jaundice Msk:  Symmetrical without gross deformities. Normal posture. Extremities:  Without  edema. Neurologic:  Alert and  oriented x4 Psych:  Alert and cooperative. Normal mood and affect.  Invalid input(s): 6 MONTHS   ASSESSMENT: David Weiss is a 78 y.o. male presenting today for follow up of GERD, Barrett's esophagus and history of colonic tubular adenomas.  GERD/BE: well controlled on omeprazole  20mg  daily without breakthrough, dysphagia, odynophagia. Last EGD in 2023 with BE, without dysplasia, he was recommended to have repeat EGD this July, he is  agreeable to scheduling this. Will continue with daily low dose PPI and good reflux precautions.  History of Colonic tubular adenomas: last TCS in 2020 with presence of TAs x4, recommended to have repeat colonoscopy in 5 years.   Indications, risks and benefits of procedure discussed in detail with patient. Patient verbalized understanding and is in agreement to proceed with EGD and Colonoscopy.   PLAN:  -continue omeprazole  20mg  daily -schedule EGD and Colonoscopy ASA III -good reflux precautions  All questions were answered, patient verbalized understanding and is in agreement with plan as outlined above.   Follow Up: 1 year   David Stukey L. Mariette, MSN, APRN, AGNP-C Adult-Gerontology Nurse Practitioner [facility]  I have reviewed the note and agree with the APP's assessment as described in this progress note  Toribio Fortune, MD Gastroenterology and Hepatology [facility]

## 2024-02-13 ENCOUNTER — Encounter: Payer: Self-pay | Admitting: *Deleted

## 2024-02-13 ENCOUNTER — Other Ambulatory Visit: Payer: Self-pay | Admitting: *Deleted

## 2024-02-13 MED ORDER — PEG 3350-KCL-NA BICARB-NACL 420 G PO SOLR: 4000.0000 mL | Freq: Once | ORAL | 0 refills | Status: AC

## 2024-02-18 DIAGNOSIS — L570 Actinic keratosis: Secondary | ICD-10-CM | POA: Diagnosis not present

## 2024-02-18 DIAGNOSIS — L309 Dermatitis, unspecified: Secondary | ICD-10-CM | POA: Diagnosis not present

## 2024-02-26 ENCOUNTER — Ambulatory Visit: Payer: Medicare HMO

## 2024-03-10 NOTE — Patient Instructions (Signed)
 David Weiss  03/10/2024     @PREFPERIOPPHARMACY @   Your procedure is scheduled on  03/18/2024.   Report to [facility] at  0600 A.M.   Call this number if you have problems the morning of surgery:  [phone]  If you experience any cold or flu symptoms such as cough, fever, chills, shortness of breath, etc. between now and your scheduled surgery, please notify us  at the above number.   Remember:  Follow the diet and prep instructions given to you by the office.    You may drink clear liquids until 0330 am on 03/18/2024.    Clear liquids allowed are:                    Water , Juice (No red color; non-citric and without pulp; diabetics please choose diet or no sugar options), Carbonated beverages (diabetics please choose diet or no sugar options), Clear Tea (No creamer, milk, or cream, including half & half and powdered creamer), Black Coffee Only (No creamer, milk or cream, including half & half and powdered creamer), and Clear Sports drink (No red color; diabetics please choose diet or no sugar options)    Take these medicines the morning of surgery with A SIP OF WATER       amlodipine , cyclobenzaprine (if needed), omeprazole , tamsulosin .    Do not wear jewelry, make-up or nail polish, including gel polish,  artificial nails, or any other type of covering on natural nails (fingers and  toes).  Do not wear lotions, powders, or perfumes, or deodorant.  Do not shave 48 hours prior to surgery.  Men may shave face and neck.  Do not bring valuables to the hospital.  [facility] is not responsible for any belongings or valuables.  Contacts, dentures or bridgework may not be worn into surgery.  Leave your suitcase in the car.  After surgery it may be brought to your room.  For patients admitted to the hospital, discharge time will be determined by your treatment team.  Patients discharged the day of surgery will not be allowed to drive home and must have someone with them  for 24 hours.    Special instructions:   DO NOT smoke tobacco or vape for 24 hours before your procedure.  Please read over the following fact sheets that you were given. Anesthesia Post-op Instructions and Care and Recovery After Surgery      Upper Endoscopy, Adult, Care After After the procedure, it is common to have a sore throat. It is also common to have: Mild stomach pain or discomfort. Bloating. Nausea. Follow these instructions at home: The instructions below may help you care for yourself at home. Your health care provider may give you more instructions. If you have questions, ask your health care provider. If you were given a sedative during the procedure, it can affect you for several hours. Do not drive or operate machinery until your health care provider says that it is safe. If you will be going home right after the procedure, plan to have a responsible adult: Take you home from the hospital or clinic. You will not be allowed to drive. Care for you for the time you are told. Follow instructions from your health care provider about what you may eat and drink. Return to your normal activities as told by your health care provider. Ask your health care provider what activities are safe for you. Take over-the-counter and prescription medicines only as  told by your health care provider. Contact a health care provider if you: Have a sore throat that lasts longer than one day. Have trouble swallowing. Have a fever. Get help right away if you: Vomit blood or your vomit looks like coffee grounds. Have bloody, black, or tarry stools. Have a very bad sore throat or you cannot swallow. Have difficulty breathing or very bad pain in your chest or abdomen. These symptoms may be an emergency. Get help right away. Call 911. Do not wait to see if the symptoms will go away. Do not drive yourself to the hospital. Summary After the procedure, it is common to have a sore throat, mild  stomach discomfort, bloating, and nausea. If you were given a sedative during the procedure, it can affect you for several hours. Do not drive until your health care provider says that it is safe. Follow instructions from your health care provider about what you may eat and drink. Return to your normal activities as told by your health care provider. This information is not intended to replace advice given to you by your health care provider. Make sure you discuss any questions you have with your health care provider. Document Revised: 10/26/2021 Document Reviewed: 10/26/2021 Elsevier Patient Education  2024 Elsevier Inc.Colonoscopy, Adult, Care After The following information offers guidance on how to care for yourself after your procedure. Your health care provider may also give you more specific instructions. If you have problems or questions, contact your health care provider. What can I expect after the procedure? After the procedure, it is common to have: A small amount of blood in your stool for 24 hours after the procedure. Some gas. Mild cramping or bloating of your abdomen. Follow these instructions at home: Eating and drinking  Drink enough fluid to keep your urine pale yellow. Follow instructions from your health care provider about eating or drinking restrictions. Resume your normal diet as told by your health care provider. Avoid heavy or fried foods that are hard to digest. Activity Rest as told by your health care provider. Avoid sitting for a long time without moving. Get up to take short walks every 1-2 hours. This is important to improve blood flow and breathing. Ask for help if you feel weak or unsteady. Return to your normal activities as told by your health care provider. Ask your health care provider what activities are safe for you. Managing cramping and bloating  Try walking around when you have cramps or feel bloated. If directed, apply heat to your abdomen as  told by your health care provider. Use the heat source that your health care provider recommends, such as a moist heat pack or a heating pad. Place a towel between your skin and the heat source. Leave the heat on for 20-30 minutes. Remove the heat if your skin turns bright red. This is especially important if you are unable to feel pain, heat, or cold. You have a greater risk of getting burned. General instructions If you were given a sedative during the procedure, it can affect you for several hours. Do not drive or operate machinery until your health care provider says that it is safe. For the first 24 hours after the procedure: Do not sign important documents. Do not drink alcohol . Do your regular daily activities at a slower pace than normal. Eat soft foods that are easy to digest. Take over-the-counter and prescription medicines only as told by your health care provider. Keep all follow-up visits. This is  important. Contact a health care provider if: You have blood in your stool 2-3 days after the procedure. Get help right away if: You have more than a small spotting of blood in your stool. You have large blood clots in your stool. You have swelling of your abdomen. You have nausea or vomiting. You have a fever. You have increasing pain in your abdomen that is not relieved with medicine. These symptoms may be an emergency. Get help right away. Call 911. Do not wait to see if the symptoms will go away. Do not drive yourself to the hospital. Summary After the procedure, it is common to have a small amount of blood in your stool. You may also have mild cramping and bloating of your abdomen. If you were given a sedative during the procedure, it can affect you for several hours. Do not drive or operate machinery until your health care provider says that it is safe. Get help right away if you have a lot of blood in your stool, nausea or vomiting, a fever, or increased pain in your  abdomen. This information is not intended to replace advice given to you by your health care provider. Make sure you discuss any questions you have with your health care provider. Document Revised: 08/29/2022 Document Reviewed: 03/09/2021 Elsevier Patient Education  2024 Elsevier Inc.General Anesthesia, Adult, Care After The following information offers guidance on how to care for yourself after your procedure. Your health care provider may also give you more specific instructions. If you have problems or questions, contact your health care provider. What can I expect after the procedure? After the procedure, it is common for people to: Have pain or discomfort at the IV site. Have nausea or vomiting. Have a sore throat or hoarseness. Have trouble concentrating. Feel cold or chills. Feel weak, sleepy, or tired (fatigue). Have soreness and body aches. These can affect parts of the body that were not involved in surgery. Follow these instructions at home: For the time period you were told by your health care provider:  Rest. Do not participate in activities where you could fall or become injured. Do not drive or use machinery. Do not drink alcohol . Do not take sleeping pills or medicines that cause drowsiness. Do not make important decisions or sign legal documents. Do not take care of children on your own. General instructions Drink enough fluid to keep your urine pale yellow. If you have sleep apnea, surgery and certain medicines can increase your risk for breathing problems. Follow instructions from your health care provider about wearing your sleep device: Anytime you are sleeping, including during daytime naps. While taking prescription pain medicines, sleeping medicines, or medicines that make you drowsy. Return to your normal activities as told by your health care provider. Ask your health care provider what activities are safe for you. Take over-the-counter and prescription  medicines only as told by your health care provider. Do not use any products that contain nicotine or tobacco. These products include cigarettes, chewing tobacco, and vaping devices, such as e-cigarettes. These can delay incision healing after surgery. If you need help quitting, ask your health care provider. Contact a health care provider if: You have nausea or vomiting that does not get better with medicine. You vomit every time you eat or drink. You have pain that does not get better with medicine. You cannot urinate or have bloody urine. You develop a skin rash. You have a fever. Get help right away if: You have trouble breathing.  You have chest pain. You vomit blood. These symptoms may be an emergency. Get help right away. Call 911. Do not wait to see if the symptoms will go away. Do not drive yourself to the hospital. Summary After the procedure, it is common to have a sore throat, hoarseness, nausea, vomiting, or to feel weak, sleepy, or fatigue. For the time period you were told by your health care provider, do not drive or use machinery. Get help right away if you have difficulty breathing, have chest pain, or vomit blood. These symptoms may be an emergency. This information is not intended to replace advice given to you by your health care provider. Make sure you discuss any questions you have with your health care provider. Document Revised: 10/14/2021 Document Reviewed: 10/14/2021 Elsevier Patient Education  2024 [employer].

## 2024-03-12 ENCOUNTER — Encounter (HOSPITAL_COMMUNITY)
Admission: RE | Admit: 2024-03-12 | Discharge: 2024-03-12 | Disposition: A | Discharge status: Home / Self Care | Source: Ambulatory Visit | Attending: Gastroenterology | Admitting: Gastroenterology

## 2024-03-12 ENCOUNTER — Encounter (HOSPITAL_COMMUNITY): Payer: Self-pay

## 2024-03-12 ENCOUNTER — Other Ambulatory Visit: Payer: Self-pay

## 2024-03-12 VITALS — BP 146/68 | HR 55 | Resp 18 | Ht 68.0 in | Wt 190.0 lb

## 2024-03-12 DIAGNOSIS — D509 Iron deficiency anemia, unspecified: Secondary | ICD-10-CM | POA: Diagnosis not present

## 2024-03-12 DIAGNOSIS — Z01812 Encounter for preprocedural laboratory examination: Secondary | ICD-10-CM | POA: Insufficient documentation

## 2024-03-12 DIAGNOSIS — R7303 Prediabetes: Secondary | ICD-10-CM | POA: Insufficient documentation

## 2024-03-12 LAB — CBC WITH DIFFERENTIAL/PLATELET
Abs Immature Granulocytes: 0.03 K/uL (ref 0.00–0.07)
Basophils Absolute: 0 K/uL (ref 0.0–0.1)
Basophils Relative: 1 %
Eosinophils Absolute: 0.1 K/uL (ref 0.0–0.5)
Eosinophils Relative: 2 %
HCT: 45.7 % (ref 39.0–52.0)
Hemoglobin: 14.8 g/dL (ref 13.0–17.0)
Immature Granulocytes: 1 %
Lymphocytes Relative: 25 %
Lymphs Abs: 1.4 K/uL (ref 0.7–4.0)
MCH: 29.8 pg (ref 26.0–34.0)
MCHC: 32.4 g/dL (ref 30.0–36.0)
MCV: 92 fL (ref 80.0–100.0)
Monocytes Absolute: 0.7 K/uL (ref 0.1–1.0)
Monocytes Relative: 13 %
Neutro Abs: 3.3 K/uL (ref 1.7–7.7)
Neutrophils Relative %: 58 %
Platelets: 193 K/uL (ref 150–400)
RBC: 4.97 MIL/uL (ref 4.22–5.81)
RDW: 12.2 % (ref 11.5–15.5)
WBC: 5.6 K/uL (ref 4.0–10.5)
nRBC: 0 % (ref 0.0–0.2)

## 2024-03-12 LAB — BASIC METABOLIC PANEL WITH GFR
Anion gap: 10 (ref 5–15)
BUN: 15 mg/dL (ref 8–23)
CO2: 23 mmol/L (ref 22–32)
Calcium: 9.3 mg/dL (ref 8.9–10.3)
Chloride: 106 mmol/L (ref 98–111)
Creatinine, Ser: 0.71 mg/dL (ref 0.61–1.24)
GFR, Estimated: >60 mL/min (ref >60)
Glucose, Bld: 96 mg/dL (ref 70–99)
Potassium: 4 mmol/L (ref 3.5–5.1)
Sodium: 139 mmol/L (ref 135–145)

## 2024-03-18 ENCOUNTER — Ambulatory Visit (HOSPITAL_BASED_OUTPATIENT_CLINIC_OR_DEPARTMENT_OTHER): Payer: Self-pay | Admitting: Anesthesiology

## 2024-03-18 ENCOUNTER — Encounter (HOSPITAL_COMMUNITY)
Admission: RE | Disposition: A | Discharge status: Home / Self Care | Payer: Self-pay | Source: Home / Self Care | Attending: Gastroenterology

## 2024-03-18 ENCOUNTER — Encounter (INDEPENDENT_AMBULATORY_CARE_PROVIDER_SITE_OTHER): Payer: Self-pay | Admitting: *Deleted

## 2024-03-18 ENCOUNTER — Ambulatory Visit (HOSPITAL_COMMUNITY)
Admission: RE | Admit: 2024-03-18 | Discharge: 2024-03-18 | Disposition: A | Discharge status: Home / Self Care | Attending: Gastroenterology | Admitting: Gastroenterology

## 2024-03-18 ENCOUNTER — Ambulatory Visit (HOSPITAL_COMMUNITY): Payer: Self-pay | Admitting: Anesthesiology

## 2024-03-18 ENCOUNTER — Encounter (HOSPITAL_COMMUNITY): Payer: Self-pay | Admitting: Gastroenterology

## 2024-03-18 DIAGNOSIS — I10 Essential (primary) hypertension: Secondary | ICD-10-CM | POA: Diagnosis not present

## 2024-03-18 DIAGNOSIS — Z800 Family history of malignant neoplasm of digestive organs: Secondary | ICD-10-CM | POA: Diagnosis not present

## 2024-03-18 DIAGNOSIS — D120 Benign neoplasm of cecum: Secondary | ICD-10-CM | POA: Diagnosis not present

## 2024-03-18 DIAGNOSIS — K648 Other hemorrhoids: Secondary | ICD-10-CM | POA: Insufficient documentation

## 2024-03-18 DIAGNOSIS — K5730 Diverticulosis of large intestine without perforation or abscess without bleeding: Secondary | ICD-10-CM | POA: Insufficient documentation

## 2024-03-18 DIAGNOSIS — D123 Benign neoplasm of transverse colon: Secondary | ICD-10-CM | POA: Diagnosis not present

## 2024-03-18 DIAGNOSIS — Z860100 Personal history of colon polyps, unspecified: Secondary | ICD-10-CM

## 2024-03-18 DIAGNOSIS — Z860101 Personal history of adenomatous and serrated colon polyps: Secondary | ICD-10-CM | POA: Diagnosis not present

## 2024-03-18 DIAGNOSIS — E785 Hyperlipidemia, unspecified: Secondary | ICD-10-CM | POA: Insufficient documentation

## 2024-03-18 DIAGNOSIS — K220 Achalasia of cardia: Secondary | ICD-10-CM | POA: Diagnosis not present

## 2024-03-18 DIAGNOSIS — Z87891 Personal history of nicotine dependence: Secondary | ICD-10-CM | POA: Insufficient documentation

## 2024-03-18 DIAGNOSIS — K449 Diaphragmatic hernia without obstruction or gangrene: Secondary | ICD-10-CM

## 2024-03-18 DIAGNOSIS — K2270 Barrett's esophagus without dysplasia: Secondary | ICD-10-CM | POA: Diagnosis not present

## 2024-03-18 DIAGNOSIS — K2289 Other specified disease of esophagus: Secondary | ICD-10-CM | POA: Diagnosis not present

## 2024-03-18 DIAGNOSIS — Z1211 Encounter for screening for malignant neoplasm of colon: Secondary | ICD-10-CM | POA: Insufficient documentation

## 2024-03-18 HISTORY — PX: COLONOSCOPY: SHX5424

## 2024-03-18 HISTORY — PX: ESOPHAGOGASTRODUODENOSCOPY: SHX5428

## 2024-03-18 LAB — GLUCOSE, CAPILLARY: Glucose-Capillary: 107 mg/dL — ABNORMAL HIGH (ref 70–99)

## 2024-03-18 LAB — HM COLONOSCOPY

## 2024-03-18 SURGERY — COLONOSCOPY
Anesthesia: General

## 2024-03-18 MED ORDER — PROPOFOL 500 MG/50ML IV EMUL
INTRAVENOUS | Status: DC | PRN
  Administered 2024-03-18: 200 ug/kg/min via INTRAVENOUS

## 2024-03-18 MED ORDER — PHENYLEPHRINE 80 MCG/ML (10ML) SYRINGE FOR IV PUSH (FOR BLOOD PRESSURE SUPPORT)
PREFILLED_SYRINGE | INTRAVENOUS | Status: DC | PRN
Start: 2024-03-18 — End: 2024-03-18
  Administered 2024-03-18 (×3): 160 ug via INTRAVENOUS

## 2024-03-18 MED ORDER — PROPOFOL 10 MG/ML IV BOLUS
INTRAVENOUS | Status: DC | PRN
  Administered 2024-03-18: 100 mg via INTRAVENOUS

## 2024-03-18 MED ORDER — LIDOCAINE 2% (20 MG/ML) 5 ML SYRINGE
INTRAMUSCULAR | Status: DC | PRN
  Administered 2024-03-18: 100 mg via INTRAVENOUS

## 2024-03-18 MED ORDER — LACTATED RINGERS IV SOLN: INTRAVENOUS | Status: DC

## 2024-03-18 NOTE — Anesthesia Preprocedure Evaluation (Signed)
 Anesthesia Evaluation  Patient identified by MRN, date of birth, ID band Patient awake    Reviewed: Allergy & Precautions, H&P , NPO status , Patient's Chart, lab work & pertinent test results, reviewed documented beta blocker date and time   Airway Mallampati: II  TM Distance: >3 FB Neck ROM: full    Dental no notable dental hx.    Pulmonary neg pulmonary ROS, former smoker   Pulmonary exam normal breath sounds clear to auscultation       Cardiovascular Exercise Tolerance: Good hypertension, negative cardio ROS  Rhythm:regular Rate:Normal     Neuro/Psych  Neuromuscular disease negative neurological ROS  negative psych ROS   GI/Hepatic negative GI ROS, Neg liver ROS,GERD  ,,  Endo/Other  negative endocrine ROS    Renal/GU negative Renal ROS  negative genitourinary   Musculoskeletal   Abdominal   Peds  Hematology negative hematology ROS (+) Blood dyscrasia, anemia   Anesthesia Other Findings   Reproductive/Obstetrics negative OB ROS                             Anesthesia Physical Anesthesia Plan  ASA: 2  Anesthesia Plan: General   Post-op Pain Management:    Induction:   PONV Risk Score and Plan: Propofol infusion  Airway Management Planned:   Additional Equipment:   Intra-op Plan:   Post-operative Plan:   Informed Consent: I have reviewed the patients History and Physical, chart, labs and discussed the procedure including the risks, benefits and alternatives for the proposed anesthesia with the patient or authorized representative who has indicated his/her understanding and acceptance.     Dental Advisory Given  Plan Discussed with: CRNA  Anesthesia Plan Comments:        Anesthesia Quick Evaluation

## 2024-03-18 NOTE — H&P (Signed)
 David Weiss is an 78 y.o. male.   Chief Complaint: Barrett's esophagus, history of colon polyps and family history of colon cancer. HPI: David Weiss is a 78 y.o. male with past medical history of  jejunal GIST s/p resection, GERD, Barrett's esophagus, hyperlipidemia, hypertension coming for Barrett's esophagus, history of colon polyps and family history of colon cancer.  Last colonoscopy in 2020 had 5 polyps, 4 of them were tubular adenomas. The patient denies having any nausea, vomiting, fever, chills, hematochezia, melena, hematemesis, abdominal distention, abdominal pain, diarrhea, jaundice, pruritus or weight loss.  Brother was diagnosed with colon cancer in his late 42s.   Past Medical History:  Diagnosis Date   Anemia    Barrett's esophagus 11/2009   EGD Dr. Merrie Barrett's, multiple antral/bulbar erosins, D2 erosion sec NSAIDs   Blood transfusion without reported diagnosis    Cancer (HCC)    large intestine   Clotting disorder [facility])    Diverticulosis 11/2009   Colonoscopy Dr Shaaron   Family hx of colon cancer    GERD (gastroesophageal reflux disease)    Helicobacter pylori gastritis 11/2009   HTN (hypertension)    Hx of adenomatous colonic polyps 02/2009   Colonoscopy Dr. Shaaron   Hyperlipemia    Schatzki's ring 11/2009   53F dilator    Past Surgical History:  Procedure Laterality Date   BIOPSY N/A 08/30/2016   Procedure: BIOPSY;  Surgeon: Claudis RAYMOND Rivet, MD;  [facility];  Service: Endoscopy;  Laterality: N/A;  esophageal   BIOPSY  09/01/2021   Procedure: BIOPSY;  Surgeon: Rivet Claudis RAYMOND, MD;  [facility];  Service: Endoscopy;;   CAPSULE ENDOSCOPY OF THE SMALL BOWEL  02/17/2011       COLON SURGERY     COLONOSCOPY  05/19/2011   Procedure: COLONOSCOPY;  Surgeon: Lamar CHRISTELLA Shaaron, MD;  [facility];  Service: Endoscopy;  Laterality: N/A;  8:30   COLONOSCOPY N/A 08/21/2013   Procedure: COLONOSCOPY;  Surgeon: Claudis RAYMOND Rivet, MD;  [facility];  Service: Endoscopy;  Laterality: N/A;  930   COLONOSCOPY N/A 02/26/2019   Procedure: COLONOSCOPY;  Surgeon: Rivet Claudis RAYMOND, MD;  [facility];  Service: Endoscopy;  Laterality: N/A;  9:30-rescheduled to 5/20 @ 11:15 per Jenkins   EAR CYST EXCISION N/A 03/25/2014   Procedure: EXCISION OF NEOPLASM NECK;  Surgeon: Oneil DELENA Budge, MD;  Location: [facility];  Service: General;  Laterality: N/A;   ESOPHAGOGASTRODUODENOSCOPY  02/13/2011   Procedure: ESOPHAGOGASTRODUODENOSCOPY (EGD);  Surgeon: Lamar CHRISTELLA Shaaron, MD;  [facility];  Service: Endoscopy;  Laterality: N/A;   ESOPHAGOGASTRODUODENOSCOPY N/A 08/30/2016   Procedure: ESOPHAGOGASTRODUODENOSCOPY (EGD);  Surgeon: Claudis RAYMOND Rivet, MD;  [facility];  Service: Endoscopy;  Laterality: N/A;  730   ESOPHAGOGASTRODUODENOSCOPY (EGD) WITH PROPOFOL  N/A 09/01/2021   Procedure: ESOPHAGOGASTRODUODENOSCOPY (EGD) WITH PROPOFOL ;  Surgeon: Rivet Claudis RAYMOND, MD;  [facility];  Service: Endoscopy;  Laterality: N/A;  910   excisional hernia  01/28/2013   EYE SURGERY Bilateral    cataract extraction   GIVENS CAPSULE STUDY  02/15/2011   Procedure: GIVENS CAPSULE STUDY;  Surgeon: Lamar CHRISTELLA Shaaron, MD;  [facility];  Service: Endoscopy;  Laterality: N/A;   HERNIA REPAIR     POLYPECTOMY  02/26/2019   Procedure: POLYPECTOMY;  Surgeon: Rivet Claudis RAYMOND, MD;  [facility];  Service: Endoscopy;;  colon   small intestine partial removal  06/01/2011  due to malignant tumor   VASECTOMY      Family History  Problem Relation Age of Onset   Diabetes Mother    Heart failure Mother    Hearing loss Mother    Heart disease Mother    Prostate cancer Father 66   Cancer Father    Hearing loss Father    Heart disease Father    Cancer Brother    Hearing loss Brother    Colon cancer Brother 33   Hearing loss Sister    Social History:  reports that he quit smoking about 43 years  ago. His smoking use included cigarettes. He started smoking about 63 years ago. He has a 40 pack-year smoking history. He has never used smokeless tobacco. He reports current alcohol  use of about 15.0 standard drinks of alcohol  per week. He reports that he does not use drugs.  Allergies:  Allergies  Allergen Reactions   Penicillins Itching    Did it involve swelling of the face/tongue/throat, SOB, or low BP? Unknown Did it involve sudden or severe rash/hives, skin peeling, or any reaction on the inside of your mouth or nose? Unknown Did you need to seek medical attention at a hospital or doctor's office? Unknown When did it last happen?childhood reaction  If all above answers are "NO", may proceed with cephalosporin use.    Pantoprazole Sodium Itching and Rash    Medications Prior to Admission  Medication Sig Dispense Refill   amLODipine  (NORVASC ) 5 MG tablet Take 1 tablet (5 mg total) by mouth daily. 90 tablet 3   atorvastatin  (LIPITOR) 10 MG tablet Take 1 tablet (10 mg total) by mouth daily. 90 tablet 3   benazepril  (LOTENSIN ) 10 MG tablet Take 1 tablet (10 mg total) by mouth daily. 90 tablet 3   cyclobenzaprine  (FLEXERIL ) 5 MG tablet Take 1 tablet (5 mg total) by mouth 3 (three) times daily as needed for muscle spasms. 30 tablet 1   omeprazole  (PRILOSEC) 20 MG capsule Take 1 capsule (20 mg total) by mouth daily. 90 capsule 3   tamsulosin  (FLOMAX ) 0.4 MG CAPS capsule Take 1 capsule (0.4 mg total) by mouth daily. 30 capsule 3    Results for orders placed or performed during the hospital encounter of 03/18/24 (from the past 48 hours)  Glucose, capillary     Status: Abnormal   Collection Time: 03/18/24  6:36 AM  Result Value Ref Range   Glucose-Capillary 107 (H) 70 - 99 mg/dL    Comment: Glucose reference range applies only to samples taken after fasting for at least 8 hours.   No results found.  Review of Systems  All other systems reviewed and are negative.   Blood pressure  (!) 152/74, temperature 98.2 F (36.8 C), resp. rate 19, height 5' 8 (1.727 m), weight 86.2 kg, SpO2 94%. Physical Exam  GENERAL: The patient is AO x3, in no acute distress. HEENT: Head is normocephalic and atraumatic. EOMI are intact. Mouth is well hydrated and without lesions. NECK: Supple. No masses LUNGS: Clear to auscultation. No presence of rhonchi/wheezing/rales. Adequate chest expansion HEART: RRR, normal s1 and s2. ABDOMEN: Soft, nontender, no guarding, no peritoneal signs, and nondistended. BS +. No masses. EXTREMITIES: Without any cyanosis, clubbing, rash, lesions or edema. NEUROLOGIC: AOx3, no focal motor deficit. SKIN: no jaundice, no rashes  Assessment/Plan David Weiss is a 78 y.o. male with past medical history of  jejunal GIST s/p resection, GERD, Barrett's esophagus, hyperlipidemia, hypertension coming for Barrett's esophagus, history of  colon polyps and family history of colon cancer.  Will proceed with EGD and colonoscopy.  Toribio Eartha Flavors, MD 03/18/2024, 7:32 AM

## 2024-03-18 NOTE — Op Note (Signed)
 [facility] Patient Name: David Weiss Procedure Date: 03/18/2024 7:14 AM MRN: [id number] Date of Birth: 06-Dec-1945 Attending MD: Toribio Fortune , , [id number] CSN: [id number] Age: 78 Admit Type: Outpatient Procedure:                Upper GI endoscopy Indications:              Follow-up of Barrett's esophagus Providers:                Toribio Fortune, Harlene Lips, [place] Wilson,                            Technician Referring MD:              Medicines:                Monitored Anesthesia Care Complications:            No immediate complications. Estimated Blood Loss:     Estimated blood loss: none. Procedure:                Pre-Anesthesia Assessment:                           - Prior to the procedure, a History and Physical                            was performed, and patient medications, allergies                            and sensitivities were reviewed. The patient's                            tolerance of previous anesthesia was reviewed.                           - The risks and benefits of the procedure and the                            sedation options and risks were discussed with the                            patient. All questions were answered and informed                            consent was obtained.                           - ASA Grade Assessment: II - A patient with mild                            systemic disease.                           After obtaining informed consent, the endoscope was                            passed under direct vision. Throughout the  procedure, the patient's blood pressure, pulse, and                            oxygen saturations were monitored continuously. The                            [id number] ([id number]) Upper was introduced through                            the mouth, and advanced to the second part of                            duodenum. The upper GI endoscopy was accomplished                             without difficulty. The patient tolerated the                            procedure well. Scope In: 7:48:17 AM Scope Out: 7:53:02 AM Total Procedure Duration: 0 hours 4 minutes 45 seconds  Findings:      The Z-line was irregular and was found 38 cm from the incisors. Biopsies       were taken with a cold forceps for histology.      A 2 cm hiatal hernia was present.      The stomach was normal.      The examined duodenum was normal. Impression:               - Z-line irregular, 38 cm from the incisors.                            Biopsied.                           - 2 cm hiatal hernia.                           - Normal stomach.                           - Normal examined duodenum. Moderate Sedation:      Per Anesthesia Care Recommendation:           - Discharge patient to home (ambulatory).                           - Resume previous diet.                           - Await pathology results.                           - Repeat upper endoscopy for surveillance based on                            pathology results.                           -  Continue present medications. Procedure Code(s):        --- Professional ---                           [phone], Esophagogastroduodenoscopy, flexible,                            transoral; with biopsy, single or multiple Diagnosis Code(s):        --- Professional ---                           K22.89, Other specified disease of esophagus                           K44.9, Diaphragmatic hernia without obstruction or                            gangrene                           K22.70, Barrett's esophagus without dysplasia CPT copyright 2022 American Medical Association. All rights reserved. The codes documented in this report are preliminary and upon coder review may  be revised to meet current compliance requirements. Toribio Fortune, MD Toribio Fortune,  03/18/2024 8:35:45 AM This report has been signed electronically. Number of  Addenda: 0

## 2024-03-18 NOTE — Transfer of Care (Signed)
 Immediate Anesthesia Transfer of Care Note  Patient: David Weiss  Procedure(s) Performed: COLONOSCOPY EGD (ESOPHAGOGASTRODUODENOSCOPY)  Patient Location: Short Stay  Anesthesia Type:General  Level of Consciousness: drowsy  Airway & Oxygen Therapy: Patient Spontanous Breathing  Post-op Assessment: Report given to RN and Post -op Vital signs reviewed and stable  Post vital signs: Reviewed and stable  Last Vitals:  Vitals Value Taken Time  BP    Temp 36.2 C 03/18/24 08:32  Pulse 54 03/18/24 08:32  Resp 17 03/18/24 08:32  SpO2 95 % 03/18/24 08:32    Last Pain:  Vitals:   03/18/24 0832  TempSrc: Axillary  PainSc: 0-No pain         Complications: No notable events documented.

## 2024-03-18 NOTE — Discharge Instructions (Addendum)
 You are being discharged to home.  Resume your previous diet.  We are waiting for your pathology results.  Your physician has recommended a repeat upper endoscopy for surveillance based on pathology results.  Continue your present medications.  Your physician has recommended a repeat colonoscopy for surveillance based on pathology results.

## 2024-03-18 NOTE — Op Note (Signed)
 [facility] Patient Name: David Weiss Procedure Date: 03/18/2024 7:12 AM MRN: [id number] Date of Birth: 1946-01-21 Attending MD: Toribio Fortune , , [id number] CSN: [id number] Age: 78 Admit Type: Outpatient Procedure:                Colonoscopy Indications:              Surveillance: Personal history of adenomatous                            polyps on last colonoscopy 5 years ago, Family                            history of colon cancer in a first-degree relative                            before age 56 years Providers:                Toribio Fortune, Harlene Lips, [place] Wilson,                            Technician Referring MD:              Medicines:                Monitored Anesthesia Care Complications:            No immediate complications. Estimated Blood Loss:     Estimated blood loss: none. Procedure:                Pre-Anesthesia Assessment:                           - Prior to the procedure, a History and Physical                            was performed, and patient medications, allergies                            and sensitivities were reviewed. The patient's                            tolerance of previous anesthesia was reviewed.                           - The risks and benefits of the procedure and the                            sedation options and risks were discussed with the                            patient. All questions were answered and informed                            consent was obtained.                           - ASA Grade Assessment: II - A patient  with mild                            systemic disease.                           After obtaining informed consent, the colonoscope                            was passed under direct vision. Throughout the                            procedure, the patient's blood pressure, pulse, and                            oxygen saturations were monitored continuously. The                             [id number]-[id number] ([id number]) Peds Colon was introduced                            through the anus and advanced to the the cecum,                            identified by appendiceal orifice and ileocecal                            valve. The colonoscopy was performed without                            difficulty. The patient tolerated the procedure                            well. The quality of the bowel preparation was                            adequate. Scope In: 7:59:47 AM Scope Out: 8:29:29 AM Scope Withdrawal Time: 0 hours 20 minutes 52 seconds  Total Procedure Duration: 0 hours 29 minutes 42 seconds  Findings:      The perianal and digital rectal examinations were normal.      A 1 mm polyp was found in the cecum. The polyp was sessile. The polyp       was removed with a cold biopsy forceps. Resection and retrieval were       complete.      Four sessile polyps were found in the transverse colon and cecum. The       polyps were 2 to 8 mm in size. These polyps were removed with a cold       snare. Resection and retrieval were complete.      Scattered large-mouthed diverticula were found in the sigmoid colon.      Non-bleeding internal hemorrhoids were found during retroflexion. The       hemorrhoids were small. Impression:               - One 1 mm polyp in the cecum, removed with a cold  biopsy forceps. Resected and retrieved.                           - Four 2 to 8 mm polyps in the transverse colon and                            in the cecum, removed with a cold snare. Resected                            and retrieved.                           - Diverticulosis in the sigmoid colon.                           - Non-bleeding internal hemorrhoids. Moderate Sedation:      Per Anesthesia Care Recommendation:           - Discharge patient to home (ambulatory).                           - Resume previous diet.                           - Await pathology results.                            - Repeat colonoscopy for surveillance based on                            pathology results. Procedure Code(s):        --- Professional ---                           [phone], Colonoscopy, flexible; with removal of                            tumor(s), polyp(s), or other lesion(s) by snare                            technique                           45380, 59, Colonoscopy, flexible; with biopsy,                            single or multiple Diagnosis Code(s):        --- Professional ---                           Z86.010, Personal history of colonic polyps                           D12.0, Benign neoplasm of cecum                           D12.3, Benign neoplasm of transverse colon (hepatic  flexure or splenic flexure)                           K64.8, Other hemorrhoids                           Z80.0, Family history of malignant neoplasm of                            digestive organs                           K57.30, Diverticulosis of large intestine without                            perforation or abscess without bleeding CPT copyright 2022 American Medical Association. All rights reserved. The codes documented in this report are preliminary and upon coder review may  be revised to meet current compliance requirements. Toribio Fortune, MD Toribio Fortune,  03/18/2024 8:41:17 AM This report has been signed electronically. Number of Addenda: 0

## 2024-03-18 NOTE — Anesthesia Postprocedure Evaluation (Signed)
 Anesthesia Post Note  Patient: David Weiss  Procedure(s) Performed: COLONOSCOPY EGD (ESOPHAGOGASTRODUODENOSCOPY)  Patient location during evaluation: Phase II Anesthesia Type: General Level of consciousness: awake Pain management: pain level controlled Vital Signs Assessment: post-procedure vital signs reviewed and stable Respiratory status: spontaneous breathing and respiratory function stable Cardiovascular status: blood pressure returned to baseline and stable Postop Assessment: no headache and no apparent nausea or vomiting Anesthetic complications: no Comments: Late entry   No notable events documented.   Last Vitals:  Vitals:   03/18/24 0836 03/18/24 0838  BP: (!) 80/52 (!) 103/55  Pulse:    Resp:    Temp:    SpO2:      Last Pain:  Vitals:   03/18/24 0832  TempSrc: Axillary  PainSc: 0-No pain                 Yvonna JINNY Bosworth

## 2024-03-19 ENCOUNTER — Ambulatory Visit: Payer: Self-pay | Admitting: Gastroenterology

## 2024-03-19 LAB — SURGICAL PATHOLOGY

## 2024-03-20 ENCOUNTER — Encounter (HOSPITAL_COMMUNITY): Payer: Self-pay | Admitting: Gastroenterology

## 2024-03-20 ENCOUNTER — Encounter (INDEPENDENT_AMBULATORY_CARE_PROVIDER_SITE_OTHER): Payer: Self-pay | Admitting: *Deleted

## 2024-03-20 NOTE — Progress Notes (Signed)
 5 yr TCS & EGD noted in recall Patient result letter mailed procedure note and pathology result faxed to PCP

## 2024-05-02 ENCOUNTER — Ambulatory Visit: Payer: Self-pay | Admitting: *Deleted

## 2024-05-02 NOTE — Telephone Encounter (Signed)
 FYI Only or Action Required?: FYI only for provider.  Patient was last seen in primary care on 01/08/2024 by Melvenia Manus BRAVO, MD.  Called Nurse Triage reporting Chest Pain.  Symptoms began today.  Interventions attempted: Nothing.  Symptoms are: stable.  Triage Disposition: Go to ED Now (Notify PCP)  Patient/caregiver understands and will follow disposition?: Yes   Reason for Disposition  [1] Chest pain (or angina) comes and goes AND [2] is happening more often (increasing in frequency) or getting worse (increasing in severity)  (Exception: Chest pains that last only a few seconds.)  Answer Assessment - Initial Assessment Questions Patient is having other symptoms- but chest tightness is high priority symptom. ED advised  1. LOCATION: Where does it hurt?       Right to left- all across 2. RADIATION: Does the pain go anywhere else? (e.g., into neck, jaw, arms, back)     Upper chest 3. ONSET: When did the chest pain begin? (Minutes, hours or days)      This morning- twice 4. PATTERN: Does the pain come and go, or has it been constant since it started?  Does it get worse with exertion?      Comes and goes 5. DURATION: How long does it last (e.g., seconds, minutes, hours)     second 6. SEVERITY: How bad is the pain?  (e.g., Scale 1-10; mild, moderate, or severe)     3/10 7. CARDIAC RISK FACTORS: Do you have any history of heart problems or risk factors for heart disease? (e.g., angina, prior heart attack; diabetes, high blood pressure, high cholesterol, smoker, or strong family history of heart disease)     Slow heart rate 8. PULMONARY RISK FACTORS: Do you have any history of lung disease?  (e.g., blood clots in lung, asthma, emphysema, birth control pills)     no 9. CAUSE: What do you think is causing the chest pain?     No idea 10. OTHER SYMPTOMS: Do you have any other symptoms? (e.g., dizziness, nausea, vomiting, sweating, fever, difficulty breathing,  cough)       Dizziness with getting up- sometimes  Protocols used: Chest Pain-A-AH   Copied from CRM #[id number]. Topic: Clinical - Red Word Triage >> May 02, 2024  8:03 AM Donna BRAVO wrote: Red Word that prompted transfer to Nurse Triage:  patient 3 days ago a swelling , left shoulder the size of an 1/2 egg doesn't hurt,  and aches bigger in the afternoon  Patient has a tightness goes across upper chest right to left, lasts a few seconds, happening more often this has occurred 2 times this morning,   Patient has Apple watch: -resting heart rate goes down to mid 40's to 50's when he stays still  - pulse seems to be fine 69 a minute ago -BP last night 140/70

## 2024-05-02 NOTE — Telephone Encounter (Signed)
 Noted

## 2024-05-14 ENCOUNTER — Encounter (INDEPENDENT_AMBULATORY_CARE_PROVIDER_SITE_OTHER): Payer: Self-pay | Admitting: Gastroenterology

## 2024-06-30 ENCOUNTER — Ambulatory Visit (HOSPITAL_COMMUNITY)
Admission: RE | Admit: 2024-06-30 | Discharge: 2024-06-30 | Disposition: A | Source: Ambulatory Visit | Attending: Internal Medicine

## 2024-06-30 ENCOUNTER — Ambulatory Visit: Payer: Self-pay | Admitting: Internal Medicine

## 2024-06-30 ENCOUNTER — Encounter: Payer: Self-pay | Admitting: Internal Medicine

## 2024-06-30 VITALS — BP 138/60 | HR 74 | Ht 68.0 in | Wt 198.6 lb

## 2024-06-30 DIAGNOSIS — R351 Nocturia: Secondary | ICD-10-CM

## 2024-06-30 DIAGNOSIS — K2270 Barrett's esophagus without dysplasia: Secondary | ICD-10-CM | POA: Diagnosis not present

## 2024-06-30 DIAGNOSIS — N401 Enlarged prostate with lower urinary tract symptoms: Secondary | ICD-10-CM | POA: Diagnosis not present

## 2024-06-30 DIAGNOSIS — M25512 Pain in left shoulder: Secondary | ICD-10-CM | POA: Diagnosis not present

## 2024-06-30 DIAGNOSIS — E782 Mixed hyperlipidemia: Secondary | ICD-10-CM

## 2024-06-30 DIAGNOSIS — I10 Essential (primary) hypertension: Secondary | ICD-10-CM

## 2024-06-30 DIAGNOSIS — R7303 Prediabetes: Secondary | ICD-10-CM

## 2024-06-30 DIAGNOSIS — E559 Vitamin D deficiency, unspecified: Secondary | ICD-10-CM | POA: Diagnosis not present

## 2024-06-30 MED ORDER — ATORVASTATIN CALCIUM 10 MG PO TABS: 10.0000 mg | ORAL_TABLET | Freq: Every day | ORAL | 3 refills | Status: AC

## 2024-06-30 MED ORDER — AMLODIPINE BESYLATE 5 MG PO TABS: 5.0000 mg | ORAL_TABLET | Freq: Every day | ORAL | 3 refills | Status: AC

## 2024-06-30 MED ORDER — BENAZEPRIL HCL 10 MG PO TABS: 10.0000 mg | ORAL_TABLET | Freq: Every day | ORAL | 3 refills | Status: AC

## 2024-06-30 NOTE — Progress Notes (Signed)
 Established Patient Office Visit  Subjective:  Patient ID: David Weiss, male    DOB: 1945-08-26  Age: 78 y.o. MRN: [id number]  CC:  Chief Complaint  Patient presents with   Shoulder Pain    Pt reports knot on left shoulder , ongoing 3 weeks.     HPI David Weiss is a 78 y.o. male with past medical history of HTN, GIST, Barrett's esophagus, GERD, HLD, prediabetes and BPH who presents for f/u of his chronic medical conditions.  He reports left shoulder pain for the last 3 weeks, which is intermittent.  He has noticed a bump on the left shoulder area, which he noticed after exertional activity in the yard.  Denies any major injury or fall.  ROM is intact at left shoulder.  HTN: BP is well-controlled. Takes amlodipine  5 mg QD and atorvastatin  10 mg QD regularly. Patient denies headache, dizziness, chest pain, dyspnea or palpitations.  He takes atorvastatin  10 mg QD for HLD.  Barrett's esophagus: Followed by GI.  He takes omeprazole  20 mg QD.  He reports epigastric discomfort at times if he tries to lie on the left side, which lasts for few seconds.  Denies any nausea or vomiting currently.  BPH: He has to wake up 2-3 at nighttime for urination.  He was prescribed tamsulosin , but had erectile dysfunction with it.  He prefers to avoid any medicine for BPH for now.   Past Medical History:  Diagnosis Date   Anemia    Barrett's esophagus 11/2009   EGD Dr. Merrie Barrett's, multiple antral/bulbar erosins, D2 erosion sec NSAIDs   Blood transfusion without reported diagnosis    Cancer (HCC)    large intestine   Clotting disorder    Diverticulosis 11/2009   Colonoscopy Dr Shaaron   Family hx of colon cancer    GERD (gastroesophageal reflux disease)    Helicobacter pylori gastritis 11/2009   HTN (hypertension)    Hx of adenomatous colonic polyps 02/2009   Colonoscopy Dr. Shaaron   Hyperlipemia    Schatzki's ring 11/2009   59F dilator    Past Surgical History:  Procedure  Laterality Date   BIOPSY N/A 08/30/2016   Procedure: BIOPSY;  Surgeon: Claudis RAYMOND Rivet, MD;  Location: [facility];  Service: Endoscopy;  Laterality: N/A;  esophageal   BIOPSY  09/01/2021   Procedure: BIOPSY;  Surgeon: Rivet Claudis RAYMOND, MD;  Location: [facility];  Service: Endoscopy;;   CAPSULE ENDOSCOPY OF THE SMALL BOWEL  02/17/2011       COLON SURGERY     COLONOSCOPY  05/19/2011   Procedure: COLONOSCOPY;  Surgeon: Lamar CHRISTELLA Shaaron, MD;  Location: [facility];  Service: Endoscopy;  Laterality: N/A;  8:30   COLONOSCOPY N/A 08/21/2013   Procedure: COLONOSCOPY;  Surgeon: Claudis RAYMOND Rivet, MD;  Location: [facility];  Service: Endoscopy;  Laterality: N/A;  930   COLONOSCOPY N/A 02/26/2019   Procedure: COLONOSCOPY;  Surgeon: Rivet Claudis RAYMOND, MD;  [facility]: [facility];  Service: Endoscopy;  Laterality: N/A;  9:30-rescheduled to 5/20 @ 11:15 per Ann   COLONOSCOPY N/A 03/18/2024   Procedure: COLONOSCOPY;  Surgeon: Eartha Angelia Sieving, MD;  Location: [facility];  Service: Gastroenterology;  Laterality: N/A;  7:30 am, asa 3   EAR CYST EXCISION N/A 03/25/2014   Procedure: EXCISION OF NEOPLASM NECK;  Surgeon: Oneil DELENA Budge, MD;  Location: [facility];  Service: General;  Laterality: N/A;   ESOPHAGOGASTRODUODENOSCOPY  02/13/2011   Procedure: ESOPHAGOGASTRODUODENOSCOPY (EGD);  Surgeon: Lamar CHRISTELLA Hollingshead, MD;  [facility]: [facility];  Service: Endoscopy;  Laterality: N/A;   ESOPHAGOGASTRODUODENOSCOPY N/A 08/30/2016   Procedure: ESOPHAGOGASTRODUODENOSCOPY (EGD);  Surgeon: Claudis RAYMOND Rivet, MD;  [facility]: [facility];  Service: Endoscopy;  Laterality: N/A;  730   ESOPHAGOGASTRODUODENOSCOPY N/A 03/18/2024   Procedure: EGD (ESOPHAGOGASTRODUODENOSCOPY);  Surgeon: Eartha Flavors, Toribio, MD;  [facility]: [facility];  Service: Gastroenterology;  Laterality: N/A;   ESOPHAGOGASTRODUODENOSCOPY (EGD) WITH PROPOFOL  N/A 09/01/2021   Procedure: ESOPHAGOGASTRODUODENOSCOPY (EGD) WITH PROPOFOL ;  Surgeon:  Rivet Claudis RAYMOND, MD;  [facility]: [facility];  Service: Endoscopy;  Laterality: N/A;  910   excisional hernia  01/28/2013   EYE SURGERY Bilateral    cataract extraction   GIVENS CAPSULE STUDY  02/15/2011   Procedure: GIVENS CAPSULE STUDY;  Surgeon: Lamar CHRISTELLA Hollingshead, MD;  [facility]: [facility];  Service: Endoscopy;  Laterality: N/A;   HERNIA REPAIR     POLYPECTOMY  02/26/2019   Procedure: POLYPECTOMY;  Surgeon: Rivet Claudis RAYMOND, MD;  Location: [facility];  Service: Endoscopy;;  colon   small intestine partial removal  06/01/2011   due to malignant tumor   VASECTOMY      Family History  Problem Relation Age of Onset   Diabetes Mother    Heart failure Mother    Hearing loss Mother    Heart disease Mother    Prostate cancer Father 20   Cancer Father    Hearing loss Father    Heart disease Father    Cancer Brother    Hearing loss Brother    Colon cancer Brother 62   Hearing loss Sister     Social History   Socioeconomic History   Marital status: Married    Spouse name: Not on file   Number of children: 2   Years of education: Not on file   Highest education level: Not on file  Occupational History   Occupation: retired    Comment: airline pilot  Tobacco Use   Smoking status: Former    Current packs/day: 0.00    Average packs/day: 2.0 packs/day for 20.0 years (40.0 ttl pk-yrs)    Types: Cigarettes    Start date: 03/14/1961    Quit date: 03/14/1981    Years since quitting: 43.3   Smokeless tobacco: Never   Tobacco comments:    quit 1982  Vaping Use   Vaping status: Never Used  Substance and Sexual Activity   Alcohol  use: Yes    Alcohol /week: 15.0 standard drinks of alcohol     Types: 15 Standard drinks or equivalent per week    Comment: two drinks a day bourbon   Drug use: No   Sexual activity: Yes  Other Topics Concern   Not on file  Social History Narrative   Lives w/ wife   Social Drivers of Health   Financial Resource Strain: Low Risk  (02/21/2023)   Overall  Financial Resource Strain (CARDIA)    Difficulty of Paying Living Expenses: Not hard at all  Food Insecurity: No Food Insecurity (02/21/2023)   Hunger Vital Sign    Worried About Running Out of Food in the Last Year: Never true    Ran Out of Food in the Last Year: Never true  Transportation Needs: No Transportation Needs (02/21/2023)   PRAPARE - Administrator, Civil Service (Medical): No    Lack of Transportation (Non-Medical): No  Physical Activity: Insufficiently Active (02/21/2023)   Exercise Vital Sign    Days of Exercise  per Week: 7 days    Minutes of Exercise per Session: 20 min  Stress: No Stress Concern Present (02/21/2023)   [employer] of Occupational Health - Occupational Stress Questionnaire    Feeling of Stress : Not at all  Social Connections: Socially Integrated (02/21/2023)   Social Connection and Isolation Panel    Frequency of Communication with Friends and Family: More than three times a week    Frequency of Social Gatherings with Friends and Family: More than three times a week    Attends Religious Services: More than 4 times per year    Active Member of [employer] or Organizations: Yes    Attends Engineer, Structural: More than 4 times per year    Marital Status: Married  Catering Manager Violence: Not At Risk (02/21/2023)   Humiliation, Afraid, Rape, and Kick questionnaire    Fear of Current or Ex-Partner: No    Emotionally Abused: No    Physically Abused: No    Sexually Abused: No    Outpatient Medications Prior to Visit  Medication Sig Dispense Refill   omeprazole  (PRILOSEC) 20 MG capsule Take 1 capsule (20 mg total) by mouth daily. 90 capsule 3   amLODipine  (NORVASC ) 5 MG tablet Take 1 tablet (5 mg total) by mouth daily. 90 tablet 3   atorvastatin  (LIPITOR) 10 MG tablet Take 1 tablet (10 mg total) by mouth daily. 90 tablet 3   benazepril  (LOTENSIN ) 10 MG tablet Take 1 tablet (10 mg total) by mouth daily. 90 tablet 3   cyclobenzaprine   (FLEXERIL ) 5 MG tablet Take 1 tablet (5 mg total) by mouth 3 (three) times daily as needed for muscle spasms. 30 tablet 1   tamsulosin  (FLOMAX ) 0.4 MG CAPS capsule Take 1 capsule (0.4 mg total) by mouth daily. 30 capsule 3   No facility-administered medications prior to visit.    Allergies  Allergen Reactions   Penicillins Itching    Did it involve swelling of the face/tongue/throat, SOB, or low BP? Unknown Did it involve sudden or severe rash/hives, skin peeling, or any reaction on the inside of your mouth or nose? Unknown Did you need to seek medical attention at a hospital or doctor's office? Unknown When did it last happen?childhood reaction  If all above answers are "NO", may proceed with cephalosporin use.    Pantoprazole Sodium Itching and Rash    ROS Review of Systems  Constitutional:  Negative for chills and fever.  HENT:  Negative for congestion and sore throat.   Eyes:  Negative for pain and discharge.  Respiratory:  Negative for cough and shortness of breath.   Cardiovascular:  Negative for chest pain and palpitations.  Gastrointestinal:  Negative for diarrhea, nausea and vomiting.  Endocrine: Negative for polydipsia and polyuria.  Genitourinary:  Negative for dysuria and hematuria.  Musculoskeletal:  Positive for arthralgias (Left shoulder). Negative for neck pain and neck stiffness.  Skin:  Negative for rash.  Neurological:  Negative for dizziness and weakness.  Psychiatric/Behavioral:  Negative for agitation and behavioral problems.       Objective:    Physical Exam Vitals reviewed.  Constitutional:      General: He is not in acute distress.    Appearance: He is not diaphoretic.  HENT:     Head: Normocephalic and atraumatic.     Nose: Nose normal.     Mouth/Throat:     Mouth: Mucous membranes are moist.  Eyes:     General: No scleral icterus.  Extraocular Movements: Extraocular movements intact.  Cardiovascular:     Rate and Rhythm: Normal rate and  regular rhythm.     Heart sounds: Normal heart sounds. No murmur heard. Pulmonary:     Breath sounds: Normal breath sounds. No wheezing or rales.  Abdominal:     Palpations: Abdomen is soft.     Tenderness: There is no abdominal tenderness.  Musculoskeletal:     Left shoulder: Tenderness (Mild AC point tenderness) present. Normal range of motion.     Cervical back: Neck supple. No tenderness.     Right lower leg: No edema.     Left lower leg: No edema.  Skin:    General: Skin is warm.     Findings: No rash.  Neurological:     General: No focal deficit present.     Mental Status: He is alert and oriented to person, place, and time.  Psychiatric:        Mood and Affect: Mood normal.        Behavior: Behavior normal.     BP 138/60 (BP Location: Right Arm)   Pulse 74   Ht 5' 8 (1.727 m)   Wt 198 lb 9.6 oz (90.1 kg)   SpO2 96%   BMI 30.20 kg/m  Wt Readings from Last 3 Encounters:  06/30/24 198 lb 9.6 oz (90.1 kg)  03/18/24 190 lb 0.6 oz (86.2 kg)  03/12/24 190 lb (86.2 kg)    Lab Results  Component Value Date   TSH 2.660 07/09/2023   Lab Results  Component Value Date   WBC 5.6 03/12/2024   HGB 14.8 03/12/2024   HCT 45.7 03/12/2024   MCV 92.0 03/12/2024   PLT 193 03/12/2024   Lab Results  Component Value Date   NA 139 03/12/2024   K 4.0 03/12/2024   CO2 23 03/12/2024   GLUCOSE 96 03/12/2024   BUN 15 03/12/2024   CREATININE 0.71 03/12/2024   BILITOT 0.5 07/09/2023   ALKPHOS 85 07/09/2023   AST 28 07/09/2023   ALT 32 07/09/2023   PROT 6.7 07/09/2023   ALBUMIN 4.4 07/09/2023   CALCIUM  9.3 03/12/2024   ANIONGAP 10 03/12/2024   EGFR 88 07/09/2023   Lab Results  Component Value Date   CHOL 155 07/09/2023   Lab Results  Component Value Date   HDL 57 07/09/2023   Lab Results  Component Value Date   LDLCALC 80 07/09/2023   Lab Results  Component Value Date   TRIG 98 07/09/2023   Lab Results  Component Value Date   CHOLHDL 2.7 07/09/2023   Lab  Results  Component Value Date   HGBA1C 5.8 (H) 07/09/2023      Assessment & Plan:   Problem List Items Addressed This Visit       Cardiovascular and Mediastinum   Essential hypertension - Primary   BP Readings from Last 1 Encounters:  06/30/24 138/60   Well-controlled with amlodipine  5 mg QD and benazepril  10 mg QD Counseled for compliance with the medications Advised DASH diet and moderate exercise/walking as tolerated      Relevant Medications   benazepril  (LOTENSIN ) 10 MG tablet   atorvastatin  (LIPITOR) 10 MG tablet   amLODipine  (NORVASC ) 5 MG tablet   Other Relevant Orders   TSH   CMP14+EGFR   CBC with Differential/Platelet     Digestive   Barrett's esophagus without dysplasia   Continue omeprazole  20 mg QD Followed by GI        Other  Hyperlipidemia   Continue atorvastatin  10 mg QD Check lipid profile      Relevant Medications   benazepril  (LOTENSIN ) 10 MG tablet   atorvastatin  (LIPITOR) 10 MG tablet   amLODipine  (NORVASC ) 5 MG tablet   Other Relevant Orders   Lipid panel   BPH associated with nocturia   DC tamsulosin  due to erectile dysfunction Symptoms manageable for now, he prefers to avoid medicine for now Check PSA      Relevant Orders   PSA   Prediabetes   Lab Results  Component Value Date   HGBA1C 5.8 (H) 07/09/2023   Advised to follow DASH diet      Relevant Orders   Hemoglobin A1c   Acute pain of left shoulder   Mild AC point tenderness, could be AC joint arthritis, worse due to recent exertion Tylenol  arthritis as needed for pain, prefers to avoid NSAID Heating pad as needed for pain Check x-ray of left shoulder      Relevant Orders   DG Shoulder Left   Other Visit Diagnoses       Vitamin D  deficiency       Relevant Orders   VITAMIN D  25 Hydroxy (Vit-D Deficiency, Fractures)       Meds ordered this encounter  Medications   benazepril  (LOTENSIN ) 10 MG tablet    Sig: Take 1 tablet (10 mg total) by mouth daily.     Dispense:  90 tablet    Refill:  3   atorvastatin  (LIPITOR) 10 MG tablet    Sig: Take 1 tablet (10 mg total) by mouth daily.    Dispense:  90 tablet    Refill:  3   amLODipine  (NORVASC ) 5 MG tablet    Sig: Take 1 tablet (5 mg total) by mouth daily.    Dispense:  90 tablet    Refill:  3    Follow-up: Return if symptoms worsen or fail to improve.    Suzzane MARLA Blanch, MD

## 2024-06-30 NOTE — Patient Instructions (Signed)
 Please take Tylenol  arthritis as needed for shoulder pain.  Please get X-ray shoulder done at [facility].

## 2024-06-30 NOTE — Assessment & Plan Note (Signed)
 Continue omeprazole  20 mg QD Followed by GI

## 2024-06-30 NOTE — Assessment & Plan Note (Signed)
 Mild AC point tenderness, could be AC joint arthritis, worse due to recent exertion Tylenol  arthritis as needed for pain, prefers to avoid NSAID Heating pad as needed for pain Check x-ray of left shoulder

## 2024-06-30 NOTE — Assessment & Plan Note (Signed)
 DC tamsulosin  due to erectile dysfunction Symptoms manageable for now, he prefers to avoid medicine for now Check PSA

## 2024-06-30 NOTE — Assessment & Plan Note (Signed)
 Continue atorvastatin  10 mg QD Check lipid profile

## 2024-06-30 NOTE — Assessment & Plan Note (Signed)
 Lab Results  Component Value Date   HGBA1C 5.8 (H) 07/09/2023   Advised to follow DASH diet

## 2024-06-30 NOTE — Assessment & Plan Note (Addendum)
 BP Readings from Last 1 Encounters:  06/30/24 138/60   Well-controlled with amlodipine  5 mg QD and benazepril  10 mg QD Counseled for compliance with the medications Advised DASH diet and moderate exercise/walking as tolerated

## 2024-07-01 DIAGNOSIS — E782 Mixed hyperlipidemia: Secondary | ICD-10-CM | POA: Diagnosis not present

## 2024-07-01 DIAGNOSIS — E559 Vitamin D deficiency, unspecified: Secondary | ICD-10-CM | POA: Diagnosis not present

## 2024-07-01 DIAGNOSIS — N401 Enlarged prostate with lower urinary tract symptoms: Secondary | ICD-10-CM | POA: Diagnosis not present

## 2024-07-01 DIAGNOSIS — I10 Essential (primary) hypertension: Secondary | ICD-10-CM | POA: Diagnosis not present

## 2024-07-01 DIAGNOSIS — R351 Nocturia: Secondary | ICD-10-CM | POA: Diagnosis not present

## 2024-07-01 DIAGNOSIS — R7303 Prediabetes: Secondary | ICD-10-CM | POA: Diagnosis not present

## 2024-07-02 ENCOUNTER — Ambulatory Visit: Payer: Self-pay | Admitting: Internal Medicine

## 2024-07-02 LAB — CBC WITH DIFFERENTIAL/PLATELET
Basophils Absolute: 0.1 x10E3/uL (ref 0.0–0.2)
Basos: 1 %
EOS (ABSOLUTE): 0.1 x10E3/uL (ref 0.0–0.4)
Eos: 3 %
Hematocrit: 44.6 % (ref 37.5–51.0)
Hemoglobin: 14.5 g/dL (ref 13.0–17.7)
Immature Grans (Abs): 0 x10E3/uL (ref 0.0–0.1)
Immature Granulocytes: 0 %
Lymphocytes Absolute: 1.5 x10E3/uL (ref 0.7–3.1)
Lymphs: 31 %
MCH: 30.5 pg (ref 26.6–33.0)
MCHC: 32.5 g/dL (ref 31.5–35.7)
MCV: 94 fL (ref 79–97)
Monocytes Absolute: 0.5 x10E3/uL (ref 0.1–0.9)
Monocytes: 11 %
Neutrophils Absolute: 2.6 x10E3/uL (ref 1.4–7.0)
Neutrophils: 53 %
Platelets: 202 x10E3/uL (ref 150–450)
RBC: 4.76 x10E6/uL (ref 4.14–5.80)
RDW: 12.3 % (ref 11.6–15.4)
WBC: 4.8 x10E3/uL (ref 3.4–10.8)

## 2024-07-02 LAB — CMP14+EGFR
ALT: 30 IU/L (ref 0–44)
AST: 26 IU/L (ref 0–40)
Albumin: 4.5 g/dL (ref 3.8–4.8)
Alkaline Phosphatase: 86 IU/L (ref 47–123)
BUN/Creatinine Ratio: 14 (ref 10–24)
BUN: 12 mg/dL (ref 8–27)
Bilirubin Total: 0.6 mg/dL (ref 0.0–1.2)
CO2: 23 mmol/L (ref 20–29)
Calcium: 9.5 mg/dL (ref 8.6–10.2)
Chloride: 102 mmol/L (ref 96–106)
Creatinine, Ser: 0.87 mg/dL (ref 0.76–1.27)
Globulin, Total: 2.2 g/dL (ref 1.5–4.5)
Glucose: 98 mg/dL (ref 70–99)
Potassium: 4.3 mmol/L (ref 3.5–5.2)
Sodium: 141 mmol/L (ref 134–144)
Total Protein: 6.7 g/dL (ref 6.0–8.5)
eGFR: 87 mL/min/1.73 (ref >59)

## 2024-07-02 LAB — HEMOGLOBIN A1C
Est. average glucose Bld gHb Est-mCnc: 114 mg/dL
Hgb A1c MFr Bld: 5.6 % (ref 4.8–5.6)

## 2024-07-02 LAB — LIPID PANEL
Chol/HDL Ratio: 3 ratio (ref 0.0–5.0)
Cholesterol, Total: 169 mg/dL (ref 100–199)
HDL: 56 mg/dL (ref >39)
LDL Chol Calc (NIH): 87 mg/dL (ref 0–99)
Triglycerides: 149 mg/dL (ref 0–149)
VLDL Cholesterol Cal: 26 mg/dL (ref 5–40)

## 2024-07-02 LAB — PSA: Prostate Specific Ag, Serum: 2.4 ng/mL (ref 0.0–4.0)

## 2024-07-02 LAB — VITAMIN D 25 HYDROXY (VIT D DEFICIENCY, FRACTURES): Vit D, 25-Hydroxy: 25.4 ng/mL — ABNORMAL LOW (ref 30.0–100.0)

## 2024-07-02 LAB — TSH: TSH: 3.95 u[IU]/mL (ref 0.450–4.500)

## 2024-07-09 ENCOUNTER — Encounter

## 2024-07-10 ENCOUNTER — Other Ambulatory Visit: Payer: Self-pay | Admitting: Internal Medicine

## 2024-07-10 DIAGNOSIS — L570 Actinic keratosis: Secondary | ICD-10-CM | POA: Diagnosis not present

## 2024-07-10 DIAGNOSIS — K2270 Barrett's esophagus without dysplasia: Secondary | ICD-10-CM

## 2024-12-30 ENCOUNTER — Ambulatory Visit: Admitting: Internal Medicine
